# Patient Record
Sex: Female | Born: 1960
Health system: Southern US, Community
[De-identification: ages and names within clinical notes are randomized; demographics above are authoritative.]

## PROBLEM LIST (undated history)

## (undated) DIAGNOSIS — Z808 Family history of malignant neoplasm of other organs or systems: Secondary | ICD-10-CM

## (undated) DIAGNOSIS — T4145XA Adverse effect of unspecified anesthetic, initial encounter: Secondary | ICD-10-CM

## (undated) DIAGNOSIS — Z803 Family history of malignant neoplasm of breast: Secondary | ICD-10-CM

## (undated) DIAGNOSIS — F329 Major depressive disorder, single episode, unspecified: Secondary | ICD-10-CM

## (undated) DIAGNOSIS — N200 Calculus of kidney: Secondary | ICD-10-CM

## (undated) DIAGNOSIS — Z800 Family history of malignant neoplasm of digestive organs: Secondary | ICD-10-CM

## (undated) DIAGNOSIS — M26609 Unspecified temporomandibular joint disorder, unspecified side: Secondary | ICD-10-CM

## (undated) DIAGNOSIS — M8580 Other specified disorders of bone density and structure, unspecified site: Secondary | ICD-10-CM

## (undated) DIAGNOSIS — R200 Anesthesia of skin: Secondary | ICD-10-CM

## (undated) DIAGNOSIS — Z9889 Other specified postprocedural states: Secondary | ICD-10-CM

## (undated) DIAGNOSIS — C50919 Malignant neoplasm of unspecified site of unspecified female breast: Secondary | ICD-10-CM

## (undated) DIAGNOSIS — Z87442 Personal history of urinary calculi: Secondary | ICD-10-CM

## (undated) DIAGNOSIS — F411 Generalized anxiety disorder: Secondary | ICD-10-CM

## (undated) DIAGNOSIS — K5900 Constipation, unspecified: Secondary | ICD-10-CM

## (undated) DIAGNOSIS — T8859XA Other complications of anesthesia, initial encounter: Secondary | ICD-10-CM

## (undated) DIAGNOSIS — R112 Nausea with vomiting, unspecified: Secondary | ICD-10-CM

## (undated) DIAGNOSIS — M1990 Unspecified osteoarthritis, unspecified site: Secondary | ICD-10-CM

## (undated) DIAGNOSIS — R319 Hematuria, unspecified: Secondary | ICD-10-CM

## (undated) DIAGNOSIS — F909 Attention-deficit hyperactivity disorder, unspecified type: Secondary | ICD-10-CM

## (undated) HISTORY — DX: Calculus of kidney: N20.0

## (undated) HISTORY — PX: BREAST BIOPSY: SHX20

## (undated) HISTORY — DX: Unspecified osteoarthritis, unspecified site: M19.90

## (undated) HISTORY — DX: Family history of malignant neoplasm of breast: Z80.3

## (undated) HISTORY — PX: PELVIC LAPAROSCOPY: SHX162

## (undated) HISTORY — DX: Family history of malignant neoplasm of other organs or systems: Z80.8

## (undated) HISTORY — PX: CYSTOSCOPY: SUR368

## (undated) HISTORY — PX: LAPAROSCOPIC TOTAL HYSTERECTOMY: SUR800

## (undated) HISTORY — DX: Family history of malignant neoplasm of digestive organs: Z80.0

## (undated) HISTORY — DX: Other specified disorders of bone density and structure, unspecified site: M85.80

## (undated) HISTORY — DX: Major depressive disorder, single episode, unspecified: F32.9

## (undated) HISTORY — PX: COLPOSCOPY: SHX161

## (undated) HISTORY — DX: Attention-deficit hyperactivity disorder, unspecified type: F90.9

## (undated) HISTORY — DX: Unspecified temporomandibular joint disorder, unspecified side: M26.609

## (undated) HISTORY — DX: Malignant neoplasm of unspecified site of unspecified female breast: C50.919

## (undated) HISTORY — PX: GYNECOLOGIC CRYOSURGERY: SHX857

## (undated) HISTORY — PX: ABDOMINAL HYSTERECTOMY: SHX81

## (undated) HISTORY — DX: Hematuria, unspecified: R31.9

## (undated) HISTORY — DX: Generalized anxiety disorder: F41.1

---
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
## 1997-07-16 HISTORY — PX: AUGMENTATION MAMMAPLASTY: SUR837

## 1998-03-03 ENCOUNTER — Other Ambulatory Visit: Admission: RE | Admit: 1998-03-03 | Discharge: 1998-03-03 | Payer: Self-pay | Admitting: Obstetrics and Gynecology

## 1998-03-18 ENCOUNTER — Ambulatory Visit (HOSPITAL_COMMUNITY): Admission: RE | Admit: 1998-03-18 | Discharge: 1998-03-18 | Payer: Self-pay | Admitting: Internal Medicine

## 1999-04-17 ENCOUNTER — Other Ambulatory Visit: Admission: RE | Admit: 1999-04-17 | Discharge: 1999-04-17 | Payer: Self-pay | Admitting: Obstetrics and Gynecology

## 2000-07-19 ENCOUNTER — Emergency Department (HOSPITAL_COMMUNITY): Admission: EM | Admit: 2000-07-19 | Discharge: 2000-07-20 | Payer: Self-pay | Admitting: Emergency Medicine

## 2000-07-20 ENCOUNTER — Encounter: Payer: Self-pay | Admitting: Emergency Medicine

## 2001-02-11 ENCOUNTER — Other Ambulatory Visit: Admission: RE | Admit: 2001-02-11 | Discharge: 2001-02-11 | Payer: Self-pay | Admitting: Obstetrics and Gynecology

## 2002-03-24 ENCOUNTER — Other Ambulatory Visit: Admission: RE | Admit: 2002-03-24 | Discharge: 2002-03-24 | Payer: Self-pay | Admitting: Obstetrics and Gynecology

## 2003-05-10 ENCOUNTER — Other Ambulatory Visit: Admission: RE | Admit: 2003-05-10 | Discharge: 2003-05-10 | Payer: Self-pay | Admitting: Obstetrics and Gynecology

## 2006-07-16 HISTORY — PX: BREAST SURGERY: SHX581

## 2006-10-15 DIAGNOSIS — C50919 Malignant neoplasm of unspecified site of unspecified female breast: Secondary | ICD-10-CM

## 2006-10-15 HISTORY — DX: Malignant neoplasm of unspecified site of unspecified female breast: C50.919

## 2007-01-14 HISTORY — PX: MASTECTOMY: SHX3

## 2007-03-17 HISTORY — PX: TOTAL ABDOMINAL HYSTERECTOMY W/ BILATERAL SALPINGOOPHORECTOMY: SHX83

## 2008-04-27 ENCOUNTER — Ambulatory Visit: Payer: Self-pay | Admitting: Oncology

## 2008-05-18 LAB — CBC WITH DIFFERENTIAL/PLATELET
Basophils Absolute: 0 10*3/uL (ref 0.0–0.1)
Eosinophils Absolute: 0.1 10*3/uL (ref 0.0–0.5)
HCT: 39.6 % (ref 34.8–46.6)
LYMPH%: 37.1 % (ref 14.0–48.0)
MCV: 95.4 fL (ref 81.0–101.0)
MONO#: 0.4 10*3/uL (ref 0.1–0.9)
MONO%: 8.5 % (ref 0.0–13.0)
NEUT#: 2.5 10*3/uL (ref 1.5–6.5)
NEUT%: 52.6 % (ref 39.6–76.8)
Platelets: 270 10*3/uL (ref 145–400)
WBC: 4.8 10*3/uL (ref 3.9–10.0)

## 2008-05-19 LAB — COMPREHENSIVE METABOLIC PANEL
BUN: 12 mg/dL (ref 6–23)
CO2: 23 mEq/L (ref 19–32)
Creatinine, Ser: 0.66 mg/dL (ref 0.40–1.20)
Glucose, Bld: 94 mg/dL (ref 70–99)
Total Bilirubin: 0.4 mg/dL (ref 0.3–1.2)
Total Protein: 7.7 g/dL (ref 6.0–8.3)

## 2008-05-19 LAB — CANCER ANTIGEN 27.29: CA 27.29: 27 U/mL (ref 0–39)

## 2008-05-19 LAB — VITAMIN D 25 HYDROXY (VIT D DEFICIENCY, FRACTURES): Vit D, 25-Hydroxy: 42 ng/mL (ref 30–89)

## 2008-09-14 ENCOUNTER — Ambulatory Visit: Payer: Self-pay | Admitting: Oncology

## 2008-09-16 LAB — CBC WITH DIFFERENTIAL/PLATELET
Basophils Absolute: 0 10*3/uL (ref 0.0–0.1)
Eosinophils Absolute: 0 10*3/uL (ref 0.0–0.5)
HCT: 39.7 % (ref 34.8–46.6)
HGB: 13.7 g/dL (ref 11.6–15.9)
LYMPH%: 28.8 % (ref 14.0–49.7)
MCV: 95.2 fL (ref 79.5–101.0)
MONO#: 0.4 10*3/uL (ref 0.1–0.9)
NEUT#: 3.7 10*3/uL (ref 1.5–6.5)
NEUT%: 63.2 % (ref 38.4–76.8)
Platelets: 243 10*3/uL (ref 145–400)
RBC: 4.17 10*6/uL (ref 3.70–5.45)
WBC: 5.9 10*3/uL (ref 3.9–10.3)

## 2008-09-17 LAB — VITAMIN D 25 HYDROXY (VIT D DEFICIENCY, FRACTURES): Vit D, 25-Hydroxy: 28 ng/mL — ABNORMAL LOW (ref 30–89)

## 2008-09-17 LAB — COMPREHENSIVE METABOLIC PANEL
BUN: 17 mg/dL (ref 6–23)
CO2: 26 mEq/L (ref 19–32)
Glucose, Bld: 76 mg/dL (ref 70–99)
Sodium: 141 mEq/L (ref 135–145)
Total Bilirubin: 0.3 mg/dL (ref 0.3–1.2)
Total Protein: 7 g/dL (ref 6.0–8.3)

## 2008-09-17 LAB — CANCER ANTIGEN 27.29: CA 27.29: 25 U/mL (ref 0–39)

## 2009-02-03 ENCOUNTER — Ambulatory Visit: Payer: Self-pay | Admitting: Oncology

## 2009-12-07 ENCOUNTER — Encounter: Admission: RE | Admit: 2009-12-07 | Discharge: 2009-12-07 | Payer: Self-pay | Admitting: Oncology

## 2010-01-27 ENCOUNTER — Ambulatory Visit: Payer: Self-pay | Admitting: Oncology

## 2010-01-31 LAB — CBC WITH DIFFERENTIAL/PLATELET
BASO%: 0.3 % (ref 0.0–2.0)
Basophils Absolute: 0 10*3/uL (ref 0.0–0.1)
EOS%: 1 % (ref 0.0–7.0)
Eosinophils Absolute: 0 10*3/uL (ref 0.0–0.5)
HCT: 40.1 % (ref 34.8–46.6)
HGB: 13.4 g/dL (ref 11.6–15.9)
LYMPH%: 41.7 % (ref 14.0–49.7)
MCH: 32.6 pg (ref 25.1–34.0)
MCHC: 33.3 g/dL (ref 31.5–36.0)
MCV: 97.8 fL (ref 79.5–101.0)
MONO#: 0.4 10*3/uL (ref 0.1–0.9)
MONO%: 9.6 % (ref 0.0–14.0)
NEUT#: 2.1 10*3/uL (ref 1.5–6.5)
NEUT%: 47.4 % (ref 38.4–76.8)
Platelets: 227 10*3/uL (ref 145–400)
RBC: 4.1 10*6/uL (ref 3.70–5.45)
RDW: 13.6 % (ref 11.2–14.5)
WBC: 4.4 10*3/uL (ref 3.9–10.3)
lymph#: 1.8 10*3/uL (ref 0.9–3.3)

## 2010-02-01 LAB — COMPREHENSIVE METABOLIC PANEL
ALT: 15 U/L (ref 0–35)
AST: 22 U/L (ref 0–37)
Albumin: 4.5 g/dL (ref 3.5–5.2)
Alkaline Phosphatase: 40 U/L (ref 39–117)
BUN: 17 mg/dL (ref 6–23)
CO2: 23 mEq/L (ref 19–32)
Calcium: 10 mg/dL (ref 8.4–10.5)
Chloride: 105 mEq/L (ref 96–112)
Creatinine, Ser: 0.84 mg/dL (ref 0.40–1.20)
Glucose, Bld: 95 mg/dL (ref 70–99)
Potassium: 3.9 mEq/L (ref 3.5–5.3)
Sodium: 141 mEq/L (ref 135–145)
Total Bilirubin: 0.3 mg/dL (ref 0.3–1.2)
Total Protein: 7 g/dL (ref 6.0–8.3)

## 2010-02-01 LAB — CANCER ANTIGEN 27.29: CA 27.29: 16 U/mL (ref 0–39)

## 2010-02-01 LAB — VITAMIN D 25 HYDROXY (VIT D DEFICIENCY, FRACTURES): Vit D, 25-Hydroxy: 47 ng/mL (ref 30–89)

## 2010-03-10 DIAGNOSIS — Z9010 Acquired absence of unspecified breast and nipple: Secondary | ICD-10-CM | POA: Insufficient documentation

## 2010-03-10 DIAGNOSIS — C50919 Malignant neoplasm of unspecified site of unspecified female breast: Secondary | ICD-10-CM | POA: Insufficient documentation

## 2010-03-10 DIAGNOSIS — R002 Palpitations: Secondary | ICD-10-CM | POA: Insufficient documentation

## 2010-03-10 DIAGNOSIS — M19049 Primary osteoarthritis, unspecified hand: Secondary | ICD-10-CM | POA: Insufficient documentation

## 2010-03-17 ENCOUNTER — Ambulatory Visit: Payer: Self-pay | Admitting: Cardiology

## 2010-03-17 DIAGNOSIS — R079 Chest pain, unspecified: Secondary | ICD-10-CM | POA: Insufficient documentation

## 2010-03-17 DIAGNOSIS — I440 Atrioventricular block, first degree: Secondary | ICD-10-CM | POA: Insufficient documentation

## 2010-04-03 ENCOUNTER — Ambulatory Visit: Payer: Self-pay | Admitting: Internal Medicine

## 2010-04-03 ENCOUNTER — Ambulatory Visit (HOSPITAL_COMMUNITY): Admission: RE | Admit: 2010-04-03 | Discharge: 2010-04-03 | Payer: Self-pay | Admitting: Cardiology

## 2010-04-03 ENCOUNTER — Encounter: Payer: Self-pay | Admitting: Cardiology

## 2010-04-03 ENCOUNTER — Ambulatory Visit: Payer: Self-pay

## 2010-08-17 NOTE — Assessment & Plan Note (Signed)
Summary: np6 / palps. pt has inclusive health./ gd   Visit Type:  new pt visit Primary Provider:  [facility]  CC:  pt states she has had some recent chest pain/pressure...sob....denies any edema.  History of Present Illness: Kristen Gamble consent today for evaluation of some palpitations described as flutters and some chest discomfort.  She is referred by Dr. Darnelle Catalan.   She also complains of excess fatigue.  Her chest discomfort is typically nonexertional a when she is exercising she will feel a discomfort in her left breast. There are no associated symptoms of nausea, vomiting, radiation the discomfort, presyncope, syncope, or diaphoresis.  She's been a lot of stress with 4 children and also diagnosed with breast cancer several years ago. She exercises on a regular basis. She has no limitations exercise.  There is no history of diabetes, hypertension, or premature family history of coronary disease. Her cholesterol is 181, HDL 83, LDL 84 triglycerides 39.  The palpitations are intermittent and mostly at night or rest. They're not associated with exertion. There is no particular pattern and they may last seconds to minutes. No associated symptoms. Her TSH was normal in February.    Preventive Screening-Counseling & Management  Alcohol-Tobacco     Smoking Status: never  Current Medications (verified): 1)  Tamoxifen Citrate 20 Mg Tabs (Tamoxifen Citrate) .Marland Kitchen.. 1 Tab Once Daily 2)  Gabapentin 300 Mg Caps (Gabapentin) .Marland Kitchen.. 1 Cap At Bedtime 3)  Miralax  Powd (Polyethylene Glycol 3350) .Marland KitchenMarland KitchenMarland Kitchen 17 Gm Once Daily 4)  Vitamin D3 1000 Unit Tabs (Cholecalciferol) .Marland Kitchen.. 1 Tab Once Daily 5)  Calcium 600 Mg Tabs (Calcium) .Marland Kitchen.. 1 Tab Two Times A Day  Allergies (verified): 1)  ! Morphine  Past History:  Past Medical History: Last updated: 03/10/2010 Right breast cancer history..T1c N0, grade 1 Invasive ductal carcinoma, ER and PR strongly positive, HER2/neu negative, Ocotype DX recurrent  score of 13, predicting an 8% or so risk of recurrence after 5 yrs of Tamoxifen Bilateral Mastecomies...2008 Subpectoral implants...2008 Arthritis hands  Family History: Last updated: 03/17/2010 Family History or Thyroid problems Father: deceased @ 84...alcoholic-pneumonia Family History of Alcoholism: Father Family History of Coronary Artery Disease: PGF.Marland Kitchendeceased @ 68 due to MI...MGM MI at 64  Social History: Last updated: 03/17/2010 Alcohol Use - yes...4 drinks per week Regular Exercise - yes Drug Use - no 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  Smoking Status:  never  Review of Systems       negative other than history of present illness  Vital Signs:  Patient profile:   50 year old female Height:      63.5 inches Weight:      122.8 pounds BMI:     21.49 Pulse rate:   57 / minute Pulse rhythm:   irregular BP sitting:   100 / 70  (left arm) Cuff size:   large  Vitals Entered By: Danielle Rankin, CMA (  March 17, 2010 10:35 AM)  Physical Exam  General:  Well developed, well nourished, in no acute distress. Head:  normocephalic and atraumatic Eyes:  PERRLA/EOM intact; conjunctiva and lids normal. Neck:  Neck supple, no JVD. No masses, thyromegaly or abnormal cervical nodes. Chest Wall:  no  deformities or breast masses noted Lungs:  Clear bilaterally to auscultation and percussion. Heart:  PMI nondisplaced, normal S1-S2, regular rate and rhythm, no click or murmur, carotids equal bilaterally without bruits Abdomen:  Bowel sounds positive; abdomen soft and non-tender without masses, organomegaly, or hernias noted. No hepatosplenomegaly. Msk:  Back normal, normal gait. Muscle strength and tone normal. Pulses:  pulses normal in all 4 extremities Extremities:  No clubbing or cyanosis. Neurologic:  Alert and oriented x 3. Skin:  Intact without lesions or rashes. Psych:  Normal affect.   Problems:  Medical Problems Added: 1)  Dx of Chest Pain-unspecified  (ICD-786.50) 2)  Dx of Av Block, 1st Degree  (ICD-426.11)  EKG  Procedure date:  03/17/2010  Findings:      sinus bradycardia, first-degree AV block, no other changes.  Impression & Recommendations:  Problem # 1:  PALPITATIONS (ICD-785.1) Assessment New I feel these are most likely benign due to PACs or occasional PVCs. TSH and electrolytes are normal. Will check echocardiogram to rule out a structural heart disease. I suspect this will be normal as well. Reassurance given. Orders: EKG w/ Interpretation (93000) Echocardiogram (Echo)  Problem # 2:  CHEST PAIN-UNSPECIFIED (ICD-786.50) Assessment: New I do not feel this is coronary or cardiac related. Symptoms of angina or ischemia reviewed with the patient. Her risk of having a coronary event in the next 10 years is around 1% per global risk score. Data shared with the patient. Reassurance given.  Problem # 3:  AV BLOCK, 1ST DEGREE (ICD-426.11) Assessment: New This is an incidental finding. Patient made aware of no significant concern. Orders: EKG w/ Interpretation (93000)  Patient Instructions: 1)  Your physician recommends that you schedule a follow-up appointment in: as needed with Dr. Daleen Squibb 2)  Your physician recommends that you continue on your current  medications as directed. Please refer to the Current Medication list given to you today. 3)  Your physician has requested that you have an echocardiogram.  Echocardiography is a painless test that uses sound waves to create images of your heart. It provides your doctor with information about the size and shape of your heart and how well your heart's chambers and valves are working.  This procedure takes approximately one hour. There are no restrictions for this procedure.

## 2011-02-01 ENCOUNTER — Encounter (HOSPITAL_BASED_OUTPATIENT_CLINIC_OR_DEPARTMENT_OTHER): Payer: PRIVATE HEALTH INSURANCE | Admitting: Oncology

## 2011-02-01 ENCOUNTER — Other Ambulatory Visit: Payer: Self-pay | Admitting: Oncology

## 2011-02-01 DIAGNOSIS — Z170 Estrogen receptor positive status [ER+]: Secondary | ICD-10-CM

## 2011-02-01 DIAGNOSIS — C50919 Malignant neoplasm of unspecified site of unspecified female breast: Secondary | ICD-10-CM

## 2011-02-01 DIAGNOSIS — M899 Disorder of bone, unspecified: Secondary | ICD-10-CM

## 2011-02-01 LAB — CBC WITH DIFFERENTIAL/PLATELET
BASO%: 0.4 % (ref 0.0–2.0)
Basophils Absolute: 0 10*3/uL (ref 0.0–0.1)
EOS%: 1.3 % (ref 0.0–7.0)
Eosinophils Absolute: 0.1 10*3/uL (ref 0.0–0.5)
HCT: 40.5 % (ref 34.8–46.6)
HGB: 13.8 g/dL (ref 11.6–15.9)
LYMPH%: 36.2 % (ref 14.0–49.7)
MCH: 32.5 pg (ref 25.1–34.0)
MCHC: 34.1 g/dL (ref 31.5–36.0)
MCV: 95.4 fL (ref 79.5–101.0)
MONO#: 0.3 10*3/uL (ref 0.1–0.9)
MONO%: 7.3 % (ref 0.0–14.0)
NEUT#: 2.3 10*3/uL (ref 1.5–6.5)
NEUT%: 54.8 % (ref 38.4–76.8)
Platelets: 225 10*3/uL (ref 145–400)
RBC: 4.24 10*6/uL (ref 3.70–5.45)
RDW: 13.2 % (ref 11.2–14.5)
WBC: 4.2 10*3/uL (ref 3.9–10.3)
lymph#: 1.5 10*3/uL (ref 0.9–3.3)

## 2011-02-02 LAB — CANCER ANTIGEN 27.29: CA 27.29: 24 U/mL (ref 0–39)

## 2011-02-02 LAB — COMPREHENSIVE METABOLIC PANEL
ALT: 20 U/L (ref 0–35)
AST: 21 U/L (ref 0–37)
Alkaline Phosphatase: 64 U/L (ref 39–117)
CO2: 27 mEq/L (ref 19–32)
Creatinine, Ser: 0.8 mg/dL (ref 0.50–1.10)
Total Bilirubin: 0.4 mg/dL (ref 0.3–1.2)

## 2011-02-08 ENCOUNTER — Encounter (HOSPITAL_BASED_OUTPATIENT_CLINIC_OR_DEPARTMENT_OTHER): Payer: PRIVATE HEALTH INSURANCE | Admitting: Oncology

## 2011-02-08 DIAGNOSIS — C50919 Malignant neoplasm of unspecified site of unspecified female breast: Secondary | ICD-10-CM

## 2011-02-08 DIAGNOSIS — Z170 Estrogen receptor positive status [ER+]: Secondary | ICD-10-CM

## 2011-06-23 ENCOUNTER — Telehealth: Payer: Self-pay | Admitting: Oncology

## 2011-06-23 NOTE — Telephone Encounter (Signed)
called pts home daughter answered phone and stated that mother was not there.  will mail [phone] appts to her home on 06/23/2011

## 2011-08-13 ENCOUNTER — Encounter: Payer: Self-pay | Admitting: Gynecology

## 2011-08-13 ENCOUNTER — Other Ambulatory Visit: Payer: Self-pay | Admitting: Gynecology

## 2011-08-13 ENCOUNTER — Other Ambulatory Visit (HOSPITAL_COMMUNITY)
Admission: RE | Admit: 2011-08-13 | Discharge: 2011-08-13 | Disposition: A | Discharge status: Home / Self Care | Payer: PRIVATE HEALTH INSURANCE | Source: Ambulatory Visit | Attending: Gynecology | Admitting: Gynecology

## 2011-08-13 ENCOUNTER — Ambulatory Visit (INDEPENDENT_AMBULATORY_CARE_PROVIDER_SITE_OTHER): Payer: PRIVATE HEALTH INSURANCE | Admitting: Gynecology

## 2011-08-13 VITALS — BP 114/60 | Ht 63.5 in | Wt 130.0 lb

## 2011-08-13 DIAGNOSIS — Z01419 Encounter for gynecological examination (general) (routine) without abnormal findings: Secondary | ICD-10-CM | POA: Insufficient documentation

## 2011-08-13 DIAGNOSIS — C801 Malignant (primary) neoplasm, unspecified: Secondary | ICD-10-CM | POA: Insufficient documentation

## 2011-08-13 LAB — URINALYSIS W MICROSCOPIC + REFLEX CULTURE
Bilirubin Urine: NEGATIVE
Crystals: NONE SEEN
Glucose, UA: NEGATIVE mg/dL
Specific Gravity, Urine: 1.015 (ref 1.005–1.030)
Urobilinogen, UA: 0.2 mg/dL (ref 0.0–1.0)

## 2011-08-13 NOTE — Patient Instructions (Signed)
Follow up for GYN exam in 1 year. Call if you want to try Effexor for hot flashes.

## 2011-08-13 NOTE — Progress Notes (Signed)
Kristen Gamble [birth year] [id number]        51 y.o.  New patient for annual exam.  Several issues noted below.  Past medical history,surgical history, medications, allergies, family history and social history were all reviewed and documented in the EPIC chart. ROS:  Was performed and pertinent positives and negatives are included in the history.  Exam: Kim chaperone present Filed Vitals:   08/13/11 1117  BP: 114/60   General appearance  Normal Skin grossly normal Head/Neck normal with no cervical or supraclavicular adenopathy thyroid normal Lungs  clear Cardiac RR, without RMG Abdominal  soft, nontender, without masses, organomegaly or hernia Chest wall  examined lying and sitting.   Well-healed reconstruction scars.  Without masses, skin changes or axillary adenopathy.   Pelvic  Ext/BUS/vagina  Normal with mild atrophic changes.  Pap of cuff done  Adnexa  Without masses or tenderness    Anus and perineum  normal   Rectovaginal  normal sphincter tone without palpated masses or tenderness.    Assessment/Plan:  51 y.o. female for annual exam.    1. History of adenocarcinoma the right breast status post bilateral mastectomy. Stage I historically, estrogen progesterone receptor positive. Had been on tamoxifen and aromatase inhibitors but now off of these do to side effect profile. She is actively being followed by Dr. Darnelle Catalan and will continue to see him. 2. History of dysplasia. Cryo- surgery age 35 with normal Pap smears since then. Review of her records show normal sequential Pap smears with the last one in 2011. She is status post TAH/BSO as part of a risk reduction surgery and I reviewed current screening guidelines as far as stopping after hysterectomy or doing a less frequent interval. Patient asked for a Pap smear this year and we'll rediscuss less frequent screening in the future. 3. Menopausal symptoms. Patient is having hot flashes and sweats. She's doing all right as far as vaginal  lubrication with intercourse. She is estrogen and progesterone positive on her breast cancer. Options for management were reviewed and I discussed possible Effexor. She wants to think about this and she'll let me know if she wants to try this. 4. Osteopenia. She is known to have osteopenia on bone density through Dr. Darrall Dears office. She'll continue to follow up with him in reference to this. 5. Colonoscopy. She had a colonoscopy at age 27 due to some GI complaints which was negative. She plans on repeat after she turns 51 this coming year. 6. Health maintenance. No blood work was done today as is all done through her primary's office. I did check a urinalysis. Assuming she continues well then she will see me in a year or sooner if she wants to try Effexor.    Dara Lords MD, 12:25 PM 08/13/2011

## 2011-08-15 LAB — URINE CULTURE: Organism ID, Bacteria: NO GROWTH

## 2011-08-17 DIAGNOSIS — F909 Attention-deficit hyperactivity disorder, unspecified type: Secondary | ICD-10-CM

## 2011-08-17 HISTORY — DX: Attention-deficit hyperactivity disorder, unspecified type: F90.9

## 2011-08-22 ENCOUNTER — Other Ambulatory Visit: Payer: Self-pay | Admitting: *Deleted

## 2011-08-22 MED ORDER — TAMOXIFEN CITRATE 20 MG PO TABS: 20.0000 mg | ORAL_TABLET | Freq: Every day | ORAL | Status: DC

## 2011-08-31 ENCOUNTER — Encounter: Payer: Self-pay | Admitting: *Deleted

## 2011-08-31 NOTE — Progress Notes (Unsigned)
Pt reports that she has recently started to take Tamoxifen again and after a 3 days has noted a "itchy rash" on her neck. Pt states that she took Tamoxifen a couple of years ago with no problems. Pt denies SOB, and fever. Pt was instructed to either stop the Tamoxifen and evaluate sx after a few days or she may take benadryl for "itchy rash" if sx do not improve by Monday 09/03/11 pt will call this desk

## 2011-09-03 ENCOUNTER — Encounter: Payer: PRIVATE HEALTH INSURANCE | Admitting: Family Medicine

## 2011-09-17 ENCOUNTER — Encounter: Payer: Self-pay | Admitting: Family Medicine

## 2011-09-17 ENCOUNTER — Ambulatory Visit (INDEPENDENT_AMBULATORY_CARE_PROVIDER_SITE_OTHER): Payer: PRIVATE HEALTH INSURANCE | Admitting: Family Medicine

## 2011-09-17 VITALS — BP 102/62 | HR 68 | Ht 63.25 in | Wt 127.0 lb

## 2011-09-17 DIAGNOSIS — Z0000 Encounter for general adult medical examination without abnormal findings: Secondary | ICD-10-CM

## 2011-09-17 DIAGNOSIS — R232 Flushing: Secondary | ICD-10-CM | POA: Insufficient documentation

## 2011-09-17 DIAGNOSIS — C50919 Malignant neoplasm of unspecified site of unspecified female breast: Secondary | ICD-10-CM

## 2011-09-17 DIAGNOSIS — E7800 Pure hypercholesterolemia, unspecified: Secondary | ICD-10-CM

## 2011-09-17 DIAGNOSIS — J309 Allergic rhinitis, unspecified: Secondary | ICD-10-CM | POA: Insufficient documentation

## 2011-09-17 DIAGNOSIS — N951 Menopausal and female climacteric states: Secondary | ICD-10-CM

## 2011-09-17 LAB — LIPID PANEL
Cholesterol: 220 mg/dL — ABNORMAL HIGH (ref 0–200)
HDL: 94 mg/dL (ref >39)
LDL Cholesterol: 116 mg/dL — ABNORMAL HIGH (ref 0–99)
Triglycerides: 51 mg/dL (ref <150)

## 2011-09-17 LAB — POCT URINALYSIS DIPSTICK
Bilirubin, UA: NEGATIVE
Glucose, UA: NEGATIVE
Ketones, UA: NEGATIVE
Leukocytes, UA: NEGATIVE
Nitrite, UA: NEGATIVE
pH, UA: 7

## 2011-09-17 MED ORDER — VENLAFAXINE HCL ER 37.5 MG PO CP24: ORAL_CAPSULE | ORAL | Status: DC

## 2011-09-17 NOTE — Progress Notes (Signed)
Kristen Gamble is a 51 y.o. female who presents for a complete physical.  She has the following concerns:  Recently had evaluation by psychologist Caryl Never) showing ADHD, and recommended that she see Dr. Merla Riches for possible medications. She finds that she focuses better if she has caffeine or a decongestant such as sudafed.  Has seasonal allergies, and they have been flaring.  Runny nose, occasional sinus pressure. Reports sometimes having trouble sleeping, mainly related to hot flashes, and some hormonal changes.  Had eval by cardiologist for some chest pain, flutters.  Had normal workup, was felt to be due to Tamoxifen.  Symptoms are mild.  Had been off Tamoxifen for 18 months, and recently restarted taking daily, and tolerating so far.  Health Maintenance:  There is no immunization history on file for this patient. Believes last tetanus was >5 years ago, and not TdaP; more likely 8-9 years ago Last Pap smear: with GYN last month Last mammogram: n/a since mastectomy, has had u/s with lump evals Last colonoscopy: 2008, rec repeat in 5 years, plans for age 71 (later this year); Dr. Matthias Hughs Last DEXA: 03/2011, osteopenia Dentist: twice yearly Ophtho: once yearly Exercise: 5-7 days/week, weights 2x/week  Past Medical History  Diagnosis Date  . Breast cancer 10/2006    left  . Cervical dysplasia   . Osteopenia   . ADHD (attention deficit hyperactivity disorder) 08/2011    Past Surgical History  Procedure Date  . Colposcopy   . Gynecologic cryosurgery   . Breast surgery 2008    Lumpectomy-Bilateral mastectomy-implants  . Augmentation mammaplasty 1999  . Total abdominal hysterectomy w/ bilateral salpingoophorectomy 9/08  . Tonsillectomy 1971  . Breast biopsy     x4 in 2009-2012, all benign    History   Social History  . Marital Status: Married    Spouse Name: N/A    Number of Children: 4  . Years of Education: N/A   Occupational History  . 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 Smoking status: Never Smoker   . Smokeless tobacco: Never Used  . Alcohol Use: 2.5 oz/week    5 drink(s) per week     1 glass of wine 4-5 times week.  . Drug Use: No  . Sexually Active: Yes -- Female partner(s)    Birth Control/ Protection: Surgical   Other Topics Concern  . Not on file   Social History Narrative   Lives with her husband, 53 yo daughter and 79 year old twins, 2 dogs, 2 birds    Family History  Problem Relation Age of Onset  . Diabetes Father   . Cancer Father     stomach  . Alcohol abuse Father   . Depression Father   . Ulcers Father   . Diabetes Maternal Grandmother   . Hypertension Maternal Grandmother   . Heart attack Maternal Grandmother   . Heart disease Maternal Grandmother   . Hypothyroidism Maternal Grandmother   . Hypertension Maternal Grandfather   . Stroke Maternal Grandfather   . Asthma Maternal Grandfather   . Colon polyps Maternal Grandfather   . Heart attack Paternal Grandfather   . Heart disease Paternal Grandfather 65    died of MI  . Arthritis Mother   . Hypothyroidism Mother    Current Outpatient Prescriptions on File Prior to Visit  Medication Sig Dispense Refill  . Polyethylene Glycol 3350 (MIRALAX PO) Take by mouth daily.       Tamoxifen, Viactiv BID  Allergies  Allergen Reactions  . Morphine     REACTION: itching   ROS:  The patient denies anorexia, fever, weight changes, headaches,  vision changes, decreased hearing, ear pain, sore throat, breast concerns, chest pain, palpitations, dizziness, syncope, dyspnea on exertion, cough, swelling, nausea, vomiting, diarrhea, constipation, abdominal pain, melena, hematochezia, indigestion/heartburn, hematuria, incontinence, dysuria, irregular menstrual cycles, vaginal discharge, odor or itch, genital lesions, joint pains, numbness, tingling, weakness, tremor, suspicious skin lesions, depression, anxiety, abnormal bleeding/bruising, or enlarged lymph  nodes.  +hot flashes at night. +concentration problems.  Periodically has been evaluated for lumps in scars from breast surgery, had u/s and biopsy, benign.  PHYSICAL EXAM: BP 102/62  Pulse 68  Ht 5' 3.25" (1.607 m)  Wt 127 lb (57.607 kg)  BMI 22.32 kg/m2  General Appearance:    Alert, cooperative, no distress, appears stated age  Head:    Normocephalic, without obvious abnormality, atraumatic  Eyes:    PERRL, conjunctiva/corneas clear, EOM's intact, fundi    benign  Ears:    Normal TM's and external ear canals  Nose:   Nares normal, mucosa mildly edematous with clear mucus, recent bleeding from L nare, no sinus tenderness  Throat:   Lips, mucosa, and tongue normal; teeth and gums normal  Neck:   Supple, no lymphadenopathy;  thyroid:  no   enlargement/tenderness/nodules; no carotid   bruit or JVD  Back:    Spine nontender, no curvature, ROM normal, no CVA     tenderness  Lungs:     Clear to auscultation bilaterally without wheezes, rales or     ronchi; respirations unlabored  Chest Wall:    No tenderness or deformity   Heart:    Regular rate and rhythm, S1 and S2 normal, no murmur, rub   or gallop  Breast Exam:    Deferred to GYN  Abdomen:     Soft, non-tender, nondistended, normoactive bowel sounds,    no masses, no hepatosplenomegaly  Genitalia:    Deferred to GYN     Extremities:   No clubbing, cyanosis or edema  Pulses:   2+ and symmetric all extremities  Skin:   Skin color, texture, turgor normal, no rashes or lesions  Lymph nodes:   Cervical, supraclavicular, and axillary nodes normal  Neurologic:   CNII-XII intact, normal strength, sensation and gait; reflexes 2+ and symmetric throughout          Psych:   Normal mood, affect, hygiene and grooming.     ASSESSMENT/PLAN: 1. Routine general medical examination at a health care facility  POCT Urinalysis Dipstick, Visual acuity screening, Glucose, random  2. Hot flashes  TSH, venlafaxine (EFFEXOR XR) 37.5 MG 24 hr capsule    3. Allergic rhinitis, cause unspecified    4. Pure hypercholesterolemia  Lipid panel  5. ADENOCARCINOMA, RIGHT BREAST     Hot flashes due to meds (Tamoxifen) Allergies--may contribute to concentration difficulties ADHD--recently determined by psychologist.  Has other factors affecting her ability to concentrate (sleep, hot flashes, allergies), so avoid stimulant medications for now Insomnia--related to hot flashes  Trial of treating hot flashes and allergies, and getting better sleep, to see if attention/concentration improves.  If still having trouble focusing, consider adding stimulant medication to treat ADHD.  Effexor 37.5 mg x 1-2 weeks, increase to 75 mg if tolerating and still having hot flashes.  Try melatonin or benadryl (diphenhydramine--ie in Simply Sleep) to see if improving your sleep improves concentration, versus restarting gabapentin, which has helped you sleep in the past.  Treating allergies will also help with concentration.  Start with Zyrtec or Claritin (you may use the D version if the plain doesn't adequately treat the congestion or sinus pressure). If you are sensitive to decongestants with your sleep, then do NOT take combination, but take a separate, shorter-acting decongestant only as needed.   Recommended at least 30 minutes of aerobic activity at least 5 days/week; proper sunscreen use reviewed; healthy diet, including goals of calcium and vitamin D intake and alcohol recommendations (less than or equal to 1 drink/day) reviewed; regular seatbelt use; changing batteries in smoke detectors.  Immunization recommendations discussed--TdaP given.  Colonoscopy recommendations reviewed--repeat due again this year, age 13  Labs/chart reviewed--chem in July wasn't fasting, normal vit D in July  TSH, fasting glucose, lipid  F/u 1 month

## 2011-09-17 NOTE — Patient Instructions (Addendum)
HEALTH MAINTENANCE RECOMMENDATIONS:  It is recommended that you get at least 30 minutes of aerobic exercise at least 5 days/week (for weight loss, you may need as much as 60-90 minutes). This can be any activity that gets your heart rate up. This can be divided in 10-15 minute intervals if needed, but try and build up your endurance at least once a week.  Weight bearing exercise is also recommended twice weekly.  Eat a healthy diet with lots of vegetables, fruits and fiber.  "Colorful" foods have a lot of vitamins (ie green vegetables, tomatoes, red peppers, etc).  Limit sweet tea, regular sodas and alcoholic beverages, all of which has a lot of calories and sugar.  Up to 1 alcoholic drink daily may be beneficial for women (unless trying to lose weight, watch sugars).  Drink a lot of water.  Calcium recommendations are 1200-1500 mg daily (1500 mg for postmenopausal women or women without ovaries), and vitamin D 1000 IU daily.  This should be obtained from diet and/or supplements (vitamins), and calcium should not be taken all at once, but in divided doses.  Monthly self breast exams and yearly mammograms for women over the age of 25 is recommended.  Sunscreen of at least SPF 30 should be used on all sun-exposed parts of the skin when outside between the hours of 10 am and 4 pm (not just when at beach or pool, but even with exercise, golf, tennis, and yard work!)  Use a sunscreen that says "broad spectrum" so it covers both UVA and UVB rays, and make sure to reapply every 1-2 hours.  Remember to change the batteries in your smoke detectors when changing your clock times in the spring and fall.  Use your seat belt every time you are in a car, and please drive safely and not be distracted with cell phones and texting while driving.   Trial of treating hot flashes and allergies, and getting better sleep, to see if attention/concentration improves.  If still having trouble focusing, consider adding  stimulant medication to treat ADHD.  Effexor 37.5 mg x 1-2 weeks, increase to 75 mg if tolerating and still having hot flashes.  Try melatonin or benadryl (diphenhydramine--ie in Simply Sleep) to see if improving your sleep improves concentration, versus restarting gabapentin, which has helped you sleep in the past.  Treating allergies will also help with concentration.  Start with Zyrtec or Claritin (you may use the D version if the plain doesn't adequately treat the congestion or sinus pressure). If you are sensitive to decongestants with your sleep, then do NOT take combination, but take a separate, shorter-acting decongestant only as needed.

## 2011-09-18 ENCOUNTER — Encounter: Payer: Self-pay | Admitting: Family Medicine

## 2011-09-19 ENCOUNTER — Ambulatory Visit: Payer: Self-pay | Admitting: Internal Medicine

## 2011-09-28 ENCOUNTER — Telehealth: Payer: Self-pay | Admitting: Family Medicine

## 2011-10-01 NOTE — Telephone Encounter (Signed)
DONE

## 2011-10-22 ENCOUNTER — Encounter: Payer: Self-pay | Admitting: Family Medicine

## 2011-10-22 ENCOUNTER — Ambulatory Visit (INDEPENDENT_AMBULATORY_CARE_PROVIDER_SITE_OTHER): Payer: PRIVATE HEALTH INSURANCE | Admitting: Family Medicine

## 2011-10-22 VITALS — BP 88/52 | HR 72 | Ht 63.25 in | Wt 124.0 lb

## 2011-10-22 DIAGNOSIS — F909 Attention-deficit hyperactivity disorder, unspecified type: Secondary | ICD-10-CM | POA: Insufficient documentation

## 2011-10-22 DIAGNOSIS — M26609 Unspecified temporomandibular joint disorder, unspecified side: Secondary | ICD-10-CM | POA: Insufficient documentation

## 2011-10-22 DIAGNOSIS — Z23 Encounter for immunization: Secondary | ICD-10-CM

## 2011-10-22 DIAGNOSIS — J309 Allergic rhinitis, unspecified: Secondary | ICD-10-CM

## 2011-10-22 DIAGNOSIS — R232 Flushing: Secondary | ICD-10-CM

## 2011-10-22 DIAGNOSIS — N951 Menopausal and female climacteric states: Secondary | ICD-10-CM

## 2011-10-22 MED ORDER — VENLAFAXINE HCL ER 75 MG PO CP24: 75.0000 mg | ORAL_CAPSULE | Freq: Every day | ORAL | Status: DC

## 2011-10-22 MED ORDER — FLUTICASONE PROPIONATE 50 MCG/ACT NA SUSP: 2.0000 | Freq: Every day | NASAL | Status: DC

## 2011-10-22 NOTE — Patient Instructions (Signed)
I have changed your Effexor to the 75 mg so that you only take 1 capsule daily. Consider trying different antihistamines (zyrtec vs claritin vs allegra), and you may use the D version or take a separate sudafed if needed for significant congestion or sinus pain.

## 2011-10-22 NOTE — Progress Notes (Signed)
Patient presents for 1 month follow up.  At her recent CPE we discussed her difficulty concentrating, which was felt to be contributed by allergies, poor sleep (related to hot flashes), and possible ADD (she had been recently diagnosed).  She was started on Effexor at last visit, as well as OTC allergy medications, and presents for follow up.  She is sleeping much better, although still having some hot flashes.  Feels less irritable.  Concentration is much better.  Allegra helps with allergies, but still having some congestion, postnasal drainage.    Slight upset stomach when she first started the Effexor, but no problems since. Had a headache one day when she took it later.  Recommended TdaP at last visit, but it wasn't given, so will get it today.  Hand swelled recently from a bee sting at a soccer game.  Didn't have any shortness of breath, tongue swelling.  Has had multiple bee stings, localized swelling only, although slightly worse with last sting.  TMJ--rx'd ibuprofen, and wears bite guard, and has had PT, doing home exercises.  Having a flare now, has been well controlled with PT in the past.  Past Medical History  Diagnosis Date  . Breast cancer 10/2006    left  . Cervical dysplasia   . Osteopenia   . ADHD (attention deficit hyperactivity disorder) 08/2011    Past Surgical History  Procedure Date  . Colposcopy   . Gynecologic cryosurgery   . Breast surgery 2008    Lumpectomy-Bilateral mastectomy-implants  . Augmentation mammaplasty 1999  . Total abdominal hysterectomy w/ bilateral salpingoophorectomy 9/08  . Tonsillectomy 1971  . Breast biopsy     x4 in 2009-2012, all benign    History   Social History  . Marital Status: Married    Spouse Name: N/A    Number of Children: 4  . 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 Smoking status: Never Smoker   . Smokeless tobacco: Never Used  . Alcohol  Use: 2.5 oz/week    5 drink(s) per week     1 glass of wine 4-5 times week.  . Drug Use: No  . Sexually Active: Yes -- Female partner(s)    Birth Control/ Protection: Surgical   Other Topics Concern  . Not on file   Social History Narrative   Lives with her husband, 34 yo daughter and 2 year old twins, 2 dogs, 2 birds    Family History  Problem Relation Age of Onset  . Diabetes Father   . Cancer Father     stomach  . Alcohol abuse Father   . Depression Father   . Ulcers Father   . Diabetes Maternal Grandmother   . Hypertension Maternal Grandmother   . Heart attack Maternal Grandmother   . Heart disease Maternal Grandmother   . Hypothyroidism Maternal Grandmother   . Hypertension Maternal Grandfather   . Stroke Maternal Grandfather   . Asthma Maternal Grandfather   . Colon polyps Maternal Grandfather   . Heart attack Paternal Grandfather   . Heart disease Paternal Grandfather 57    died of MI  . Arthritis Mother   . Hypothyroidism Mother    Current Outpatient Prescriptions on File Prior to Visit  Medication Sig Dispense Refill  . fexofenadine (ALLEGRA) 180 MG tablet Take 180 mg by mouth daily.      . Multiple Vitamins-Calcium (VIACTIV MULTI-VITAMIN) CHEW Chew 2 each by  mouth daily.      . Polyethylene Glycol 3350 (MIRALAX PO) Take by mouth daily.       . tamoxifen (NOLVADEX) 10 MG tablet Take 10 mg by mouth daily.      Marland Kitchen DISCONTD: venlafaxine (EFFEXOR XR) 37.5 MG 24 hr capsule Take one capsule by mouth daily for 1 (to 2) weeks. Increase to 2 capsules if tolerating, and still having hot flashes  60 capsule  1  . fluticasone (FLONASE) 50 MCG/ACT nasal spray Place 2 sprays into the nose daily.  16 g  6    Allergies  Allergen Reactions  . Morphine     REACTION: itching   ROS:  Denies fevers, URI symptoms, sinus pain, nausea, vomiting, significant headaches, skin rash.  See HPI for other concerns.  PHYSICAL EXAM: BP 88/52  Pulse 72  Ht 5' 3.25" (1.607 m)  Wt 124 lb  (56.246 kg)  BMI 21.79 kg/m2 Well developed, pleasant female in no distress. TM and EAC's normal bilaterally, small amount of fluid on L ear.  Nasal mucosa mildly edematous, no purulence. Sinuses nontender.  Op clear, mild cobblestoning Neck: no lymphadenopathy Heart: regular rate and rhythm without murmur Lungs: clear bilaterally Psych: normal mood, affect, hygiene and grooming.  ASSESSMENT/PLAN:  1. Allergic rhinitis, cause unspecified  fluticasone (FLONASE) 50 MCG/ACT nasal spray  2. Hot flashes  venlafaxine (EFFEXOR-XR) 75 MG 24 hr capsule  3. ADHD (attention deficit hyperactivity disorder)    4. Need for Tdap vaccination  Tdap vaccine greater than or equal to 7yo IM  5. TMJ disease     Hot flashes--due to tamoxifen.  Improved on Effexor, continue. Allergies--suboptimally controlled.  Add Flonase.  Consider decongestants, and/or trying different antihistamines to see if more effective Bee sting--reassured that it still seems to be a localized reaction, not anaphylaxis so doesn't need epi-pen.  Recommended she keep benadryl with her when outside, on soccer fields. TMJ--continue NSAIDs prn, home exercises as recommended. If having bleeding issues, change to tylenol, but okay to continue NSAIDs with flares.  Consider Valium if significant headaches/muscle pains (vs other muscle relaxant).

## 2012-02-04 ENCOUNTER — Other Ambulatory Visit: Payer: Self-pay | Admitting: *Deleted

## 2012-02-04 ENCOUNTER — Other Ambulatory Visit (HOSPITAL_BASED_OUTPATIENT_CLINIC_OR_DEPARTMENT_OTHER): Payer: PRIVATE HEALTH INSURANCE | Admitting: Lab

## 2012-02-04 DIAGNOSIS — C50919 Malignant neoplasm of unspecified site of unspecified female breast: Secondary | ICD-10-CM

## 2012-02-04 LAB — COMPREHENSIVE METABOLIC PANEL
ALT: 17 U/L (ref 0–35)
Alkaline Phosphatase: 50 U/L (ref 39–117)
Creatinine, Ser: 0.71 mg/dL (ref 0.50–1.10)
Sodium: 139 mEq/L (ref 135–145)
Total Bilirubin: 0.3 mg/dL (ref 0.3–1.2)
Total Protein: 6.6 g/dL (ref 6.0–8.3)

## 2012-02-04 LAB — CBC WITH DIFFERENTIAL/PLATELET
BASO%: 0.4 % (ref 0.0–2.0)
EOS%: 0.2 % (ref 0.0–7.0)
HCT: 38.8 % (ref 34.8–46.6)
LYMPH%: 36 % (ref 14.0–49.7)
MCH: 32.7 pg (ref 25.1–34.0)
MCHC: 33.7 g/dL (ref 31.5–36.0)
MCV: 97.3 fL (ref 79.5–101.0)
MONO%: 9 % (ref 0.0–14.0)
NEUT%: 54.4 % (ref 38.4–76.8)
Platelets: 193 10*3/uL (ref 145–400)
RBC: 3.99 10*6/uL (ref 3.70–5.45)
WBC: 4.9 10*3/uL (ref 3.9–10.3)

## 2012-02-04 LAB — CANCER ANTIGEN 27.29: CA 27.29: 27 U/mL (ref 0–39)

## 2012-02-11 ENCOUNTER — Ambulatory Visit (HOSPITAL_BASED_OUTPATIENT_CLINIC_OR_DEPARTMENT_OTHER): Payer: PRIVATE HEALTH INSURANCE | Admitting: Oncology

## 2012-02-11 ENCOUNTER — Telehealth: Payer: Self-pay | Admitting: *Deleted

## 2012-02-11 VITALS — BP 113/65 | HR 67 | Temp 97.8°F | Ht 63.25 in | Wt 123.3 lb

## 2012-02-11 DIAGNOSIS — Z170 Estrogen receptor positive status [ER+]: Secondary | ICD-10-CM

## 2012-02-11 DIAGNOSIS — Z9010 Acquired absence of unspecified breast and nipple: Secondary | ICD-10-CM

## 2012-02-11 DIAGNOSIS — C50919 Malignant neoplasm of unspecified site of unspecified female breast: Secondary | ICD-10-CM

## 2012-02-11 DIAGNOSIS — M26609 Unspecified temporomandibular joint disorder, unspecified side: Secondary | ICD-10-CM

## 2012-02-11 MED ORDER — TAMOXIFEN CITRATE 10 MG PO TABS: 20.0000 mg | ORAL_TABLET | Freq: Every day | ORAL | Status: DC

## 2012-02-11 NOTE — Telephone Encounter (Signed)
Gave patient appointment for 02-03-2013 lab only 02-10-2013 md

## 2012-02-11 NOTE — Progress Notes (Signed)
ID: Kristen Gamble   DOB: 16-Feb-1961  MR#: [id number]  WUJ#:[id number]  HISTORY OF PRESENT ILLNESS:  I do not have records before 10/2006, but in that month the patient underwent bilateral mastectomy for left sided breast cancer, which proved to be an invasive ductal carcinoma, grade 1, measuring 6 mm, in the setting of DCIS (grade 2).  One of the margins was positive focally.  The tumor was ER and PR more than 95% positive, Hercept test negative (at 0/1), and she had an Oncotype recurrence score of 13 predicting a distant risk of recurrence of 8% if she took tamoxifen for 5 years.  In 11/2006 the patient underwent a second excision to clear the margins.  There was a 5 mm residual focus of tumor.  Left axillary lymph node sampling showed a negative sentinel lymph node.  Margins were negative, although close (margins for DCIS were less than 1 mm, and for the invasive tumor less than 2 mm, both deep).  She had lateral subpectoral implants placed, and has been on Femara with fairly significant side effects that are a major source of concern to the patient.  In addition, the patient underwent TAH/BSO 03/2007 with benign pathology.  She has also had multiple other breast biopsies (12/2006, two right sided benign breast lesions; 08/2007, removal of a dermatofibroma; 12/2007, removal of a 2 mm left breast mass which was benign, and 04/2008 a left breast biopsy which proved to be scar).  The patient has had extensive studies including CT of chest, abdomen, and pelvis 12/2007, which was negative.  She had a bone scan in 05/2007 which was negative. Her subsequent history is as detailed below.  INTERVAL HISTORY: Kristen Gamble returns for routine followup of her breast cancer. She just came back from a beach week with her children and enjoyed that a great deal. She continues to be very active with our [name]  REVIEW OF SYSTEMS: She has some neck and lower back discomfort. These are not new symptoms, and they're not more  intense or persistent than prior. She has a dry cough, that she frequently gets this time of year, and she continues to have hot flashes, although they are much better since she started venlafaxine. She has some right foot pain and she is scheduled to see [name] regarding this. A detailed review of systems was otherwise entirely negative  PAST MEDICAL HISTORY: Past Medical History  Diagnosis Date  . Breast cancer 10/2006    left  . Cervical dysplasia   . Osteopenia   . ADHD (attention deficit hyperactivity disorder) 08/2011  . TMJ (temporomandibular joint disorder)     PAST SURGICAL HISTORY: Past Surgical History  Procedure Date  . Colposcopy   . Gynecologic cryosurgery   . Breast surgery 2008    Lumpectomy-Bilateral mastectomy-implants  . Augmentation mammaplasty 1999  . Total abdominal hysterectomy w/ bilateral salpingoophorectomy 9/08  . Tonsillectomy 1971  . Breast biopsy     x4 in 2009-2012, all benign    FAMILY HISTORY Family History  Problem Relation Age of Onset  . Diabetes Father   . Cancer Father     stomach  . Alcohol abuse Father   . Depression Father   . Ulcers Father   . Diabetes Maternal Grandmother   . Hypertension Maternal Grandmother   . Heart attack Maternal Grandmother   . Heart disease Maternal Grandmother   . Hypothyroidism Maternal Grandmother   . Hypertension Maternal Grandfather   . Stroke Maternal Grandfather   . Asthma  Maternal Grandfather   . Colon polyps Maternal Grandfather   . Heart attack Paternal Grandfather   . Heart disease Paternal Grandfather 51    died of MI  . Arthritis Mother   . Hypothyroidism Mother   The patient's father died at the age of 90 in the setting of alcoholism.  The patient's mother is alive. There is a brother in good health.  There is no other history of breast cancer or ovarian cancer in the family.  GYNECOLOGIC HISTORY: She is GX, P2.  First pregnancy to term age 78.  She has had significant problems with  hot flashes and vaginal dryness since she had her TAH/BSO in September of 2008.  SOCIAL HISTORY: Kristen Gamble is very active in our [name]. 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  Her business is called [name].  Their surviving child [name], is heading for college.  Their second child, [name], died at the age of 2.5 from a viral myocarditis.  [name] has a son from a prior marriage, also called [name], and he and the patient have adopted a pair of twins.  The patient attends [employer].   ADVANCED DIRECTIVES: in place  HEALTH MAINTENANCE: History  Substance Use Topics  . Smoking status: Never Smoker   . Smokeless tobacco: Never Used  . Alcohol Use: 2.5 oz/week    5 drink(s) per week     1 glass of wine 4-5 times week.     Colonoscopy:  PAP:  Bone density:  Lipid panel:  Allergies  Allergen Reactions  . Morphine     REACTION: itching    Current Outpatient Prescriptions  Medication Sig Dispense Refill  . fexofenadine (ALLEGRA) 180 MG tablet Take 180 mg by mouth daily.      . fluticasone (FLONASE) 50 MCG/ACT nasal spray Place 2 sprays into the nose daily.  16 g  6  . Multiple Vitamins-Calcium (VIACTIV MULTI-VITAMIN) CHEW Chew 2 each by mouth daily.      . Polyethylene Glycol 3350 (MIRALAX PO) Take by mouth daily.       . tamoxifen (NOLVADEX) 10 MG tablet Take 2 tablets (20 mg total) by mouth daily.  90 tablet  12  . venlafaxine (EFFEXOR-XR) 75 MG 24 hr capsule Take 1 capsule (75 mg total) by mouth daily.  90 capsule  3    OBJECTIVE: Middle-aged white woman who appears well Filed Vitals:   02/11/12 1031  BP: 113/65  Pulse: 67  Temp: 97.8 F (36.6 C)     Body mass index is 21.67 kg/(m^2).    ECOG FS: 0  Sclerae unicteric Oropharynx clear No cervical or supraclavicular adenopathy Lungs no rales or rhonchi Heart regular rate and rhythm Abd benign MSK no focal spinal tenderness to palpation, specifically over the  cervical and lumbosacral spine Neuro: nonfocal Breasts: Status post bilateral mastectomies with implant reconstruction. There is no evidence of local recurrence.  LAB RESULTS: Lab Results  Component Value Date   WBC 4.9 02/04/2012   NEUTROABS 2.7 02/04/2012   HGB 13.1 02/04/2012   HCT 38.8 02/04/2012   MCV 97.3 02/04/2012   PLT 193 02/04/2012      Chemistry      Component Value Date/Time   NA 139 02/04/2012 1337   K 3.9 02/04/2012 1337   CL 104 02/04/2012 1337   CO2 28 02/04/2012 1337   BUN 19 02/04/2012 1337   CREATININE 0.71 02/04/2012 1337  Component Value Date/Time   CALCIUM 10.1 02/04/2012 1337   ALKPHOS 50 02/04/2012 1337   AST 22 02/04/2012 1337   ALT 17 02/04/2012 1337   BILITOT 0.3 02/04/2012 1337       Lab Results  Component Value Date   LABCA2 27 02/04/2012    No components found with this basename: [id number]    No results found for this basename: INR:1;PROTIME:1 in the last 168 hours  Urinalysis    Component Value Date/Time   COLORURINE YELLOW 08/13/2011 1222   APPEARANCEUR CLEAR 08/13/2011 1222   LABSPEC 1.015 08/13/2011 1222   PHURINE 7.5 08/13/2011 1222   GLUCOSEU NEG 08/13/2011 1222   HGBUR TRACE* 08/13/2011 1222   BILIRUBINUR neg 09/17/2011 0858   BILIRUBINUR NEG 08/13/2011 1222   KETONESUR NEG 08/13/2011 1222   PROTEINUR NEG 08/13/2011 1222   UROBILINOGEN negative 09/17/2011 0858   UROBILINOGEN 0.2 08/13/2011 1222   NITRITE neg 09/17/2011 0858   NITRITE NEG 08/13/2011 1222   LEUKOCYTESUR Negative 09/17/2011 0858    STUDIES: DEXA scan July 2012 showed osteopenia with a T score of -1.7  ASSESSMENT: 51 y.o. [place] woman status post bilateral mastectomies with subpectoral implant placement July 2008 for a T1c N0, grade 1 invasive ductal carcinoma, strongly estrogen and progesterone receptor positive, HER2/neu negative, Oncotype DX recurrence score of 13, predicting an 8% risk of recurrence after 5 years of tamoxifen.   She started antiestrogens September 2008,  initially tamoxifen, then Femara with Lupron, switched to tamoxifen again November 2009, stopped tamoxifen at her own discretion December 2011, resumed July 2012  PLAN: The plan is to continue tamoxifen an additional year, then reassess. At that point she will basically have had for years of tamoxifen and one of an aromatase inhibitor. Her risk of recurrence should be less than 8%. She may wish to continue tamoxifen 2 total 10 years, or discontinue followup altogether and "get out of the cancer business". We will make the decision at the next visit. She knows to call for any problems that may develop before then.  Cade Olberding C    02/12/2012  [phone]

## 2012-02-12 ENCOUNTER — Telehealth: Payer: Self-pay | Admitting: *Deleted

## 2012-02-12 ENCOUNTER — Encounter: Payer: Self-pay | Admitting: Oncology

## 2012-02-12 DIAGNOSIS — M26609 Unspecified temporomandibular joint disorder, unspecified side: Secondary | ICD-10-CM | POA: Insufficient documentation

## 2012-02-12 NOTE — Telephone Encounter (Signed)
Mailed out calendar informing the patient of th new date and time in 2014

## 2012-07-16 HISTORY — PX: FOOT SURGERY: SHX648

## 2012-08-22 ENCOUNTER — Encounter: Payer: PRIVATE HEALTH INSURANCE | Admitting: Gynecology

## 2012-08-26 ENCOUNTER — Encounter: Payer: Self-pay | Admitting: Gynecology

## 2012-08-26 ENCOUNTER — Ambulatory Visit (INDEPENDENT_AMBULATORY_CARE_PROVIDER_SITE_OTHER): Payer: BC Managed Care – PPO | Admitting: Gynecology

## 2012-08-26 VITALS — BP 110/66 | Ht 63.0 in | Wt 126.0 lb

## 2012-08-26 DIAGNOSIS — IMO0002 Reserved for concepts with insufficient information to code with codable children: Secondary | ICD-10-CM

## 2012-08-26 DIAGNOSIS — Z01419 Encounter for gynecological examination (general) (routine) without abnormal findings: Secondary | ICD-10-CM

## 2012-08-26 DIAGNOSIS — B373 Candidiasis of vulva and vagina: Secondary | ICD-10-CM

## 2012-08-26 DIAGNOSIS — N952 Postmenopausal atrophic vaginitis: Secondary | ICD-10-CM

## 2012-08-26 LAB — WET PREP FOR TRICH, YEAST, CLUE

## 2012-08-26 MED ORDER — TERCONAZOLE 0.4 % VA CREA: 1.0000 | TOPICAL_CREAM | Freq: Every day | VAGINAL | Status: DC

## 2012-08-26 NOTE — Progress Notes (Signed)
Kristen Gamble August 19, 1960 [id number]        52 y.o.  G2P2001 for annual exam.  Several issues below.  Past medical history,surgical history, medications, allergies, family history and social history were all reviewed and documented in the EPIC chart. ROS:  Was performed and pertinent positives and negatives are included in the history.  Exam: Kim assistant Filed Vitals:   08/26/12 1228  BP: 110/66  Height: 5\' 3"  (1.6 m)  Weight: 126 lb (57.153 kg)   General appearance  Normal Skin grossly normal Head/Neck normal with no cervical or supraclavicular adenopathy thyroid normal Lungs  clear Cardiac RR, without RMG Abdominal  soft, nontender, without masses, organomegaly or hernia Chest examined lying and sitting, bilateral reconstruction with implants without masses, retractions or axillary adenopathy.   Pelvic  Ext/BUS/vagina  Atrophic changes with mild general vulvitis. Slight white discharge  Adnexa  Without masses or tenderness    Anus and perineum  normal   Rectovaginal  normal sphincter tone without palpated masses or tenderness.    Assessment/Plan:  52 y.o. G26P2001 female for annual exam.   1. History of adenocarcinoma the right breast status post bilateral mastectomy. Stage I historically, estrogen progesterone receptor positive. Has restarted tamoxifen with plans to continue for 2 years. Actively being followed by Dr. Darnelle Catalan will continue to follow up with him. 2. Atrophic vaginitis. Patient notes a lot of vaginal dryness but most acutely more discomfort.  Is also having pain with intercourse. Exam shows atrophic changes but also a more acute inflammatory picture. Scant white discharge. Wet prep does show yeast. Will treat with Terazol 7 day cream and then a second prescription to use the cream once weekly for 2 months and we'll see if this doesn't help with her discomfort. Replens twice weekly also recommended. We discussed the options for vaginal estrogen to include cream, Estring,  Vagifem. Do not think Osphena appropriate given she's on tamoxifen. The issues of absorption with the above products although limited, possible in the counter intuitive nature of being on tamoxifen and adding estrogen discussed.  Patient's comfortable with just treating the yeast for now and trying the Replens and going from there.  Once she is off tamoxifen we could consider Osphena if Dr. Darnelle Catalan would agree. 3. History of dysplasia. No Pap smear done today.  Cryo- surgery age 30 with normal Pap smears since then. Review of her records show normal sequential Pap smears with the last one in 2013. She is status post TAH/BSO as part of a risk reduction surgery and I reviewed current screening guidelines as far as stopping after hysterectomy or doing a less frequent interval. She is over 20 years out from her cryo-. We'll readdress on an annual basis. 4. Menopausal symptoms. Patient started on Effexor and is helping with the symptoms. She'll continue on this for now. 5. Osteopenia. She is known to have osteopenia on bone density through Dr. Darrall Dears office. She'll continue to follow up with him in reference to this. 6. Colonoscopy. She had a colonoscopy at age 44 due to some GI complaints which was negative. Due for a colonoscopy now she knows to schedule this. 7. Health maintenance. No blood work was done today as is all done through her primary's office. I did check a urinalysis. Assuming she continues well then she will see me in a year.    Dara Lords MD, 1:10 PM 08/26/2012

## 2012-08-26 NOTE — Patient Instructions (Signed)
Used Terazol vaginal cream nightly for 7 days then use one applicator weekly for 2 months. Follow up in one year for annual exam.

## 2012-08-27 LAB — URINALYSIS W MICROSCOPIC + REFLEX CULTURE
Casts: NONE SEEN
Crystals: NONE SEEN
Glucose, UA: NEGATIVE mg/dL
Ketones, ur: NEGATIVE mg/dL
Nitrite: NEGATIVE
Specific Gravity, Urine: 1.01 (ref 1.005–1.030)
pH: 7.5 (ref 5.0–8.0)

## 2012-08-30 LAB — URINE CULTURE: Colony Count: 30000

## 2012-09-01 ENCOUNTER — Other Ambulatory Visit: Payer: Self-pay | Admitting: Gynecology

## 2012-09-01 MED ORDER — CIPROFLOXACIN HCL 250 MG PO TABS: 250.0000 mg | ORAL_TABLET | Freq: Two times a day (BID) | ORAL | Status: DC

## 2012-09-12 ENCOUNTER — Encounter: Payer: Self-pay | Admitting: Medical

## 2012-09-12 ENCOUNTER — Ambulatory Visit (INDEPENDENT_AMBULATORY_CARE_PROVIDER_SITE_OTHER): Payer: BC Managed Care – PPO | Admitting: Medical

## 2012-09-12 VITALS — BP 92/68 | HR 66 | Temp 98.1°F | Resp 16 | Wt 127.0 lb

## 2012-09-12 DIAGNOSIS — R110 Nausea: Secondary | ICD-10-CM

## 2012-09-12 DIAGNOSIS — M26609 Unspecified temporomandibular joint disorder, unspecified side: Secondary | ICD-10-CM

## 2012-09-12 DIAGNOSIS — H8309 Labyrinthitis, unspecified ear: Secondary | ICD-10-CM

## 2012-09-12 MED ORDER — ONDANSETRON HCL 4 MG PO TABS: 4.0000 mg | ORAL_TABLET | Freq: Three times a day (TID) | ORAL | Status: DC | PRN

## 2012-09-12 NOTE — Progress Notes (Signed)
Subjective:  Kristen Gamble is a 52 y.o. female who presents for dizziness.  She reports having cold symptoms off and on for the last few weeks with cough, runny nose, sore throat, but most of that has resolved.  Yesterday started feeling dizzy, room spinning sensation, causing nausea.  Symptoms worse with sitting up, standing up, moving.  Has had some cough, subjective fever, chills.  Using nothing specifically for these symptoms.  She has hx/o TMJ and this has been flaring up on the left.  Wears bite guard, takes Aleve periodically, and has even discussed surgery with maxillofacial surgeon.  Denies sick contacts.  No other aggravating or relieving factors.  No other c/o.  Past Medical History  Diagnosis Date  . Breast cancer 10/2006    left  . Osteopenia   . ADHD (attention deficit hyperactivity disorder) 08/2011  . TMJ (temporomandibular joint disorder)    ROS otherwise unremarkable   Objective:  Filed Vitals:   09/12/12 1611  BP: 92/68  Pulse: 66  Temp: 98.1 F (36.7 C)  Resp: 16    General appearance: Alert, WD/WN, no distress                             Skin: warm, no rash                           Head: no sinus tenderness                            Eyes: conjunctiva normal, corneas clear, PERRLA                            Ears: pearly TMs, external ear canals normal                          Nose: septum midline, turbinates swollen, with erythema and clear discharge             Mouth/throat: MMM, tongue normal, mild pharyngeal erythema                           Neck: supple, no adenopathy, no thyromegaly, nontender                          Heart: RRR, normal S1, S2, no murmurs                         Lungs: CTA bilaterally, no wheezes, rales, or rhonchi   Neuro: Cn2-12 intact, normal strength, sensation, nonfocal exam  Assessment and Plan: Encounter Diagnoses  Name Primary?  . Labyrinthitis, acute viral, unspecified laterality Yes  . Nausea alone   . TMJ (temporomandibular  joint syndrome)    Labyrinthitis - discussed symptoms, usual course of illness that resolves on its own, advised rest, good hydration, avoid sudden movements or activity that worsens the symptoms, begin back on Allegra daily for the next few weeks, Zofran prescribed today for nausea.  If worse or not improving, call or return.  TMJ - OTC Aleve.  Wear bite guard.  We can consider muscle relaxer if not improving.

## 2012-09-12 NOTE — Patient Instructions (Signed)
Zofran 1 tablet every 4-6 hours as needed for nausea.  Begin back Aleve for TMJ  Start back the Allegra daily for allergies and vertigo  Rest, drink plenty of water  Call if not improving.    Labyrinthitis (Inner Ear Inflammation) Your exam shows you have an inner ear disturbance or labyrinthitis. The cause of this condition is not known. But it may be due to a virus infection. The symptoms of labyrinthitis include vertigo or dizziness made worse by motion, nausea and vomiting. The onset of labyrinthitis may be very sudden. It usually lasts for a few days and then clears up over 1-2 weeks. The treatment of an inner ear disturbance includes bed rest and medications to reduce dizziness, nausea, and vomiting. You should stay away from alcohol, tranquilizers, caffeine, nicotine, or any medicine your doctor thinks may make your symptoms worse. Further testing may be needed to evaluate your hearing and balance system. Please see your doctor or go to the emergency room right away if you have:  Increasing vertigo, earache, loss of hearing, or ear drainage.  Headache, blurred vision, trouble walking, fainting, or fever.  Persistent vomiting, dehydration, or extreme weakness. Document Released: 07/02/2005 Document Revised: 09/24/2011 Document Reviewed: 12/18/2006 [facility] Patient Information 2013 [place], [place].

## 2012-10-22 ENCOUNTER — Other Ambulatory Visit: Payer: Self-pay | Admitting: Family Medicine

## 2012-11-06 ENCOUNTER — Ambulatory Visit (INDEPENDENT_AMBULATORY_CARE_PROVIDER_SITE_OTHER): Payer: BC Managed Care – PPO | Admitting: Family Medicine

## 2012-11-06 ENCOUNTER — Encounter: Payer: Self-pay | Admitting: Family Medicine

## 2012-11-06 VITALS — BP 112/72 | HR 76 | Ht 63.5 in | Wt 128.0 lb

## 2012-11-06 DIAGNOSIS — R5383 Other fatigue: Secondary | ICD-10-CM

## 2012-11-06 DIAGNOSIS — N951 Menopausal and female climacteric states: Secondary | ICD-10-CM

## 2012-11-06 DIAGNOSIS — J309 Allergic rhinitis, unspecified: Secondary | ICD-10-CM

## 2012-11-06 DIAGNOSIS — R232 Flushing: Secondary | ICD-10-CM

## 2012-11-06 DIAGNOSIS — E559 Vitamin D deficiency, unspecified: Secondary | ICD-10-CM

## 2012-11-06 DIAGNOSIS — R5381 Other malaise: Secondary | ICD-10-CM

## 2012-11-06 LAB — COMPREHENSIVE METABOLIC PANEL
ALT: 18 U/L (ref 0–35)
Alkaline Phosphatase: 57 U/L (ref 39–117)
Creat: 0.95 mg/dL (ref 0.50–1.10)
Sodium: 141 mEq/L (ref 135–145)
Total Bilirubin: 0.3 mg/dL (ref 0.3–1.2)
Total Protein: 6.6 g/dL (ref 6.0–8.3)

## 2012-11-06 LAB — CBC WITH DIFFERENTIAL/PLATELET
Eosinophils Absolute: 0.1 10*3/uL (ref 0.0–0.7)
Eosinophils Relative: 1 % (ref 0–5)
HCT: 36.3 % (ref 36.0–46.0)
Lymphs Abs: 2 10*3/uL (ref 0.7–4.0)
MCH: 32.7 pg (ref 26.0–34.0)
MCV: 92.6 fL (ref 78.0–100.0)
Monocytes Absolute: 0.5 10*3/uL (ref 0.1–1.0)
Platelets: 207 10*3/uL (ref 150–400)
RBC: 3.92 MIL/uL (ref 3.87–5.11)
RDW: 14.2 % (ref 11.5–15.5)

## 2012-11-06 MED ORDER — FLUTICASONE PROPIONATE 50 MCG/ACT NA SUSP: 2.0000 | Freq: Every day | NASAL | Status: DC

## 2012-11-06 MED ORDER — VENLAFAXINE HCL ER 75 MG PO CP24: ORAL_CAPSULE | ORAL | Status: DC

## 2012-11-06 NOTE — Progress Notes (Signed)
Chief Complaint  Patient presents with  . follow-up    follow-up on effexor   Recently seen with vertigo, which has resolved. She thinks that may have ben when allergies started flaring. Allergies are well controlled with Allegra and Flonase. She restarted Flonase after her last visit, and symptoms are now improved.  Needs a refill on the Flonase.  She is taking Allegra daily.  Effexor has helped with her hot flashes, and is now sleeping through the night, and feels much better.  She admits to trying her sons Adderall in the past, and it seemed to help.  It was only 5mg , and she noticed improvement. She is complaining of a lot of fatigue, feels like she could lie down and sleep at any moment.  She is sleeping well at night.  Allergies have been flaring, but are under control with her current regimen (stable/improved, still with some symptoms). Moods have been okay--some stress related to her children.  Daughter needs to decide by tomorrow whether to attend [employer] vs [facility].  H/o vitamin D deficiency, checked and monitored through oncologist office.  At one point was on prescription vitamin D, then just OTC supplementation.  She has only been on MVI with D most recently (as she was told this was adequate).  Past Medical History  Diagnosis Date  . Breast cancer 10/2006    left  . Osteopenia   . ADHD (attention deficit hyperactivity disorder) 08/2011  . TMJ (temporomandibular joint disorder)    Past Surgical History  Procedure Laterality Date  . Breast surgery  2008    Lumpectomy-Bilateral mastectomy-implants  . Augmentation mammaplasty  1999  . Total abdominal hysterectomy w/ bilateral salpingoophorectomy  9/08  . Tonsillectomy  1971  . Breast biopsy      x4 in 2009-2012, all benign  . Pelvic laparoscopy      LAP HYST BSO  . Laparoscopic total hysterectomy    . Gynecologic cryosurgery  age 11   History   Social History  . Marital Status: Married    Spouse Name: N/A    Number  of Children: 4  . 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 Smoking status: Never Smoker   . Smokeless tobacco: Never Used  . Alcohol Use: 2.5 oz/week    5 drink(s) per week     Comment: 1 glass of wine 4-5 times week.  . Drug Use: No  . Sexually Active: Yes -- Female partner(s)    Birth Control/ Protection: Surgical     Comment: HYST   Other Topics Concern  . Not on file   Social History Narrative   Lives with her husband, 41 yo daughter and 62 year old twins, 2 dogs, 2 birds   Current Outpatient Prescriptions on File Prior to Visit  Medication Sig Dispense Refill  . fexofenadine (ALLEGRA) 180 MG tablet Take 180 mg by mouth daily.      . Multiple Vitamins-Calcium (VIACTIV MULTI-VITAMIN) CHEW Chew 2 each by mouth daily.      . Polyethylene Glycol 3350 (MIRALAX PO) Take by mouth daily.       . tamoxifen (NOLVADEX) 10 MG tablet Take 2 tablets (20 mg total) by mouth daily.  90 tablet  12   No current facility-administered medications on file prior to visit.   Allergies  Allergen Reactions  . Morphine     REACTION: itching   ROS:  See HPI.  Denies fevers, URI symptoms, chest pain, palpitations, shortness of breath.  +allergies; vertigo resolved.  +fatigue.  No weight change.  + constipation--needing to use Miralax BID.  Denies urinary complaints, headaches.  PHYSICAL EXAM: BP 112/72  Pulse 76  Ht 5' 3.5" (1.613 m)  Wt 128 lb (58.06 kg)  BMI 22.32 kg/m2 Well developed, pleasant female in no distress.   HEENT:  PERRL, EOMI, conjunctiva clear.  Mild edema of nasal mucosa, no erythema or purulence.  OP clear.  Sinuses nontender Neck: no lymphadenopathy, thyromegaly or mass Heart: regular rate and rhythm without murmur Lungs: clear bilaterally Abdomen: soft, nontender Psych: normal mood, affect, hygiene and grooming.  Normal speech, eye contact Neuro: alert and oriented.  Cranial nerves intact.  Normal  gait, strength, sensation Skin: no rashes, bruising.   ASSESSMENT/PLAN: Other malaise and fatigue - Plan: Comprehensive metabolic panel, CBC with Differential, TSH, Vitamin D 25 hydroxy  Unspecified vitamin D deficiency - Plan: Vitamin D 25 hydroxy  Hot flashes  Allergic rhinitis, cause unspecified - Plan: fluticasone (FLONASE) 50 MCG/ACT nasal spray  Discussed Ddx of fatigue.  Consider depression if worsening mood, stress. Discussed stress reduction, exercise. Discussed how response to adderall is NOT an indicator of diagnosis.  She has, however, been diagnosed with ADHD, but has done well without meds.  Her main complaint if that of fatigue, not attention/concentration problems, so I do not recommend using stimulant for energy.  Hot flashes--improved/controlled with Effexor, continue.  Consider increased dose if develops symptoms to suggest depression as contributing to her fatigue.  F/u 1 year, sooner prn.

## 2012-11-06 NOTE — Patient Instructions (Addendum)
Continue your current medications. We will be in touch with your lab results. If all labs are normal, work on stress reduction, regular exercise.  If you notice moods worsening, we can consider dose change of effexor, as discussed.

## 2012-11-10 NOTE — Progress Notes (Signed)
Quick Note:  PT INFORMED WORD FOR WORD Advise pt that all labs are normal. Thyroid and vitamin D levels are fine, no anemia, chem panel normal. Okay to continue her current multivitamin (getting adequate Vitamin D). No cause for fatigue found in lab results PT VERBALIZED UNDERSTANDING ______

## 2012-11-28 ENCOUNTER — Ambulatory Visit (INDEPENDENT_AMBULATORY_CARE_PROVIDER_SITE_OTHER): Payer: BC Managed Care – PPO | Admitting: Medical

## 2012-11-28 ENCOUNTER — Encounter: Payer: Self-pay | Admitting: Medical

## 2012-11-28 VITALS — BP 100/70 | HR 62 | Temp 98.2°F | Resp 16 | Wt 128.0 lb

## 2012-11-28 DIAGNOSIS — N3090 Cystitis, unspecified without hematuria: Secondary | ICD-10-CM

## 2012-11-28 LAB — POCT URINALYSIS DIPSTICK
Bilirubin, UA: NEGATIVE
Ketones, UA: NEGATIVE
Protein, UA: NEGATIVE
Spec Grav, UA: <=1.005

## 2012-11-28 MED ORDER — CIPROFLOXACIN HCL 500 MG PO TABS: 500.0000 mg | ORAL_TABLET | Freq: Two times a day (BID) | ORAL | Status: DC

## 2012-11-28 NOTE — Progress Notes (Signed)
Subjective:  Kristen Gamble is a 52 y.o. female who complains of possible UTI.   Has been having about 5 days of back pain, but in the last day been having burning with urination, urgency, urinary odor, cloudy urine, some discomfort suprapubic region.  Denies fever, no NVD, no blood in urine.  Not sure what brought this on.  Been drinking plenty of water.  Saw gynecology in 08/2012, had UTI then too and yeast infection, but hasn't had UTI otherwise in years.  Has been having some vaginal dryness and painful intercourse, discussed this with Dr. Audie Box.  No recent fall, injury, heavy lifting or back injury.    Past Medical History  Diagnosis Date  . Breast cancer 10/2006    left  . Osteopenia   . ADHD (attention deficit hyperactivity disorder) 08/2011  . TMJ (temporomandibular joint disorder)    ROS as in subjective  Objective:    Filed Vitals:   11/28/12 1350  BP: 100/70  Pulse: 62  Temp: 98.2 F (36.8 C)  Resp: 16    General appearance: alert, no distress, WD/WN, female Abdomen: +bs, soft, mild suprapubic tenderness, otherwise non tender, non distended, no masses, no hepatomegaly, no splenomegaly, no bruits Back: no CVA tenderness Pulses: 2+ symmetric      Assessment and Plan:   Encounter Diagnosis  Name Primary?  . Cystitis Yes   Discussed her symptoms, reviewed negative urine culture from 07/2012, answered her questions.   Short course of cipro, hydrate well.  Advised repeat UA to show clearance when she goes in for her yearly oncology visit soon.   Discussed reasons for the recent UTIs, likely related to age and hormonal related vaginal changes.   If she gets another UTI in near future, culture will be recommended.

## 2012-12-10 ENCOUNTER — Ambulatory Visit (INDEPENDENT_AMBULATORY_CARE_PROVIDER_SITE_OTHER): Payer: BC Managed Care – PPO | Admitting: Medical

## 2012-12-10 ENCOUNTER — Encounter: Payer: Self-pay | Admitting: Medical

## 2012-12-10 VITALS — BP 98/70 | HR 60 | Temp 98.0°F | Resp 16 | Wt 129.0 lb

## 2012-12-10 DIAGNOSIS — Z2089 Contact with and (suspected) exposure to other communicable diseases: Secondary | ICD-10-CM

## 2012-12-10 DIAGNOSIS — R42 Dizziness and giddiness: Secondary | ICD-10-CM

## 2012-12-10 DIAGNOSIS — Z20818 Contact with and (suspected) exposure to other bacterial communicable diseases: Secondary | ICD-10-CM

## 2012-12-10 NOTE — Progress Notes (Signed)
Subjective:  Kristen Gamble is a 52 y.o. female who presents for dizziness.  I saw her for similar in 08/2012.  That episode resolved, and she had a mother mild episode about a month later.  She notes 3-4 day hx/o head congestion, coughing, ears stopped up,ears hurting, sinus pressure, using allegra, fluticasone, has scratchy throat.  Today she started getting vertigo/head spinning sensation again.   Worse with head movement, makes her nauseated.    She came is due to this and the fact that her daughter was diagnosed with strep throat yesterday.  No other aggravating or relieving factors.  No other c/o.  Past Medical History  Diagnosis Date  . Breast cancer 10/2006    left  . Osteopenia   . ADHD (attention deficit hyperactivity disorder) 08/2011  . TMJ (temporomandibular joint disorder)    ROS otherwise unremarkable   Objective:  Filed Vitals:   12/10/12 1210  BP: 98/70  Pulse: 60  Temp: 98 F (36.7 C)  Resp: 16    General appearance: Alert, WD/WN, no distress                             Skin: warm, no rash                           Head: no sinus tenderness                            Eyes: conjunctiva normal, corneas clear, PERRLA                            Ears: +serous effusion noted behind TMs, no erythema, external ear canals normal                          Nose: septum midline, turbinates with erythema, no discharge             Mouth/throat: MMM, tongue normal, mild pharyngeal erythema                           Neck: supple, no adenopathy, no thyromegaly, nontender                          Heart: RRR, normal S1, S2, no murmurs                         Lungs: CTA bilaterally, no wheezes, rales, or rhonchi   Neuro: Cn2-12 intact, normal strength, sensation, nonfocal exam  Assessment and Plan: Encounter Diagnoses  Name Primary?  . Dizziness and giddiness Yes  . Strep throat exposure    similar to her 08/2012 visit, I suspect vertigo/dizziness in addition to related eustachian tube  dysfunction as well given her current URI symptoms.  At this point I don't suspect strep infection given her symptoms, lack of throat abnormalities and no fever.   Advised rest, hydration, can c/t the OTC Meclizine she is taking, don't drive if too dizzy, can use supportive care as discussed for the sore throat, cough, and head congestion.   If worse or not improving, call back - we can consider Amoxicillin for worse fever/sore throat symptoms given her exposure and consider neuro rehab for vertigo if  not improving.  Can use Zofran prescribed in 08/2012 prn for nausea.

## 2012-12-10 NOTE — Patient Instructions (Addendum)
For your vertigo symptoms, consider taking Dramamine OTC as labeled or the Bonine 1-1.5 tablets three times daily for the next several days.  Drink plenty of water daily.  Rest, and don't drive or make sudden movements if too dizzy.  Use salt water gargles, warm fluids, and you can use OTC Ibuprofen for sore throat and not feeling well.   If you have worse sore throat or fever, call back in case we need to use antibiotic regarding strep + contactVertigo Vertigo means you feel like you or your surroundings are moving when they are not. Vertigo can be dangerous if it occurs when you are at work, driving, or performing difficult activities.  CAUSES  Vertigo occurs when there is a conflict of signals sent to your brain from the visual and sensory systems in your body. There are many different causes of vertigo, including:  Infections, especially in the inner ear.  A bad reaction to a drug or misuse of alcohol and medicines.  Withdrawal from drugs or alcohol.  Rapidly changing positions, such as lying down or rolling over in bed.  A migraine headache.  Decreased blood flow to the brain.  Increased pressure in the brain from a head injury, infection, tumor, or bleeding. SYMPTOMS  You may feel as though the world is spinning around or you are falling to the ground. Because your balance is upset, vertigo can cause nausea and vomiting. You may have involuntary eye movements (nystagmus). DIAGNOSIS  Vertigo is usually diagnosed by physical exam. If the cause of your vertigo is unknown, your caregiver may perform imaging tests, such as an MRI scan (magnetic resonance imaging). TREATMENT  Most cases of vertigo resolve on their own, without treatment. Depending on the cause, your caregiver may prescribe certain medicines. If your vertigo is related to body position issues, your caregiver may recommend movements or procedures to correct the problem. In rare cases, if your vertigo is caused by certain  inner ear problems, you may need surgery. HOME CARE INSTRUCTIONS   Follow your caregiver's instructions.  Avoid driving.  Avoid operating heavy machinery.  Avoid performing any tasks that would be dangerous to you or others during a vertigo episode.  Tell your caregiver if you notice that certain medicines seem to be causing your vertigo. Some of the medicines used to treat vertigo episodes can actually make them worse in some people. SEEK IMMEDIATE MEDICAL CARE IF:   Your medicines do not relieve your vertigo or are making it worse.  You develop problems with talking, walking, weakness, or using your arms, hands, or legs.  You develop severe headaches.  Your nausea or vomiting continues or gets worse.  You develop visual changes.  A family member notices behavioral changes.  Your condition gets worse. MAKE SURE YOU:  Understand these instructions.  Will watch your condition.  Will get help right away if you are not doing well or get worse. Document Released: 04/11/2005 Document Revised: 09/24/2011 Document Reviewed: 01/18/2011 [facility] Patient Information 2014 [place], [place]. Marland Kitchen

## 2013-02-02 ENCOUNTER — Other Ambulatory Visit: Payer: Self-pay | Admitting: Physician Assistant

## 2013-02-02 DIAGNOSIS — C50919 Malignant neoplasm of unspecified site of unspecified female breast: Secondary | ICD-10-CM

## 2013-02-03 ENCOUNTER — Other Ambulatory Visit (HOSPITAL_BASED_OUTPATIENT_CLINIC_OR_DEPARTMENT_OTHER): Payer: BC Managed Care – PPO | Admitting: Lab

## 2013-02-03 DIAGNOSIS — C50919 Malignant neoplasm of unspecified site of unspecified female breast: Secondary | ICD-10-CM

## 2013-02-03 LAB — CBC WITH DIFFERENTIAL/PLATELET
Eosinophils Absolute: 0.1 10*3/uL (ref 0.0–0.5)
HCT: 39.3 % (ref 34.8–46.6)
LYMPH%: 39.5 % (ref 14.0–49.7)
MCV: 95.1 fL (ref 79.5–101.0)
MONO#: 0.5 10*3/uL (ref 0.1–0.9)
MONO%: 11.7 % (ref 0.0–14.0)
NEUT#: 2.1 10*3/uL (ref 1.5–6.5)
NEUT%: 46.8 % (ref 38.4–76.8)
Platelets: 219 10*3/uL (ref 145–400)
WBC: 4.6 10*3/uL (ref 3.9–10.3)

## 2013-02-03 LAB — COMPREHENSIVE METABOLIC PANEL (CC13)
Alkaline Phosphatase: 57 U/L (ref 40–150)
BUN: 17 mg/dL (ref 7.0–26.0)
CO2: 28 mEq/L (ref 22–29)
Creatinine: 0.8 mg/dL (ref 0.6–1.1)
Glucose: 79 mg/dl (ref 70–140)
Total Bilirubin: 0.32 mg/dL (ref 0.20–1.20)

## 2013-02-10 ENCOUNTER — Telehealth: Payer: Self-pay | Admitting: *Deleted

## 2013-02-10 ENCOUNTER — Ambulatory Visit (HOSPITAL_BASED_OUTPATIENT_CLINIC_OR_DEPARTMENT_OTHER): Payer: BC Managed Care – PPO | Admitting: Oncology

## 2013-02-10 VITALS — BP 162/70 | HR 83 | Temp 98.5°F | Resp 20 | Ht 63.5 in | Wt 130.2 lb

## 2013-02-10 DIAGNOSIS — C50919 Malignant neoplasm of unspecified site of unspecified female breast: Secondary | ICD-10-CM

## 2013-02-10 MED ORDER — ESTRADIOL 25 MCG VA TABS: 25.0000 ug | ORAL_TABLET | VAGINAL | Status: DC

## 2013-02-10 MED ORDER — TAMOXIFEN CITRATE 10 MG PO TABS: 20.0000 mg | ORAL_TABLET | Freq: Every day | ORAL | Status: DC

## 2013-02-10 NOTE — Progress Notes (Signed)
ID: Kristen Gamble   DOB: Feb 04, 1961  MR#: [id number]  WUJ#:[id number]  PCP: Lavonda Jumbo, MD GYN:Tim Fontaine SU:  OTHER MD: Newman Pies   HISTORY OF PRESENT ILLNESS:  I do not have records before 10/2006, but in that month the patient underwent bilateral mastectomy for left sided breast cancer, which proved to be an invasive ductal carcinoma, grade 1, measuring 6 mm, in the setting of DCIS (grade 2).  One of the margins was positive focally.  The tumor was ER and PR more than 95% positive, Hercept test negative (at 0/1), and she had an Oncotype recurrence score of 13 predicting a distant risk of recurrence of 8% if she took tamoxifen for 5 years.  In 11/2006 the patient underwent a second excision to clear the margins.  There was a 5 mm residual focus of tumor.  Left axillary lymph node sampling showed a negative sentinel lymph node.  Margins were negative, although close (margins for DCIS were less than 1 mm, and for the invasive tumor less than 2 mm, both deep).  She had lateral subpectoral implants placed, and has been on Femara with fairly significant side effects that are a major source of concern to the patient.  In addition, the patient underwent TAH/BSO 03/2007 with benign pathology.  She has also had multiple other breast biopsies (12/2006, two right sided benign breast lesions; 08/2007, removal of a dermatofibroma; 12/2007, removal of a 2 mm left breast mass which was benign, and 04/2008 a left breast biopsy which proved to be scar).  The patient has had extensive studies including CT of chest, abdomen, and pelvis 12/2007, which was negative.  She had a bone scan in 05/2007 which was negative. Her subsequent history is as detailed below.  INTERVAL HISTORY: Kristen Gamble returns for followup of her breast cancer. Interval history is significant for her having developed a chronic pain problem related to left sided TMJ, causing headaches and vertigo. She is trying nonsteroidals and Imitrex with very little  success so far.   REVIEW OF SYSTEMS: Aside from those issues she has significant vaginal dryness, with repetitive urinary tract infections and pain with intercourse. She has some chest pain related to reflux from the nonsteroidals there is just a little bit of a dry cough. She has some chronic back and joint pain, and she has hot flashes. She has not had nausea or vomiting, visual changes, gait imbalance or falls. A detailed review of systems today was otherwise stable.  PAST MEDICAL HISTORY: Past Medical History  Diagnosis Date  . Breast cancer 10/2006    left  . Osteopenia   . ADHD (attention deficit hyperactivity disorder) 08/2011  . TMJ (temporomandibular joint disorder)     PAST SURGICAL HISTORY: Past Surgical History  Procedure Laterality Date  . Breast surgery  2008    Lumpectomy-Bilateral mastectomy-implants  . Augmentation mammaplasty  1999  . Total abdominal hysterectomy w/ bilateral salpingoophorectomy  9/08  . Tonsillectomy  1971  . Breast biopsy      x4 in 2009-2012, all benign  . Pelvic laparoscopy      LAP HYST BSO  . Laparoscopic total hysterectomy    . Gynecologic cryosurgery  age 21    FAMILY HISTORY Family History  Problem Relation Age of Onset  . Diabetes Father   . Cancer Father     stomach  . Alcohol abuse Father   . Depression Father   . Ulcers Father   . Diabetes Maternal Grandmother   . Hypertension Maternal Grandmother   .  Heart attack Maternal Grandmother   . Heart disease Maternal Grandmother   . Hypothyroidism Maternal Grandmother   . Hypertension Maternal Grandfather   . Stroke Maternal Grandfather   . Asthma Maternal Grandfather   . Colon polyps Maternal Grandfather   . Heart attack Paternal Grandfather   . Heart disease Paternal Grandfather 60    died of MI  . Arthritis Mother   . Hypothyroidism Mother   . Diabetes Brother   The patient's father died at the age of 24 in the setting of alcoholism.  The patient's mother is alive.  There is a brother in good health.  There is no other history of breast cancer or ovarian cancer in the family.  GYNECOLOGIC HISTORY: She is GX, P2.  First pregnancy to term age 70.  She has had significant problems with hot flashes and vaginal dryness since she had her TAH/BSO in September of 2008.  SOCIAL HISTORY: Kristen Gamble is very active in our [name]. 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  Her business is called [employer].  Their surviving child Kristen Gamble, is heading for college.  Their second child, Kristen Gamble, died at the age of 2.5 from a viral myocarditis.  Kristen Gamble has a son from a prior marriage, also called [employer], and he and the patient have adopted a pair of twins.  The patient attends [employer].   ADVANCED DIRECTIVES: in place  HEALTH MAINTENANCE: History  Substance Use Topics  . Smoking status: Never Smoker   . Smokeless tobacco: Never Used  . Alcohol Use: 2.5 oz/week    5 drink(s) per week     Comment: 1 glass of wine 4-5 times week.     Colonoscopy:  PAP:  Bone density:  Lipid panel:  Allergies  Allergen Reactions  . Morphine     REACTION: itching    Current Outpatient Prescriptions  Medication Sig Dispense Refill  . estradiol (VAGIFEM) 25 MCG vaginal tablet Place 1 tablet (25 mcg total) vaginally 2 (two) times a week.  30 tablet  6  . fexofenadine (ALLEGRA) 180 MG tablet Take 180 mg by mouth daily.      . fluticasone (FLONASE) 50 MCG/ACT nasal spray Place 2 sprays into the nose daily.  16 g  8  . Multiple Vitamins-Calcium (VIACTIV MULTI-VITAMIN) CHEW Chew 2 each by mouth daily.      . Polyethylene Glycol 3350 (MIRALAX PO) Take by mouth daily.       . tamoxifen (NOLVADEX) 10 MG tablet Take 2 tablets (20 mg total) by mouth daily.  90 tablet  12  . venlafaxine XR (EFFEXOR-XR) 75 MG 24 hr capsule TAKE 1 CAPSULE BY MOUTH DAILY  90 capsule  3   No current facility-administered medications for this visit.     OBJECTIVE: Middle-aged white woman in no acute distress, pupils equal round and reactive to light Filed Vitals:   02/10/13 1007  BP: 162/70  Pulse: 83  Temp: 98.5 F (36.9 Gamble)  Resp: 20     Body mass index is 22.7 kg/(m^2).    ECOG FS: 0  Sclerae unicteric Oropharynx clear No cervical or supraclavicular adenopathy Lungs no rales or rhonchi Heart regular rate and rhythm Abd soft, nontender, positive bowel sounds MSK no focal spinal tenderness to palpation, specifically over the cervical and lumbosacral spine Neuro: nonfocal, well oriented, stressed affect Breasts: Status post bilateral mastectomies with implant reconstruction. There is no evidence of local recurrence.  LAB RESULTS: Lab  Results  Component Value Date   WBC 4.6 02/03/2013   NEUTROABS 2.1 02/03/2013   HGB 13.5 02/03/2013   HCT 39.3 02/03/2013   MCV 95.1 02/03/2013   PLT 219 02/03/2013      Chemistry      Component Value Date/Time   NA 141 02/03/2013 0957   NA 141 11/06/2012 1406   K 3.9 02/03/2013 0957   K 4.2 11/06/2012 1406   CL 105 11/06/2012 1406   CO2 28 02/03/2013 0957   CO2 29 11/06/2012 1406   BUN 17.0 02/03/2013 0957   BUN 18 11/06/2012 1406   CREATININE 0.8 02/03/2013 0957   CREATININE 0.95 11/06/2012 1406   CREATININE 0.71 02/04/2012 1337      Component Value Date/Time   CALCIUM 10.4 02/03/2013 0957   CALCIUM 10.4 11/06/2012 1406   ALKPHOS 57 02/03/2013 0957   ALKPHOS 57 11/06/2012 1406   AST 24 02/03/2013 0957   AST 21 11/06/2012 1406   ALT 19 02/03/2013 0957   ALT 18 11/06/2012 1406   BILITOT 0.32 02/03/2013 0957   BILITOT 0.3 11/06/2012 1406       Lab Results  Component Value Date   LABCA2 27 02/04/2012    No components found with this basename: [id number]    No results found for this basename: INR,  in the last 168 hours  Urinalysis    Component Value Date/Time   COLORURINE YELLOW 08/26/2012 1405   APPEARANCEUR CLOUDY* 08/26/2012 1405   LABSPEC 1.010 08/26/2012 1405   PHURINE 7.5 08/26/2012  1405   GLUCOSEU NEG 08/26/2012 1405   HGBUR TRACE* 08/26/2012 1405   BILIRUBINUR neg 11/28/2012 1444   BILIRUBINUR NEG 08/26/2012 1405   KETONESUR NEG 08/26/2012 1405   PROTEINUR NEG 08/26/2012 1405   UROBILINOGEN negative 11/28/2012 1444   UROBILINOGEN 0.2 08/26/2012 1405   NITRITE positiv 11/28/2012 1444   NITRITE NEG 08/26/2012 1405   LEUKOCYTESUR small (1+) 11/28/2012 1444    STUDIES: DEXA scan July 2012 showed osteopenia with a T score of -1.7  ASSESSMENT: 52 y.o.  woman status post bilateral mastectomies with subpectoral implant placement July 2008 for a T1c N0, grade 1 invasive ductal carcinoma, strongly estrogen and progesterone receptor positive, HER2/neu negative, Oncotype DX recurrence score of 13, predicting an 8% risk of recurrence after 5 years of tamoxifen.   She started antiestrogens September 2008, initially tamoxifen, then Femara with Lupron, switched to tamoxifen again November 2009, stopped tamoxifen at her own discretion December 2011, resumed July 2012.  PLAN: We discussed her situation extensively today. I am not at all uncomfortable with her stopping anti-estrogens" graduating" from cancer followup. On the other hand she does have significant vaginal atrophy issues, and I would be more comfortable with your using vaginal estrogen preparations if she stayed on tamoxifen. After much discussion we decided she was then tamoxifen another few years and I wrote her a prescription for Vagifem suppositories to insert twice weekly until she has a more normal vaginal lining and no dyspareunia. After that she can go to once a week.  We discussed getting a brain MRI and trying Elavil at bedtime for her headaches but she really does feel these are related to her TMJ and she will continue to work with Dr. Suszanne Conners on that issue. She is going to see me again in one year. She knows to call for any other problems that may develop before her next visit here. Kristen Gamble     02/10/2013

## 2013-02-10 NOTE — Telephone Encounter (Signed)
appts made and printed...td 

## 2013-02-12 MED ORDER — LIDOCAINE HCL 1 % IJ SOLN
INTRAMUSCULAR | Status: AC
  Filled 2013-02-12: qty 20

## 2013-02-13 ENCOUNTER — Other Ambulatory Visit (INDEPENDENT_AMBULATORY_CARE_PROVIDER_SITE_OTHER): Payer: Self-pay | Admitting: Otolaryngology

## 2013-02-13 DIAGNOSIS — M26629 Arthralgia of temporomandibular joint, unspecified side: Secondary | ICD-10-CM

## 2013-02-18 ENCOUNTER — Ambulatory Visit
Admission: RE | Admit: 2013-02-18 | Discharge: 2013-02-18 | Disposition: A | Discharge status: Home / Self Care | Payer: BC Managed Care – PPO | Source: Ambulatory Visit | Attending: Otolaryngology | Admitting: Otolaryngology

## 2013-02-18 DIAGNOSIS — M26629 Arthralgia of temporomandibular joint, unspecified side: Secondary | ICD-10-CM

## 2013-03-18 ENCOUNTER — Other Ambulatory Visit: Payer: Self-pay | Admitting: Oncology

## 2013-03-18 DIAGNOSIS — C50911 Malignant neoplasm of unspecified site of right female breast: Secondary | ICD-10-CM

## 2013-05-13 ENCOUNTER — Ambulatory Visit (INDEPENDENT_AMBULATORY_CARE_PROVIDER_SITE_OTHER): Payer: BC Managed Care – PPO | Admitting: Women's Health

## 2013-05-13 ENCOUNTER — Encounter: Payer: Self-pay | Admitting: Women's Health

## 2013-05-13 DIAGNOSIS — R350 Frequency of micturition: Secondary | ICD-10-CM

## 2013-05-13 DIAGNOSIS — N898 Other specified noninflammatory disorders of vagina: Secondary | ICD-10-CM

## 2013-05-13 DIAGNOSIS — N390 Urinary tract infection, site not specified: Secondary | ICD-10-CM

## 2013-05-13 DIAGNOSIS — L293 Anogenital pruritus, unspecified: Secondary | ICD-10-CM

## 2013-05-13 LAB — URINALYSIS W MICROSCOPIC + REFLEX CULTURE
Casts: NONE SEEN
Crystals: NONE SEEN
Glucose, UA: NEGATIVE mg/dL
Nitrite: NEGATIVE
Protein, ur: 30 mg/dL — AB
Specific Gravity, Urine: 1.01 (ref 1.005–1.030)
pH: 5.5 (ref 5.0–8.0)

## 2013-05-13 LAB — WET PREP FOR TRICH, YEAST, CLUE
Clue Cells Wet Prep HPF POC: NONE SEEN
Trich, Wet Prep: NONE SEEN
WBC, Wet Prep HPF POC: NONE SEEN

## 2013-05-13 MED ORDER — SULFAMETHOXAZOLE-TRIMETHOPRIM 800-160 MG PO TABS: 1.0000 | ORAL_TABLET | Freq: Two times a day (BID) | ORAL | Status: DC

## 2013-05-13 MED ORDER — NITROFURANTOIN MACROCRYSTAL 50 MG PO CAPS: ORAL_CAPSULE | ORAL | Status: DC

## 2013-05-13 NOTE — Progress Notes (Signed)
Patient ID: Kristen Gamble, female   DOB: 1961/06/07, 52 y.o.   MRN: [id number] Presents with three-day history of pain, frequency, urgency, and pain at the end of the stream of urination. Has used Azo for 1 day, noted blood in her urine today. Symptoms started after intercourse. Having extreme vaginal dryness, history of right breast cancer. Recently started on Vagifem per Dr. Darnelle Catalan. Denies vaginal discharge, states vaginal discomfort with irritation. Hysterectomy.  Exam: Appears well. External genitalia extremely erythematous, wet prep negative. UA: Large blood, large leukocytes, TNTC WBCs and many bacteria.  UTI with hematuria Atrophic vaginitis  Plan: Septra DS twice daily for 3 days prescription, proper use given and reviewed. Macrodantin 50 mg with intercourse. Continue Vagifem and replens for vaginal dryness. Instructed to call if no relief of symptoms.

## 2013-05-13 NOTE — Patient Instructions (Signed)
Urinary Tract Infection  Urinary tract infections (UTIs) can develop anywhere along your urinary tract. Your urinary tract is your body's drainage system for removing wastes and extra water. Your urinary tract includes two kidneys, two ureters, a bladder, and a urethra. Your kidneys are a pair of bean-shaped organs. Each kidney is about the size of your fist. They are located below your ribs, one on each side of your spine.  CAUSES  Infections are caused by microbes, which are microscopic organisms, including fungi, viruses, and bacteria. These organisms are so small that they can only be seen through a microscope. Bacteria are the microbes that most commonly cause UTIs.  SYMPTOMS   Symptoms of UTIs may vary by age and gender of the patient and by the location of the infection. Symptoms in Kristen Gamble women typically include a frequent and intense urge to urinate and a painful, burning feeling in the bladder or urethra during urination. Older women and men are more likely to be tired, shaky, and weak and have muscle aches and abdominal pain. A fever may mean the infection is in your kidneys. Other symptoms of a kidney infection include pain in your back or sides below the ribs, nausea, and vomiting.  DIAGNOSIS  To diagnose a UTI, your caregiver will ask you about your symptoms. Your caregiver also will ask to provide a urine sample. The urine sample will be tested for bacteria and white blood cells. White blood cells are made by your body to help fight infection.  TREATMENT   Typically, UTIs can be treated with medication. Because most UTIs are caused by a bacterial infection, they usually can be treated with the use of antibiotics. The choice of antibiotic and length of treatment depend on your symptoms and the type of bacteria causing your infection.  HOME CARE INSTRUCTIONS   If you were prescribed antibiotics, take them exactly as your caregiver instructs you. Finish the medication even if you feel better after you  have only taken some of the medication.   Drink enough water and fluids to keep your urine clear or pale yellow.   Avoid caffeine, tea, and carbonated beverages. They tend to irritate your bladder.   Empty your bladder often. Avoid holding urine for long periods of time.   Empty your bladder before and after sexual intercourse.   After a bowel movement, women should cleanse from front to back. Use each tissue only once.  SEEK MEDICAL CARE IF:    You have back pain.   You develop a fever.   Your symptoms do not begin to resolve within 3 days.  SEEK IMMEDIATE MEDICAL CARE IF:    You have severe back pain or lower abdominal pain.   You develop chills.   You have nausea or vomiting.   You have continued burning or discomfort with urination.  MAKE SURE YOU:    Understand these instructions.   Will watch your condition.   Will get help right away if you are not doing well or get worse.  Document Released: 04/11/2005 Document Revised: 01/01/2012 Document Reviewed: 08/10/2011  [employer] Patient Information 2014 [employer].

## 2013-05-16 DIAGNOSIS — M8580 Other specified disorders of bone density and structure, unspecified site: Secondary | ICD-10-CM

## 2013-05-16 HISTORY — DX: Other specified disorders of bone density and structure, unspecified site: M85.80

## 2013-05-16 LAB — URINE CULTURE: Colony Count: 30000

## 2013-06-01 ENCOUNTER — Other Ambulatory Visit: Payer: Self-pay | Admitting: Gastroenterology

## 2013-06-01 LAB — HM COLONOSCOPY

## 2013-06-05 LAB — HM DEXA SCAN

## 2013-06-18 ENCOUNTER — Encounter: Payer: Self-pay | Admitting: Internal Medicine

## 2013-06-25 ENCOUNTER — Encounter: Payer: Self-pay | Admitting: *Deleted

## 2013-07-20 ENCOUNTER — Ambulatory Visit (INDEPENDENT_AMBULATORY_CARE_PROVIDER_SITE_OTHER): Payer: BC Managed Care – PPO | Admitting: Gynecology

## 2013-07-20 ENCOUNTER — Encounter: Payer: Self-pay | Admitting: Gynecology

## 2013-07-20 DIAGNOSIS — N390 Urinary tract infection, site not specified: Secondary | ICD-10-CM

## 2013-07-20 DIAGNOSIS — N898 Other specified noninflammatory disorders of vagina: Secondary | ICD-10-CM

## 2013-07-20 DIAGNOSIS — R300 Dysuria: Secondary | ICD-10-CM

## 2013-07-20 DIAGNOSIS — N9489 Other specified conditions associated with female genital organs and menstrual cycle: Secondary | ICD-10-CM

## 2013-07-20 LAB — URINALYSIS W MICROSCOPIC + REFLEX CULTURE
BILIRUBIN URINE: NEGATIVE
CRYSTALS: NONE SEEN
Casts: NONE SEEN
Glucose, UA: NEGATIVE mg/dL
KETONES UR: NEGATIVE mg/dL
Nitrite: NEGATIVE
Protein, ur: NEGATIVE mg/dL
SPECIFIC GRAVITY, URINE: 1.02 (ref 1.005–1.030)
UROBILINOGEN UA: 0.2 mg/dL (ref 0.0–1.0)
pH: 5.5 (ref 5.0–8.0)

## 2013-07-20 MED ORDER — ESTRADIOL 10 MCG VA TABS: 1.0000 | ORAL_TABLET | VAGINAL | Status: DC

## 2013-07-20 MED ORDER — SULFAMETHOXAZOLE-TRIMETHOPRIM 800-160 MG PO TABS: 1.0000 | ORAL_TABLET | Freq: Two times a day (BID) | ORAL | Status: DC

## 2013-07-20 NOTE — Patient Instructions (Signed)
Take antibiotics twice daily x7 days. Take 2 pills with intercourse to help prevent recurrent UTI Continue on Vagifem as we discussed.

## 2013-07-20 NOTE — Progress Notes (Signed)
Patient presents with several days of worsening dysuria mild suprapubic pain and low back pain. History of recent treatment end of October by Kristen Gamble for UTI. Was given Macrobid 50 mg poor post coital treatment as she has had several UTIs following intercourse. This episode seemed to follow Replens placement manipulation. Recently started Vagifem per Dr. Jana Gamble for vaginal dryness. Is doing well with this and wants to continue.  Exam Spine straight without CVA tenderness. Abdomen soft nontender without masses guarding rebound organomegaly.  Assessment and plan: 1. UTI by history, symptoms and urinalysis. Will treat with Septra DS 1 by mouth twice a day x7 days. Recommended increasing her Macrodantin to 100 mg post coital. If UTIs continue will refer to urology. Repeat urinalysis next month when she comes in for her annual exam to make sure the hematuria clears. 2. Vaginal dryness/dyspareunia. On Vagifem, approved by her oncologist. I again reviewed the issues and risks to include absorption with stimulation of breast cancer either de novo or recurrent. Patient clearly understands the issues and wants to continue it I refilled her x1 year. She will followup in one month for her annual exam.

## 2013-07-22 ENCOUNTER — Telehealth: Payer: Self-pay | Admitting: *Deleted

## 2013-07-22 LAB — URINE CULTURE: Colony Count: 50000

## 2013-07-22 MED ORDER — FLUCONAZOLE 150 MG PO TABS: 150.0000 mg | ORAL_TABLET | Freq: Once | ORAL | Status: DC

## 2013-07-22 NOTE — Telephone Encounter (Signed)
Pt informed, rx sent 

## 2013-07-22 NOTE — Telephone Encounter (Signed)
Pt was treated for UTI on 07/20/13 now c/o yeast infection. Pt would like Rx for this. Please advise

## 2013-07-22 NOTE — Telephone Encounter (Signed)
Diflucan 150 mg times one dose 

## 2013-08-14 ENCOUNTER — Encounter: Payer: Self-pay | Admitting: Internal Medicine

## 2013-08-31 ENCOUNTER — Ambulatory Visit (INDEPENDENT_AMBULATORY_CARE_PROVIDER_SITE_OTHER): Payer: BC Managed Care – PPO | Admitting: Gynecology

## 2013-08-31 ENCOUNTER — Encounter: Payer: Self-pay | Admitting: Gynecology

## 2013-08-31 VITALS — BP 116/70 | Ht 63.5 in | Wt 131.0 lb

## 2013-08-31 DIAGNOSIS — Z01419 Encounter for gynecological examination (general) (routine) without abnormal findings: Secondary | ICD-10-CM

## 2013-08-31 DIAGNOSIS — N952 Postmenopausal atrophic vaginitis: Secondary | ICD-10-CM

## 2013-08-31 NOTE — Patient Instructions (Signed)
Continue on Vagifem as we discussed. Call me if you have any issues, otherwise followup in one year for annual exam.

## 2013-08-31 NOTE — Progress Notes (Signed)
Kristen Gamble November 09, 1960 [id number]        53 y.o.  G2P2001 for annual exam.  Several issues noted below.  Past medical history,surgical history, problem list, medications, allergies, family history and social history were all reviewed and documented in the EPIC chart.  ROS:  Performed and pertinent positives and negatives are included in the history, assessment and plan .  Exam: Kim assistant Filed Vitals:   08/31/13 1049  BP: 116/70  Height: 5' 3.5" (1.613 m)  Weight: 131 lb (59.421 kg)   General appearance  Normal Skin grossly normal Head/Neck normal with no cervical or supraclavicular adenopathy thyroid normal Lungs  clear Cardiac RR, without RMG Abdominal  soft, nontender, without masses, organomegaly or hernia Chest wall examined lying and sitting. Well-healed bilateral reconstruction without acute changes. No masses or axillary adenopathy. Pelvic  Ext/BUS/vagina  Normal with generalized atrophic changes  Adnexa  Without masses or tenderness    Anus and perineum  Normal   Rectovaginal  Normal sphincter tone without palpated masses or tenderness.    Assessment/Plan:  53 y.o. G23P2001 female for annual exam.   1. History of adenocarcinoma the right breast status post bilateral mastectomy. Stage I historically, estrogen progesterone receptor positive. Currently on tamoxifen. She is actively being followed by Dr. [name] and will continue to see him. 2. Vaginal atrophy/dyspareunia. Was started on Vagifem with Dr. Virgie Dad approval. Doing well with this and wants to continue. We discussed the whole issue of absorption in a breast cancer survivor with positive receptor status. Patient understands the issues and is comfortable continuing. 3. History of dysplasia. Last Pap smear 07/2011. No Pap smear done today. Cryo- surgery age 39 with normal Pap smears since then. She is status post TAH/BSO as part of a risk reduction surgery and I reviewed current screening guidelines as far as  stopping after hysterectomy or doing a less frequent interval. Patient is not comfortable with stopping altogether and we will plan on every three-year screening. 4. Osteopenia. DEXA 05/2013 with T score -1.7. Stable from prior study 2012. She is being followed in Dr. Virgie Dad office. She'll continue to follow up with him in reference to this. Increase calcium and vitamin D recommendations reviewed. 5. Colonoscopy 2014. Repeat at their recommended interval. 6. Health maintenance. No blood work was done today as is all done through her primary's office. I did check a urinalysis. Assuming she continues well then she will see me in a year or sooner as needed.    Note: This document was prepared with digital dictation and possible smart phrase technology. Any transcriptional errors that result from this process are unintentional.   Anastasio Auerbach MD, 11:38 AM 08/31/2013

## 2013-09-01 LAB — URINALYSIS W MICROSCOPIC + REFLEX CULTURE
Bacteria, UA: NONE SEEN
Bilirubin Urine: NEGATIVE
Casts: NONE SEEN
GLUCOSE, UA: NEGATIVE mg/dL
Hgb urine dipstick: NEGATIVE
Ketones, ur: NEGATIVE mg/dL
LEUKOCYTES UA: NEGATIVE
NITRITE: NEGATIVE
PROTEIN: NEGATIVE mg/dL
Specific Gravity, Urine: 1.02 (ref 1.005–1.030)
Urobilinogen, UA: 0.2 mg/dL (ref 0.0–1.0)
pH: 6 (ref 5.0–8.0)

## 2013-10-07 ENCOUNTER — Ambulatory Visit (INDEPENDENT_AMBULATORY_CARE_PROVIDER_SITE_OTHER): Payer: BC Managed Care – PPO | Admitting: Gynecology

## 2013-10-07 ENCOUNTER — Encounter: Payer: Self-pay | Admitting: Gynecology

## 2013-10-07 DIAGNOSIS — R319 Hematuria, unspecified: Secondary | ICD-10-CM

## 2013-10-07 LAB — URINALYSIS W MICROSCOPIC + REFLEX CULTURE
BILIRUBIN URINE: NEGATIVE
CASTS: NONE SEEN
CRYSTALS: NONE SEEN
GLUCOSE, UA: NEGATIVE mg/dL
Ketones, ur: NEGATIVE mg/dL
Leukocytes, UA: NEGATIVE
Nitrite: NEGATIVE
PH: 5 (ref 5.0–8.0)
PROTEIN: 30 mg/dL — AB
SPECIFIC GRAVITY, URINE: 1.02 (ref 1.005–1.030)
Urobilinogen, UA: 0.2 mg/dL (ref 0.0–1.0)
WBC UA: NONE SEEN WBC/hpf (ref <3)

## 2013-10-07 MED ORDER — CIPROFLOXACIN HCL 250 MG PO TABS: 250.0000 mg | ORAL_TABLET | Freq: Two times a day (BID) | ORAL | Status: DC

## 2013-10-07 NOTE — Patient Instructions (Signed)
Start antibiotic pill twice daily for 7 days. Office will contact you to arrange appointment with urology.

## 2013-10-07 NOTE — Progress Notes (Signed)
Kristen Gamble 1960/11/22 [id number]        53 y.o.  G2P2001 presents with the onset of hematuria and some low back pain starting over the last 24 hours. No fever chills nausea vomiting dysuria frequency or pelvic pain. She has had a history of recurrent UTIs in the past year and most recently was treated January 2015. Was placed on Macrobid post coital prophylaxis. States that this does not feel like her usual UTI.  Past medical history,surgical history, problem list, medications, allergies, family history and social history were all reviewed and documented in the EPIC chart.  Exam: General appearance  Normal Spine straight without CVA tenderness Abdomen soft nontender without masses guarding rebound  Assessment/Plan:  [name] urinalysis TNTC RBC few bacteria few epithelial cells. Urine culture pending. Will cover with ciprofloxacin 250 mg twice a day x7 days. Discussed differential to include hemorrhagic UTI although unusual with lack of symptoms, kidney stone nonobstructive given the lack of symptoms were more significant pathology such as bladder or renal neoplasia. Will cover with antibiotics and schedule followup with urology.  Note: This document was prepared with digital dictation and possible smart phrase technology. Any transcriptional errors that result from this process are unintentional.   Anastasio Auerbach MD, 4:59 PM 10/07/2013

## 2013-10-08 ENCOUNTER — Telehealth: Payer: Self-pay | Admitting: *Deleted

## 2013-10-08 NOTE — Telephone Encounter (Signed)
Message copied by Thamas Jaegers on Thu Oct 08, 2013  8:57 AM ------      Message from: Anastasio Auerbach      Created: Wed Oct 07, 2013  4:58 PM       Patient needs appointment with urology, preferably Dr. Gaynelle Arabian in reference to hematuria and history of recurrent UTIs ------

## 2013-10-08 NOTE — Telephone Encounter (Signed)
Appt. On 11/03/13 @ 1:00 pm notes faxed, pt informed.

## 2013-10-09 LAB — URINE CULTURE
Colony Count: NO GROWTH
Organism ID, Bacteria: NO GROWTH

## 2013-10-28 ENCOUNTER — Ambulatory Visit (INDEPENDENT_AMBULATORY_CARE_PROVIDER_SITE_OTHER): Payer: BC Managed Care – PPO | Admitting: Gynecology

## 2013-10-28 ENCOUNTER — Encounter: Payer: Self-pay | Admitting: Gynecology

## 2013-10-28 DIAGNOSIS — R319 Hematuria, unspecified: Secondary | ICD-10-CM

## 2013-10-28 LAB — URINALYSIS W MICROSCOPIC + REFLEX CULTURE
BILIRUBIN URINE: NEGATIVE
CASTS: NONE SEEN
Crystals: NONE SEEN
Glucose, UA: NEGATIVE mg/dL
KETONES UR: NEGATIVE mg/dL
Leukocytes, UA: NEGATIVE
Nitrite: NEGATIVE
PH: 7.5 (ref 5.0–8.0)
Protein, ur: NEGATIVE mg/dL
SPECIFIC GRAVITY, URINE: 1.015 (ref 1.005–1.030)
Urobilinogen, UA: 0.2 mg/dL (ref 0.0–1.0)
WBC UA: NONE SEEN WBC/hpf (ref <3)

## 2013-10-28 MED ORDER — NITROFURANTOIN MONOHYD MACRO 100 MG PO CAPS
100.0000 mg | ORAL_CAPSULE | Freq: Two times a day (BID) | ORAL | Status: DC
Start: 2013-10-28 — End: 2013-11-11

## 2013-10-28 NOTE — Progress Notes (Signed)
Kristen Gamble January 07, 1961 [id number]        53 y.o.  G2P2001 presents with onset of hematuria last night and low back pain. Some mild suprapubic tenderness. History of UTI in January subsequent hematuria 10/07/2013 where followup culture was negative although she was treated with antibiotics and her symptoms resolved. The systemic symptoms such as nausea vomiting diarrhea constipation fever chills  Past medical history,surgical history, problem list, medications, allergies, family history and social history were all reviewed and documented in the EPIC chart.  Directed ROS to system complaints/issues with pertinent positives and negatives documented in the history of present illness/assessment and plan  Exam: Kim assistant General appearance  Normal Spine straight without CVA tenderness. Abdomen soft nontender without masses guarding rebound organomegaly.  Urinalysis with TNTC RBC, negative WBC, rare bacteria  Assessment/Plan:  [name] hematuria, recurrent with current urinalysis showing rare bacteria. Patient already has an appointment with Dr. Gaynelle Arabian, urologist for next week and will followup with him for this. I am going to cover her with antibiotics in the event she does have a UTI contributing to this with Macrobid 100 mg twice a day x7 days. During her episode in March I treated her with ciprofloxacin. Patient will followup with Dr. Gaynelle Arabian for further evaluation.   Note: This document was prepared with digital dictation and possible smart phrase technology. Any transcriptional errors that result from this process are unintentional.   Anastasio Auerbach MD, 10:39 AM 10/28/2013

## 2013-10-28 NOTE — Patient Instructions (Signed)
Take antibiotic twice daily for 7 days. Followup for your appointment with Dr. Gaynelle Arabian next week

## 2013-10-29 LAB — URINE CULTURE
COLONY COUNT: NO GROWTH
Organism ID, Bacteria: NO GROWTH

## 2013-11-11 ENCOUNTER — Encounter (HOSPITAL_COMMUNITY): Payer: Self-pay | Admitting: Pharmacy Technician

## 2013-11-11 ENCOUNTER — Other Ambulatory Visit: Payer: Self-pay | Admitting: Urology

## 2013-11-11 NOTE — Patient Instructions (Addendum)
Kristen Gamble  11/11/2013                           YOUR PROCEDURE IS SCHEDULED ON: 11/13/13 at 7:30 am               PLEASE REPORT TO SHORT STAY CENTER AT :  5:30 AM               CALL THIS NUMBER IF ANY PROBLEMS THE DAY OF SURGERY :               [phone]                                REMEMBER:   Do not eat food or drink liquids AFTER MIDNIGHT               Take these medicines the morning of surgery with               A SIPS OF WATER :    FLONASE     Do not wear jewelry, make-up   Do not wear lotions, powders, or perfumes.   Do not shave legs or underarms 12 hrs. before surgery (men may shave face)  Do not bring valuables to the hospital.  Contacts, dentures or bridgework may not be worn into surgery.  Leave suitcase in the car. After surgery it may be brought to your room.  For patients admitted to the hospital more than one night, checkout time is            11:00 AM                                                       The day of discharge.   Patients discharged the day of surgery will not be allowed to drive home.            If going home same day of surgery, must have someone stay with you              FIRST 24 hrs at home and arrange for some one to drive you              home from hospital.   ________________________________________________________________________                                                          [facility] - Preparing for Surgery Before surgery, you can play an important role.  Because skin is not sterile, your skin needs to be as free of germs as possible.  You can reduce the number of germs on your skin by washing with CHG (chlorahexidine gluconate) soap before surgery.  CHG is an antiseptic cleaner which kills germs and bonds with the skin to continue killing germs even after washing. Please DO NOT use if you have an allergy to CHG or antibacterial soaps.  If your skin becomes reddened/irritated stop using the CHG and inform  your nurse when you arrive at Short Stay. Do not shave (including legs and underarms)  for at least 48 hours prior to the first CHG shower.  You may shave your face. Please follow these instructions carefully:  1.  Shower with CHG Soap the night before surgery and the  morning of Surgery.   2.  If you choose to wash your hair, wash your hair first as usual with your  normal  Shampoo.   3.  After you shampoo, rinse your hair and body thoroughly to remove the  shampoo.                                         4.  Use CHG as you would any other liquid soap.  You can apply chg directly  to the skin and wash . Gently wash with scrungie or clean wascloth    5.  Apply the CHG Soap to your body ONLY FROM THE NECK DOWN.   Do not use on open                           Wound or open sores. Avoid contact with eyes, ears mouth and genitals (private parts).                        Genitals (private parts) with your normal soap.              6.  Wash thoroughly, paying special attention to the area where your surgery  will be performed.   7.  Thoroughly rinse your body with warm water from the neck down.   8.  DO NOT shower/wash with your normal soap after using and rinsing off  the CHG Soap .                9.  Pat yourself dry with a clean towel.             10.  Wear clean pajamas.             11.  Place clean sheets on your bed the night of your first shower and do not  sleep with pets.  Day of Surgery : Do not apply any lotions/deodorants the morning of surgery.  Please wear clean clothes to the hospital/surgery center.  FAILURE TO FOLLOW THESE INSTRUCTIONS MAY RESULT IN THE CANCELLATION OF YOUR SURGERY    PATIENT SIGNATURE_________________________________

## 2013-11-11 NOTE — Progress Notes (Signed)
Surgery on 11/13/13.  Preop on 11/12/13.  Need orders in EPIC.  Thank You.

## 2013-11-12 ENCOUNTER — Encounter (HOSPITAL_COMMUNITY): Payer: Self-pay

## 2013-11-12 ENCOUNTER — Encounter (HOSPITAL_COMMUNITY)
Admission: RE | Admit: 2013-11-12 | Discharge: 2013-11-12 | Disposition: A | Discharge status: Home / Self Care | Payer: BC Managed Care – PPO | Source: Ambulatory Visit | Attending: Urology | Admitting: Urology

## 2013-11-12 ENCOUNTER — Encounter (INDEPENDENT_AMBULATORY_CARE_PROVIDER_SITE_OTHER): Payer: Self-pay

## 2013-11-12 HISTORY — DX: Anesthesia of skin: R20.0

## 2013-11-12 HISTORY — DX: Adverse effect of unspecified anesthetic, initial encounter: T41.45XA

## 2013-11-12 HISTORY — DX: Other complications of anesthesia, initial encounter: T88.59XA

## 2013-11-12 HISTORY — DX: Personal history of urinary calculi: Z87.442

## 2013-11-12 LAB — BASIC METABOLIC PANEL
BUN: 17 mg/dL (ref 6–23)
CO2: 29 mEq/L (ref 19–32)
CREATININE: 0.65 mg/dL (ref 0.50–1.10)
Calcium: 11 mg/dL — ABNORMAL HIGH (ref 8.4–10.5)
Chloride: 101 mEq/L (ref 96–112)
GFR calc non Af Amer: >90 mL/min (ref >90)
Glucose, Bld: 102 mg/dL — ABNORMAL HIGH (ref 70–99)
Potassium: 4.4 mEq/L (ref 3.7–5.3)
Sodium: 141 mEq/L (ref 137–147)

## 2013-11-12 LAB — CBC
HCT: 40.5 % (ref 36.0–46.0)
Hemoglobin: 13.7 g/dL (ref 12.0–15.0)
MCH: 31.7 pg (ref 26.0–34.0)
MCHC: 33.8 g/dL (ref 30.0–36.0)
MCV: 93.8 fL (ref 78.0–100.0)
Platelets: 215 10*3/uL (ref 150–400)
RBC: 4.32 MIL/uL (ref 3.87–5.11)
RDW: 13 % (ref 11.5–15.5)
WBC: 4.3 10*3/uL (ref 4.0–10.5)

## 2013-11-13 ENCOUNTER — Encounter (HOSPITAL_COMMUNITY): Payer: Self-pay | Admitting: *Deleted

## 2013-11-13 ENCOUNTER — Encounter (HOSPITAL_COMMUNITY)
Admission: RE | Disposition: A | Discharge status: Home / Self Care | Payer: Self-pay | Source: Ambulatory Visit | Attending: Urology

## 2013-11-13 ENCOUNTER — Encounter (HOSPITAL_COMMUNITY): Payer: BC Managed Care – PPO | Admitting: *Deleted

## 2013-11-13 ENCOUNTER — Ambulatory Visit (HOSPITAL_COMMUNITY): Payer: BC Managed Care – PPO | Admitting: *Deleted

## 2013-11-13 ENCOUNTER — Ambulatory Visit (HOSPITAL_COMMUNITY)
Admission: RE | Admit: 2013-11-13 | Discharge: 2013-11-13 | Disposition: A | Discharge status: Home / Self Care | Payer: BC Managed Care – PPO | Source: Ambulatory Visit | Attending: Urology | Admitting: Urology

## 2013-11-13 DIAGNOSIS — Z885 Allergy status to narcotic agent status: Secondary | ICD-10-CM | POA: Insufficient documentation

## 2013-11-13 DIAGNOSIS — Z9010 Acquired absence of unspecified breast and nipple: Secondary | ICD-10-CM | POA: Insufficient documentation

## 2013-11-13 DIAGNOSIS — N201 Calculus of ureter: Secondary | ICD-10-CM | POA: Insufficient documentation

## 2013-11-13 DIAGNOSIS — M129 Arthropathy, unspecified: Secondary | ICD-10-CM | POA: Insufficient documentation

## 2013-11-13 DIAGNOSIS — N3020 Other chronic cystitis without hematuria: Secondary | ICD-10-CM | POA: Insufficient documentation

## 2013-11-13 DIAGNOSIS — Z853 Personal history of malignant neoplasm of breast: Secondary | ICD-10-CM | POA: Insufficient documentation

## 2013-11-13 DIAGNOSIS — B977 Papillomavirus as the cause of diseases classified elsewhere: Secondary | ICD-10-CM | POA: Insufficient documentation

## 2013-11-13 DIAGNOSIS — Z79899 Other long term (current) drug therapy: Secondary | ICD-10-CM | POA: Insufficient documentation

## 2013-11-13 HISTORY — PX: CYSTOSCOPY/RETROGRADE/URETEROSCOPY/STONE EXTRACTION WITH BASKET: SHX5317

## 2013-11-13 SURGERY — CYSTOSCOPY, WITH CALCULUS REMOVAL USING BASKET
Anesthesia: General | Site: Bladder | Laterality: Right

## 2013-11-13 MED ORDER — PROPOFOL 10 MG/ML IV BOLUS
INTRAVENOUS | Status: AC
  Filled 2013-11-13: qty 20

## 2013-11-13 MED ORDER — PROPOFOL 10 MG/ML IV BOLUS
INTRAVENOUS | Status: DC | PRN
  Administered 2013-11-13: 160 mg via INTRAVENOUS
  Administered 2013-11-13 (×2): 20 mg via INTRAVENOUS

## 2013-11-13 MED ORDER — LIDOCAINE HCL (CARDIAC) 20 MG/ML IV SOLN
INTRAVENOUS | Status: DC | PRN
  Administered 2013-11-13: 60 mg via INTRAVENOUS

## 2013-11-13 MED ORDER — PROMETHAZINE HCL 25 MG/ML IJ SOLN: 6.2500 mg | INTRAMUSCULAR | Status: DC | PRN

## 2013-11-13 MED ORDER — METOCLOPRAMIDE HCL 5 MG/ML IJ SOLN
INTRAMUSCULAR | Status: AC
  Filled 2013-11-13: qty 2

## 2013-11-13 MED ORDER — HYDROMORPHONE HCL PF 1 MG/ML IJ SOLN: 0.2500 mg | INTRAMUSCULAR | Status: DC | PRN

## 2013-11-13 MED ORDER — BELLADONNA ALKALOIDS-OPIUM 16.2-60 MG RE SUPP
RECTAL | Status: AC
  Filled 2013-11-13: qty 1

## 2013-11-13 MED ORDER — IOHEXOL 300 MG/ML  SOLN
INTRAMUSCULAR | Status: DC | PRN
  Administered 2013-11-13: 3 mL via URETHRAL

## 2013-11-13 MED ORDER — SODIUM CHLORIDE 0.9 % IR SOLN
Status: DC | PRN
  Administered 2013-11-13: 3000 mL
  Administered 2013-11-13: 1000 mL

## 2013-11-13 MED ORDER — DEXAMETHASONE SODIUM PHOSPHATE 4 MG/ML IJ SOLN
INTRAMUSCULAR | Status: DC | PRN
  Administered 2013-11-13: 10 mg via INTRAVENOUS

## 2013-11-13 MED ORDER — OXYCODONE-ACETAMINOPHEN 5-325 MG PO TABS: 1.0000 | ORAL_TABLET | ORAL | Status: DC | PRN

## 2013-11-13 MED ORDER — ONDANSETRON HCL 8 MG PO TABS: 8.0000 mg | ORAL_TABLET | Freq: Three times a day (TID) | ORAL | Status: DC | PRN

## 2013-11-13 MED ORDER — LACTATED RINGERS IV SOLN
INTRAVENOUS | Status: DC | PRN
  Administered 2013-11-13: 07:00:00 via INTRAVENOUS

## 2013-11-13 MED ORDER — MIDAZOLAM HCL 5 MG/5ML IJ SOLN
INTRAMUSCULAR | Status: DC | PRN
  Administered 2013-11-13: 2 mg via INTRAVENOUS

## 2013-11-13 MED ORDER — ONDANSETRON HCL 4 MG/2ML IJ SOLN
INTRAMUSCULAR | Status: AC
  Filled 2013-11-13: qty 2

## 2013-11-13 MED ORDER — METOCLOPRAMIDE HCL 5 MG/ML IJ SOLN
INTRAMUSCULAR | Status: DC | PRN
  Administered 2013-11-13: 10 mg via INTRAVENOUS

## 2013-11-13 MED ORDER — ONDANSETRON HCL 4 MG/2ML IJ SOLN
INTRAMUSCULAR | Status: DC | PRN
  Administered 2013-11-13: 4 mg via INTRAVENOUS

## 2013-11-13 MED ORDER — FENTANYL CITRATE 0.05 MG/ML IJ SOLN
INTRAMUSCULAR | Status: AC
  Filled 2013-11-13: qty 5

## 2013-11-13 MED ORDER — KETOROLAC TROMETHAMINE 30 MG/ML IJ SOLN
INTRAMUSCULAR | Status: DC | PRN
  Administered 2013-11-13: 30 mg via INTRAVENOUS

## 2013-11-13 MED ORDER — MIDAZOLAM HCL 2 MG/2ML IJ SOLN
INTRAMUSCULAR | Status: AC
  Filled 2013-11-13: qty 2

## 2013-11-13 MED ORDER — CEFAZOLIN SODIUM-DEXTROSE 2-3 GM-% IV SOLR
2.0000 g | INTRAVENOUS | Status: AC
Start: 2013-11-13 — End: 2013-11-13
  Administered 2013-11-13: 2 g via INTRAVENOUS

## 2013-11-13 MED ORDER — LACTATED RINGERS IV SOLN: INTRAVENOUS | Status: DC

## 2013-11-13 MED ORDER — DEXAMETHASONE SODIUM PHOSPHATE 10 MG/ML IJ SOLN
INTRAMUSCULAR | Status: AC
  Filled 2013-11-13: qty 1

## 2013-11-13 MED ORDER — FENTANYL CITRATE 0.05 MG/ML IJ SOLN
INTRAMUSCULAR | Status: DC | PRN
  Administered 2013-11-13: 25 ug via INTRAVENOUS
  Administered 2013-11-13: 100 ug via INTRAVENOUS
  Administered 2013-11-13: 25 ug via INTRAVENOUS

## 2013-11-13 MED ORDER — LIDOCAINE HCL 2 % EX GEL
CUTANEOUS | Status: AC
  Filled 2013-11-13: qty 10

## 2013-11-13 MED ORDER — CEFAZOLIN SODIUM-DEXTROSE 2-3 GM-% IV SOLR
INTRAVENOUS | Status: AC
  Filled 2013-11-13: qty 50

## 2013-11-13 MED ORDER — KETOROLAC TROMETHAMINE 30 MG/ML IJ SOLN
INTRAMUSCULAR | Status: AC
  Filled 2013-11-13: qty 1

## 2013-11-13 MED ORDER — ACETAMINOPHEN 10 MG/ML IV SOLN
1000.0000 mg | Freq: Once | INTRAVENOUS | Status: AC
  Administered 2013-11-13: 1000 mg via INTRAVENOUS
  Filled 2013-11-13: qty 100

## 2013-11-13 SURGICAL SUPPLY — 16 items
BAG URO CATCHER STRL LF (DRAPE) ×2 IMPLANT
BASKET STNLS GEMINI 4WIRE 3FR (BASKET) IMPLANT
BASKET ZERO TIP NITINOL 2.4FR (BASKET) ×1 IMPLANT
BSKT STON RTRVL GEM 120X11 3FR (BASKET)
BSKT STON RTRVL ZERO TP 2.4FR (BASKET) ×1
CATH INTERMIT  6FR 70CM (CATHETERS) ×2 IMPLANT
CLOTH BEACON ORANGE TIMEOUT ST (SAFETY) ×2 IMPLANT
DRAPE CAMERA CLOSED 9X96 (DRAPES) ×2 IMPLANT
GLOVE BIOGEL M STRL SZ7.5 (GLOVE) ×2 IMPLANT
GOWN STRL REUS W/TWL XL LVL3 (GOWN DISPOSABLE) ×2 IMPLANT
GUIDEWIRE STR DUAL SENSOR (WIRE) ×2 IMPLANT
IV NS 1000ML (IV SOLUTION)
IV NS 1000ML BAXH (IV SOLUTION) ×1 IMPLANT
MANIFOLD NEPTUNE II (INSTRUMENTS) ×2 IMPLANT
PACK CYSTO (CUSTOM PROCEDURE TRAY) ×2 IMPLANT
TUBING CONNECTING 10 (TUBING) ×2 IMPLANT

## 2013-11-13 NOTE — Discharge Instructions (Signed)
Diet for Kidney Stones Kidney stones are small, hard masses that form inside your kidneys. They are made up of salts and minerals and often form when high levels build up in the urine. The minerals can then start to build up, crystalize, and stick together to form stones. There are several different types of kidney stones. The following types of stones may be influenced by dietary factors:   Calcium Oxalate Stones. An oxalate is a salt found in certain foods. Within the body, calcium can combine with oxalates to form calcium oxalate stones, which can be excreted in the urine in high amounts. This is the most common type of kidney stone.  Calcium Phosphate Stones. These stones may occur when the pH of the urine becomes too high, or less acidic, from too much calcium being excreted in the urine. The pH is a measure of how acidic or basic a substance is.  Uric Acid Stones. This type of stone occurs when the pH of the urine becomes too low, or very acidic, because substances called purines build up in the urine. Purines are found in animal proteins. When the urine is highly concentrated with acid, uric acid kidney stones can form.  Other risk factors for kidney stones include genetics, environment, and being overweight. Your caregiver may ask you to follow specific diet guidelines based on the type of stone you have to lessen the chances of your body making more kidney stones.  GENERAL GUIDELINES FOR ALL TYPES OF STONES  Drink plenty of fluid. Drink 12 16 cups of fluid a day, drinking mainly water.This is the most important thing you can do to prevent the formation of future kidney stones.  Maintain a healthy weight. Your caregiver or dietitian can help you determine what a healthy weight is for you. If you are overweight, weight loss may help prevent the formation of future kidney stones.  Eat a diet adequate in animal protein. Too much animal protein can contribute to the formation of stones. Your  dietitian can help you determine how much protein you should be eating. Avoid low carbohydrate, high protein diets.  Follow a balanced eating approach. The DASH diet, which stands for "Dietary Approaches to Stop Hypertension," is an effective meal plan for reducing stone formation. This diet is high in fruits, vegetables, dairy, and whole grains and low in animal protein. Ask your caregiver or dietitian for information about the DASH diet. ADDITIONAL DIET GUIDELINES FOR CALCIUM STONES Avoid foods high in salt. This includes table salt, salt seasonings, MSG, soy sauce, cured and processed meats, salted crackers and snack foods, fast food, and canned soups and foods. Ask your caregiver or dietitian for information about reducing sodium in your diet or following the low sodium diet.  Ensure adequate calcium intake. Use the following table for calcium guidelines:  Men 42 years old and younger  1000 mg/day.  Men 54 years old and older  1500 mg/day.  Women 93 52 years old  1000 mg/day.  Women 50 years and older  1500 mg/day. Your dietitian can help you determine if you are getting enough calcium in your diet. Foods that are high in calcium include dairy products, broccoli, cheese, yogurt, and pudding. If you need to take a calcium supplement, take it only in the form of calcium citrate.  Avoid foods high in oxalate. Be sure that any supplements you take do not contain more than 500 mg of vitamin C. Vitamin C is converted into oxalate in the body. You do  not need to avoid fruits and vegetables high in vitamin C.   Grains: High-fiber or bran cereal, whole-wheat bread, grits, barley, buckwheat, amaranth, pretzels, and fruitcake.  Vegetables: Dried beans, wax beans, dark leafy greens, eggplant, leeks, okra, parsley, rutabaga, tomato paste, watercress, zucchini, and escarole.  Fruit: Dried apricots, red currants, figs, kiwi, and rhubarb.  Meat and Meat Substitutes: Soybeans and foods made from soy  (soyburger, miso), dried beans, peanut butter.  Milk: Chocolate milk mixes and soymilk.  Fats and Oils: Nuts (peanuts, almonds, pecans, cashews, hazelnuts) and nut butters, sesame seeds, and tDahini paste.  Condiments/Miscellaneous: Chocolate, carob, marmalade, poppy seeds, instant iced tea, and juice from high-oxalate fruits.  Document Released: 10/27/2010 Document Revised: 01/01/2012 Document Reviewed: 12/17/2011 [facility] Patient Information 2014 [place].  Ureteral Colic (Kidney Stones) Ureteral colic is the result of a condition when kidney stones form inside the kidney. Once kidney stones are formed they may move into the tube that connects the kidney with the bladder (ureter). If this occurs, this condition may cause pain (colic) in the ureter.  CAUSES  Pain is caused by stone movement in the ureter and the obstruction caused by the stone. SYMPTOMS  The pain comes and goes as the ureter contracts around the stone. The pain is usually intense, sharp, and stabbing in character. The location of the pain may move as the stone moves through the ureter. When the stone is near the kidney the pain is usually located in the back and radiates to the belly (abdomen). When the stone is ready to pass into the bladder the pain is often located in the lower abdomen on the side the stone is located. At this location, the symptoms may mimic those of a urinary tract infection with urinary frequency. Once the stone is located here it often passes into the bladder and the pain disappears completely. TREATMENT   Your caregiver will provide you with medicine for pain relief.  You may require specialized follow-up X-rays.  The absence of pain does not always mean that the stone has passed. It may have just stopped moving. If the urine remains completely obstructed, it can cause loss of kidney function or even complete destruction of the involved kidney. It is your responsibility and in your interest  that X-rays and follow-ups as suggested by your caregiver are completed. Relief of pain without passage of the stone can be associated with severe damage to the kidney, including loss of kidney function on that side.  If your stone does not pass on its own, additional measures may be taken by your caregiver to ensure its removal. HOME CARE INSTRUCTIONS   Increase your fluid intake. Water is the preferred fluid since juices containing vitamin C may acidify the urine making it less likely for certain stones (uric acid stones) to pass.  Strain all urine. A strainer will be provided. Keep all particulate matter or stones for your caregiver to inspect.  Take your pain medicine as directed.  Make a follow-up appointment with your caregiver as directed.  Remember that the goal is passage of your stone. The absence of pain does not mean the stone is gone. Follow your caregiver's instructions.  Only take over-the-counter or prescription medicines for pain, discomfort, or fever as directed by your caregiver. SEEK MEDICAL CARE IF:   Pain cannot be controlled with the prescribed medicine.  You have a fever.  Pain continues for longer than your caregiver advises it should.  There is a change in the pain, and  you develop chest discomfort or constant abdominal pain.  You feel faint or pass out. MAKE SURE YOU:   Understand these instructions.  Will watch your condition.  Will get help right away if you are not doing well or get worse. Document Released: 04/11/2005 Document Revised: 10/27/2012 Document Reviewed: 12/27/2010 [facility] Patient Information 2014 [place], [place].

## 2013-11-13 NOTE — Transfer of Care (Signed)
Immediate Anesthesia Transfer of Care Note  Patient: Kristen Gamble  Procedure(s) Performed: Procedure(s): CYSTOSCOPY, RIGHT RETROGRADE PYLOGRAM WITH INTERPRETATION, RIGHT URETEROSCOPY WITH DILATION OF URETERAL STRICTURE WITH BASKET EXTRACTION OF STONE (Right)  Patient Location: PACU  Anesthesia Type:General  Level of Consciousness: sedated  Airway & Oxygen Therapy: Patient Spontanous Breathing and Patient connected to face mask oxygen  Post-op Assessment: Report given to PACU RN and Post -op Vital signs reviewed and stable  Post vital signs: Reviewed and stable  Complications: No apparent anesthesia complications

## 2013-11-13 NOTE — Anesthesia Preprocedure Evaluation (Signed)
Anesthesia Evaluation  Patient identified by MRN, date of birth, ID band Patient awake    Reviewed: Allergy & Precautions, H&P , NPO status , Patient's Chart, lab work & pertinent test results  History of Anesthesia Complications (+) PONV and history of anesthetic complications  Airway Mallampati: II TM Distance: >3 FB Neck ROM: Full    Dental  (+) Teeth Intact, Dental Advisory Given   Pulmonary neg pulmonary ROS,  breath sounds clear to auscultation        Cardiovascular negative cardio ROS  Rhythm:Regular Rate:Normal     Neuro/Psych negative neurological ROS  negative psych ROS   GI/Hepatic negative GI ROS, Neg liver ROS,   Endo/Other  negative endocrine ROS  Renal/GU negative Renal ROS  negative genitourinary   Musculoskeletal negative musculoskeletal ROS (+)   Abdominal   Peds  Hematology negative hematology ROS (+)   Anesthesia Other Findings History of Breast CA; Bilateral mastectomy  Reproductive/Obstetrics                           Anesthesia Physical Anesthesia Plan  ASA: II  Anesthesia Plan: General   Post-op Pain Management:    Induction: Intravenous  Airway Management Planned: LMA  Additional Equipment:   Intra-op Plan:   Post-operative Plan: Extubation in OR  Informed Consent: I have reviewed the patients History and Physical, chart, labs and discussed the procedure including the risks, benefits and alternatives for the proposed anesthesia with the patient or authorized representative who has indicated his/her understanding and acceptance.   Dental advisory given  Plan Discussed with: CRNA  Anesthesia Plan Comments:         Anesthesia Quick Evaluation

## 2013-11-13 NOTE — Interval H&P Note (Signed)
History and Physical Interval Note:  11/13/2013 7:32 AM  Kristen Gamble  has presented today for surgery, with the diagnosis of RIGHT URETERAL STONE  The various methods of treatment have been discussed with the patient and family. After consideration of risks, benefits and other options for treatment, the patient has consented to  Procedure(s): CYSTOSCOPY BILATERAL RETROGRADE PYELOGRAM RIGHT URETEROSCOPY BACKSTOP/ BASKET EXTRACTION POSSIBLE LASER AND POSSIBLE STENT (Right) as a surgical intervention .  The patient's history has been reviewed, patient examined, no change in status, stable for surgery.  I have reviewed the patient's chart and labs.  Questions were answered to the patient's satisfaction.     Ailene Rud

## 2013-11-13 NOTE — Anesthesia Postprocedure Evaluation (Signed)
Anesthesia Post Note  Patient: Kristen Gamble  Procedure(s) Performed: Procedure(s) (LRB): CYSTOSCOPY, RIGHT RETROGRADE PYLOGRAM WITH INTERPRETATION, RIGHT URETEROSCOPY WITH DILATION OF URETERAL STRICTURE WITH BASKET EXTRACTION OF STONE (Right)  Anesthesia type: General  Patient location: PACU  Post pain: Pain level controlled  Post assessment: Post-op Vital signs reviewed  Last Vitals:  Filed Vitals:   11/13/13 1040  BP: 96/53  Pulse: 59  Temp:   Resp: 16    Post vital signs: Reviewed  Level of consciousness: sedated  Complications: No apparent anesthesia complications

## 2013-11-13 NOTE — H&P (Signed)
Reason For Visit Cystoscopy & to review CT results   Active Problems Problems  1. Chronic cystitis (595.2) 2. Gross hematuria (599.71) 3. Pain, flank, bilateral (789.09)  History of Present Illness    53 yo married female, P 2-2-0, returns today for a cystoscopy & to review CT results for hx of gross hematuria. Originally referred by Dr. [name] for further evaluation of gross hematuria, and bilateral flank pain. . She has a hx of recurrent UTI's & was placed on Macrobid post-coital. Her cultures have been negative. She has had ca++ oxalate crystals in her urine, however. U/a review also shows multiple contaminants, including squamous cells.    She has a hx of breast cancer, s/p bilateral mastectomy & bilateral breast implants. She is currently using Vagifem/Tamoxifem. She has significant dyspareunia 2ndary vaginal atrophy.    She has stress incontinence with jumping, and urge incontinence. Her symptoms are not bad enough to warrant investigation yet. she uses no tobacco. Weight: 129, 5'4".     10/28/13 Urine culture - negative  10/07/13 Urine culture - negative   Past Medical History Problems  1. History of arthritis (V13.4) 2. History of malignant neoplasm of breast (V10.3) 3. 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 History of Breast Biopsy During Breast Surgery 2. History of Breast Biopsy During Breast Surgery 3. History of Breast Reconstruction With Implant Prosthesis 4. History of Breast Reconstruction With Implant Prosthesis 5. History of Breast Surgery 6. History of Breast Surgery Enlargement Procedure Bilateral 7. History of Breast Surgery Lumpectomy 8. History of Breast Surgery Mastectomy 9. History of Foot Surgery 10. History of Tonsillectomy 11. History of Total Abdominal Hysterectomy With Removal Of Both Ovaries  Current Meds 1. Allegra ODT 30 MG TBDP;  Therapy: (Recorded:21Apr2015) to Recorded 2. Flonase SUSP;  Therapy:  (Recorded:21Apr2015) to Recorded 3. MiraLax PACK;  Therapy: (Recorded:21Apr2015) to Recorded 4. Multiple Vitamin TABS;  Therapy: (Recorded:21Apr2015) to Recorded 5. Tamoxifen Citrate 10 MG Oral Tablet;  Therapy: (Recorded:21Apr2015) to Recorded 6. Vagifem TABS;  Therapy: (Recorded:21Apr2015) to Recorded  Allergies Medication  1. Morphine Derivatives  Family History Problems  1. No pertinent family history  Social History Problems  1. Alcohol use (V49.89)   1 per day 2. Caffeine use (V49.89)   1 per day 3. Father deceased 39. Married 5. Never a smoker 6. Number of children   3 sons, 2 daughters (2 live births, 1 son/1 daughter) 62. Occupation   Self-employed  Review of Systems Genitourinary, constitutional, skin, eye, otolaryngeal, hematologic/lymphatic, cardiovascular, pulmonary, endocrine, musculoskeletal, gastrointestinal, neurological and psychiatric system(s) were reviewed and pertinent findings if present are noted.  Genitourinary: urinary frequency, urinary urgency, dysuria, nocturia, incontinence, hematuria and dyspareunia, but no urinary hesitancy, urinary stream does not start and stop, no incomplete emptying of bladder and initiating urination does not require straining.  Gastrointestinal: nausea and constipation, but no vomiting, no heartburn and no diarrhea.  Constitutional: night sweats and feeling tired (fatigue), but no fever and no recent weight loss.  Integumentary: no new skin rashes or lesions and no pruritus.  Eyes: no blurred vision and no diplopia.  ENT: sinus problems, but no sore throat.  Hematologic/Lymphatic: no tendency to easily bruise and no swollen glands.  Cardiovascular: no chest pain and no leg swelling.  Respiratory: no shortness of breath and no cough.  Endocrine: no polydipsia.  Musculoskeletal: back pain and joint pain.  Neurological: no dizziness and no headache.  Psychiatric: no depression and no anxiety.    Vitals Vital  Signs  [Data Includes: Last 1 Day]  Recorded: 29Apr2015 10:14AM  Blood Pressure: 110 / 71 Temperature: 97.4 F Heart Rate: 76  Physical Exam Constitutional: Well nourished and well developed . No acute distress.  ENT:. The ears and nose are normal in appearance.  Neck: The appearance of the neck is normal and no neck mass is present.  Pulmonary: No respiratory distress and normal respiratory rhythm and effort.  Cardiovascular: Heart rate and rhythm are normal . No peripheral edema.  Abdomen: The abdomen is soft and nontender. No masses are palpated. No CVA tenderness. No hernias are palpable. No hepatosplenomegaly noted.  Genitourinary:  Chaperone Present: kim lewis.  Examination of the external genitalia shows normal female external genitalia and no lesions. The urethra is normal in appearance and not tender. There is no urethral mass. Vaginal exam demonstrates no abnormalities. The adnexa are palpably normal. The bladder is non tender and not distended. The anus is normal on inspection. The perineum is normal on inspection.  Lymphatics: The femoral and inguinal nodes are not enlarged or tender.  Skin: Normal skin turgor, no visible rash and no visible skin lesions.  Neuro/Psych:. Mood and affect are appropriate.    Results/Data Selected Results  AU CT-HEMATURIA PROTOCOL 23Apr2015 12:00AM Carolan Clines   Test Name Result Flag Reference  AU CT-HEMATURIA PROTOCOL (Report)    ** RADIOLOGY REPORT BY [facility] **   CLINICAL DATA: Gross hematuria, bilateral flank pain. Prior history of breast cancer.  EXAM: CT ABDOMEN AND PELVIS WITHOUT AND WITH CONTRAST  TECHNIQUE: Multidetector CT imaging of the abdomen and pelvis was performed without contrast material in one or both body regions, followed by contrast material(s) and further sections in one or both body regions.  CONTRAST: 125 cc Isovue  COMPARISON: None.  FINDINGS: Renal: 3 mm calculus in the lower pole of the  left kidney. Within the distal right ureter, there is a thin 3 mm calculus (image 72) which is approximately 1 cm from the vascular junction. Mild hydroureter on the right. No hydronephrosis. Cortical phase imaging demonstrates no enhancing renal cortical lesion. There is a round low-density lesion in the upper pole of the left kidney measuring 6 mm which is nonenhancing (Bosniak 1). Delayed pyelogram phase imaging demonstrates no filling defects within the collecting systems or ureters.  Lung bases are clear. Subglandular breast implants are noted.  There is no focal hepatic lesion. The gallbladder, pancreas, spleen, and adrenal glands are normal.  The stomach, small bowel, appendix, and cecum normal. The colon and rectosigmoid colon are normal.  Abdominal aorta is normal caliber. No retroperitoneal periportal lymphadenopathy.  No free fluid the pelvis. The bladder calculi.  No aggressive osseous lesion.  IMPRESSION: 1. Minimally obstructive calculus within the distal right ureter just proximal to the vesicoureteral junction. 2. Left nephrolithiasis.   Electronically Signed  By: Suzy Bouchard M.D.  On: 11/05/2013 16:55   Procedure  Procedure: Cystoscopy   Indication: Hematuria.  Informed Consent: Risks, benefits, and potential adverse events were discussed and informed consent was obtained from the patient.  Prep: The patient was prepped with betadine.  Anesthesia:. Local anesthesia was administered intraurethrally with 2% lidocaine jelly.  Antibiotic prophylaxis: Ciprofloxacin.  Procedure Note:  Bladder:    Assessment Assessed  1. Right ureteral stone (592.1)  53 yo female wit bilateral flank pain for 1 month, and CT findings of Right hydronephrosis, and 47mm stone in the Right lower ureter with out progression. She has a L renal stone also. She will  be going out of town next week, and will need stone extraction prior to that.   Plan Right ureteral stone  1.  Follow-up Office  Follow-up  Status: Hold For - Date of Service  Requested for:  01Jun2015  Cysto bilateral retrograde pyelogram, and right ureteroscopy and removal right lower ureteral stone.   Discussion/Summary cc: Donalynn Furlong, MD   cc: Dr. Rita Ohara  cc: Dr, Gus Magrinat     Signatures Electronically signed by : Carolan Clines, M.D.; Nov 11 2013  5:10PM EST

## 2013-11-13 NOTE — Op Note (Signed)
Pre-operative diagnosis :   Distal right ureteral calculus, nonprogressive  Postoperative diagnosis: Same plus distal right ureterovesical junction stenosis  Operation:  Cystourethroscopy right retrograde Pyelogram with interpretation, dilation of right ureterovesical junction stenosis, basket extraction of lower right ureteral calculus (3 mm).  Surgeon:  Chauncey Cruel. Gaynelle Arabian, MD  First assistant:  None  Anesthesia:  General LMA  Preparation:  After appropriate preanesthesia, the patient is brought the operating room, placed on the operating table in the dorsal supine position where general LMA anesthesia was introduced. She was then replaced in the dorsal lithotomy position with pubis was prepped with Betadine solution, and draped in usual fashion. The history was reviewed. The arm was previously marked appropriately.  Review history:  Problems  1. Chronic cystitis (595.2)  2. Gross hematuria (599.71)  3. Pain, flank, bilateral (789.09)  History of Present Illness  53 yo married female, P 2-2-0, returns today for a cystoscopy & to review CT results for hx of gross hematuria. Originally referred by Dr. [name] for further evaluation of gross hematuria, and bilateral flank pain. . She has a hx of recurrent UTI's & was placed on Macrobid post-coital. Her cultures have been negative. She has had ca++ oxalate crystals in her urine, however. U/a review also shows multiple contaminants, including squamous cells.  She has a hx of breast cancer, s/p bilateral mastectomy & bilateral breast implants. She is currently using Vagifem/Tamoxifem. She has significant dyspareunia 2ndary vaginal atrophy.  She has stress incontinence with jumping, and urge incontinence. Her symptoms are not bad enough to warrant investigation yet. she uses no tobacco. Weight: 129, 5'4".  10/28/13 Urine culture - negative  10/07/13 Urine culture - negative    Statement of  Likelihood of Success: Excellent. TIME-OUT  observed.:  Procedure:  Cystourethroscopy was accomplished and showed normal appearing urethra and bladder base. Clear reflux was from the the left renal orifice. The right ureteral orifice was in normal position. However, a 6 open-ended catheter could not be passed into the ureteral orifice because of stenosis. Therefore, a 0.038 guidewire was passed through the ureteral orifice and into the lower ureter. This was under fluoroscopic control. Over this, a 6 [place] open-ended catheter was passed, and right retrograde PolyGram was performed, which showed a 3 mm stone at the right ureterovesical junction with mild hydronephrosis proximal to that. The guidewire was passed to the level of the renal pelvis and coiled. The open-ended catheter was removed. The 6 French flexible tipped rigid ureteroscope was passed into the bladder, and the right ureteral orifice was entered. The ureterovesical junction was quite stenotic, and required dilation with the scope. As the ureteroscope was passed through the stenotic area, progressive dilation was accomplished. The stone was identified in the mid ureter, and was noted to be mobile. It was basket extracted without difficulty.  Patient specifically asked for no double-J stent. There was no bleeding noted, it was felt that the patient could tolerate her postoperative course without double-J stent. She is allergic to morphine, and could not have a B. and O. suppository. Number, she was given IV Tylenol, and IV Toradol prior to awakening.  She was awakened, and taken to recovery room in good condition.

## 2013-11-16 ENCOUNTER — Encounter: Payer: Self-pay | Admitting: *Deleted

## 2013-11-17 ENCOUNTER — Encounter (HOSPITAL_COMMUNITY): Payer: Self-pay | Admitting: Urology

## 2013-12-31 ENCOUNTER — Telehealth: Payer: Self-pay | Admitting: *Deleted

## 2013-12-31 NOTE — Telephone Encounter (Signed)
Called pt to r/s f/u with Dr. [name].  Confirmed appt date and time for 02/11/14 at 9:15 with Mendel Ryder.  Pt relate she is not having any problems and no further needs voiced.

## 2014-02-03 ENCOUNTER — Other Ambulatory Visit: Payer: Self-pay | Admitting: *Deleted

## 2014-02-03 ENCOUNTER — Other Ambulatory Visit: Payer: Self-pay | Admitting: Oncology

## 2014-02-03 DIAGNOSIS — C50919 Malignant neoplasm of unspecified site of unspecified female breast: Secondary | ICD-10-CM

## 2014-02-04 ENCOUNTER — Other Ambulatory Visit (HOSPITAL_BASED_OUTPATIENT_CLINIC_OR_DEPARTMENT_OTHER): Payer: BC Managed Care – PPO

## 2014-02-04 DIAGNOSIS — C50919 Malignant neoplasm of unspecified site of unspecified female breast: Secondary | ICD-10-CM

## 2014-02-04 LAB — CBC WITH DIFFERENTIAL/PLATELET
BASO%: 0.2 % (ref 0.0–2.0)
Basophils Absolute: 0 10*3/uL (ref 0.0–0.1)
EOS ABS: 0.1 10*3/uL (ref 0.0–0.5)
EOS%: 1.4 % (ref 0.0–7.0)
HCT: 40.1 % (ref 34.8–46.6)
HGB: 13.5 g/dL (ref 11.6–15.9)
LYMPH%: 46.7 % (ref 14.0–49.7)
MCH: 32 pg (ref 25.1–34.0)
MCHC: 33.7 g/dL (ref 31.5–36.0)
MCV: 95 fL (ref 79.5–101.0)
MONO#: 0.4 10*3/uL (ref 0.1–0.9)
MONO%: 9.9 % (ref 0.0–14.0)
NEUT%: 41.8 % (ref 38.4–76.8)
NEUTROS ABS: 1.7 10*3/uL (ref 1.5–6.5)
Platelets: 208 10*3/uL (ref 145–400)
RBC: 4.22 10*6/uL (ref 3.70–5.45)
RDW: 13.5 % (ref 11.2–14.5)
WBC: 4.2 10*3/uL (ref 3.9–10.3)
lymph#: 1.9 10*3/uL (ref 0.9–3.3)

## 2014-02-04 LAB — COMPREHENSIVE METABOLIC PANEL (CC13)
ALK PHOS: 58 U/L (ref 40–150)
ALT: 15 U/L (ref 0–55)
AST: 23 U/L (ref 5–34)
Albumin: 3.9 g/dL (ref 3.5–5.0)
Anion Gap: 6 mEq/L (ref 3–11)
BUN: 16.5 mg/dL (ref 7.0–26.0)
CO2: 28 mEq/L (ref 22–29)
Calcium: 10.7 mg/dL — ABNORMAL HIGH (ref 8.4–10.4)
Chloride: 107 mEq/L (ref 98–109)
Creatinine: 0.8 mg/dL (ref 0.6–1.1)
GLUCOSE: 97 mg/dL (ref 70–140)
POTASSIUM: 4.5 meq/L (ref 3.5–5.1)
Sodium: 141 mEq/L (ref 136–145)
TOTAL PROTEIN: 7 g/dL (ref 6.4–8.3)
Total Bilirubin: 0.31 mg/dL (ref 0.20–1.20)

## 2014-02-11 ENCOUNTER — Ambulatory Visit: Payer: BC Managed Care – PPO | Admitting: Oncology

## 2014-02-11 ENCOUNTER — Ambulatory Visit (HOSPITAL_BASED_OUTPATIENT_CLINIC_OR_DEPARTMENT_OTHER): Payer: BC Managed Care – PPO | Admitting: Adult Health

## 2014-02-11 ENCOUNTER — Encounter: Payer: Self-pay | Admitting: Adult Health

## 2014-02-11 VITALS — BP 99/62 | HR 65 | Temp 98.1°F | Resp 18 | Ht 63.0 in | Wt 129.7 lb

## 2014-02-11 DIAGNOSIS — C50919 Malignant neoplasm of unspecified site of unspecified female breast: Secondary | ICD-10-CM

## 2014-02-11 DIAGNOSIS — Z79810 Long term (current) use of selective estrogen receptor modulators (SERMs): Secondary | ICD-10-CM

## 2014-02-11 DIAGNOSIS — IMO0002 Reserved for concepts with insufficient information to code with codable children: Secondary | ICD-10-CM

## 2014-02-11 DIAGNOSIS — Z170 Estrogen receptor positive status [ER+]: Secondary | ICD-10-CM

## 2014-02-11 NOTE — Progress Notes (Signed)
ID: Kristen Gamble   DOB: [birth year]  MR#: [id number]  IFO#:[id number]  PCP: Vikki Ports, MD GYN:Tim Fontaine SU:  OTHER MD: Leta Baptist   HISTORY OF PRESENT ILLNESS:  I do not have records before 10/2006, but in that month the patient underwent bilateral mastectomy for left sided breast cancer, which proved to be an invasive ductal carcinoma, grade 1, measuring 6 mm, in the setting of DCIS (grade 2).  One of the margins was positive focally.  The tumor was ER and PR more than 95% positive, Hercept test negative (at 0/1), and she had an Oncotype recurrence score of 13 predicting a distant risk of recurrence of 8% if she took tamoxifen for 5 years.  In 11/2006 the patient underwent a second excision to clear the margins.  There was a 5 mm residual focus of tumor.  Left axillary lymph node sampling showed a negative sentinel lymph node.  Margins were negative, although close (margins for DCIS were less than 1 mm, and for the invasive tumor less than 2 mm, both deep).  She had lateral subpectoral implants placed, and has been on Femara with fairly significant side effects that are a major source of concern to the patient.  In addition, the patient underwent TAH/BSO 03/2007 with benign pathology.  She has also had multiple other breast biopsies (12/2006, two right sided benign breast lesions; 08/2007, removal of a dermatofibroma; 12/2007, removal of a 2 mm left breast mass which was benign, and 04/2008 a left breast biopsy which proved to be scar).  The patient has had extensive studies including CT of chest, abdomen, and pelvis 12/2007, which was negative.  She had a bone scan in 05/2007 which was negative. Her subsequent history is as detailed below.  INTERVAL HISTORY: [name] returns for followup of her breast cancer. At her last appointment she was c/o TMJ and headaches and was informed that she had a torn meniscus in her jaw.  She is managing the symptoms.  She is taking Tamoxifen 71m daily as prescribed by Dr.  [name]  She is improved in regards to her vaginal dryness.  She is using Vagifem twice per week and Replens once per week.  The dryness is better.  Her frequent UTIs were partly caused by kidney stones and she did have surgery in February, and is feeling much improved.  She also takes Macrobid prior to intercourse and these are much improved.  She does endorse hot flashes and joint aches with the Tamoxifen.  She continues to have chronic lower back pain.  She denies fevers, chills, night sweats, nausea, vomiting, constipation, diarrhea, new headaches, pain, or any further concerns.    REVIEW OF SYSTEMS: A 10 point review of systems was conducted and is otherwise negative except for what is noted above.     PAST MEDICAL HISTORY: Past Medical History  Diagnosis Date  . Breast cancer 10/2006    left  . Osteopenia 05/2013    T score minus 1.7  . ADHD (attention deficit hyperactivity disorder) 08/2011  . TMJ (temporomandibular joint disorder)   . Arthritis   . Complication of anesthesia     nausea  . Numbness     under l arm  . History of kidney stones   . Hematuria   . Nephrolithiasis     and stone distal R ureter on CT and R hydronephrosis    PAST SURGICAL HISTORY: Past Surgical History  Procedure Laterality Date  . Breast surgery  2008    Lumpectomy-Bilateral  mastectomy-implants  . Augmentation mammaplasty  1999  . Total abdominal hysterectomy w/ bilateral salpingoophorectomy  9/08  . Tonsillectomy  1971  . Breast biopsy      x4 in 2009-2012, all benign  . Pelvic laparoscopy      LAP HYST BSO  . Laparoscopic total hysterectomy    . Gynecologic cryosurgery  age 64  . Foot surgery  2014    Right big toe  . Colposcopy    . Cystoscopy    . Cystoscopy/retrograde/ureteroscopy/stone extraction with basket Right 11/13/2013    Procedure: CYSTOSCOPY, RIGHT RETROGRADE PYLOGRAM WITH INTERPRETATION, RIGHT URETEROSCOPY WITH DILATION OF URETERAL STRICTURE WITH BASKET EXTRACTION OF STONE;   Surgeon: Ailene Rud, MD;  Location: [facility];  Service: Urology;  Laterality: Right;    FAMILY HISTORY Family History  Problem Relation Age of Onset  . Diabetes Father   . Cancer Father     stomach  . Alcohol abuse Father   . Depression Father   . Ulcers Father   . Diabetes Maternal Grandmother   . Hypertension Maternal Grandmother   . Heart attack Maternal Grandmother   . Heart disease Maternal Grandmother   . Hypothyroidism Maternal Grandmother   . Hypertension Maternal Grandfather   . Stroke Maternal Grandfather   . Asthma Maternal Grandfather   . Colon polyps Maternal Grandfather   . Heart attack Paternal Grandfather   . Heart disease Paternal Grandfather 33    died of MI  . Arthritis Mother   . Hypothyroidism Mother   . Diabetes Brother   The patient's father died at the age of 66 in the setting of alcoholism.  The patient's mother is alive. There is a brother in good health.  There is no other history of breast cancer or ovarian cancer in the family.  GYNECOLOGIC HISTORY: She is GX, P2.  First pregnancy to term age 7.  She has had significant problems with hot flashes and vaginal dryness since she had her TAH/BSO in September of 2008.  SOCIAL HISTORY: Pansie is very active in our [name]. 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  Her business is called [name].  Their surviving child Kristen Gamble, is heading for college.  Their second child, [name], died at the age of 2.5 from a viral myocarditis.  Kristen Gamble has a son from a prior marriage, also called Kristen Gamble, and he and the patient have adopted a pair of twins.  The patient attends [employer] DIRECTIVES: in place  HEALTH MAINTENANCE: History  Substance Use Topics  . Smoking status: Never Smoker   . Smokeless tobacco: Never Used  . Alcohol Use: 2.5 oz/week    5 drink(s) per week     Comment: 1 glass of wine 4-5 times week.     Colonoscopy:  2015, f/u recommended in 3 years  PAP: s/p TAH/BSO  Bone density: 06/05/2013, osteopenia  Lipid panel: 2013  Allergies  Allergen Reactions  . Morphine     REACTION: itching    Current Outpatient Prescriptions  Medication Sig Dispense Refill  . Estradiol (VAGIFEM) 10 MCG TABS vaginal tablet Place 1 tablet (10 mcg total) vaginally 2 (two) times a week.  8 tablet  11  . fexofenadine (ALLEGRA) 180 MG tablet Take 180 mg by mouth daily.      . fluticasone (FLONASE) 50 MCG/ACT nasal spray Place 2 sprays into the nose daily.  16 g  8  . magnesium oxide (MAG-OX)  400 MG tablet Take 400 mg by mouth daily.       . Multiple Vitamins-Calcium (VIACTIV MULTI-VITAMIN) CHEW Chew 2 each by mouth daily.      . Nitrofurantoin Monohyd Macro (MACROBID PO) Take 50 mg by mouth daily as needed (takes only prior to intercourse).      . tamoxifen (NOLVADEX) 10 MG tablet take 1 tablet daily      . acidophilus (RISAQUAD) CAPS capsule Take 1 capsule by mouth daily.       No current facility-administered medications for this visit.    OBJECTIVE: Middle-aged white woman in no acute distress, pupils equal round and reactive to light Filed Vitals:   02/11/14 0904  BP: 99/62  Pulse: 65  Temp: 98.1 F (36.7 C)  Resp: 18     Body mass index is 22.98 kg/(m^2).    ECOG FS: 0  Sclerae unicteric Oropharynx clear No cervical or supraclavicular adenopathy Lungs no rales or rhonchi Heart regular rate and rhythm Abd soft, nontender, positive bowel sounds MSK no focal spinal tenderness to palpation, specifically over the cervical and lumbosacral spine Neuro: nonfocal, well oriented, stressed affect Breasts: Status post bilateral mastectomies with implant reconstruction. There is no evidence of local recurrence.  LAB RESULTS: Lab Results  Component Value Date   WBC 4.2 02/04/2014   NEUTROABS 1.7 02/04/2014   HGB 13.5 02/04/2014   HCT 40.1 02/04/2014   MCV 95.0 02/04/2014   PLT 208 02/04/2014      Chemistry       Component Value Date/Time   NA 141 02/04/2014 0959   NA 141 11/12/2013 0840   K 4.5 02/04/2014 0959   K 4.4 11/12/2013 0840   CL 101 11/12/2013 0840   CO2 28 02/04/2014 0959   CO2 29 11/12/2013 0840   BUN 16.5 02/04/2014 0959   BUN 17 11/12/2013 0840   CREATININE 0.8 02/04/2014 0959   CREATININE 0.65 11/12/2013 0840   CREATININE 0.95 11/06/2012 1406      Component Value Date/Time   CALCIUM 10.7* 02/04/2014 0959   CALCIUM 11.0* 11/12/2013 0840   ALKPHOS 58 02/04/2014 0959   ALKPHOS 57 11/06/2012 1406   AST 23 02/04/2014 0959   AST 21 11/06/2012 1406   ALT 15 02/04/2014 0959   ALT 18 11/06/2012 1406   BILITOT 0.31 02/04/2014 0959   BILITOT 0.3 11/06/2012 1406       Lab Results  Component Value Date   LABCA2 27 02/04/2012    No components found with this basename: [id number]    No results found for this basename: INR,  in the last 168 hours  Urinalysis    Component Value Date/Time   COLORURINE YELLOW 10/28/2013 1010   APPEARANCEUR CLOUDY* 10/28/2013 1010   LABSPEC 1.015 10/28/2013 1010   PHURINE 7.5 10/28/2013 1010   GLUCOSEU NEG 10/28/2013 1010   HGBUR LARGE* 10/28/2013 1010   BILIRUBINUR NEG 10/28/2013 1010   BILIRUBINUR neg 11/28/2012 1444   KETONESUR NEG 10/28/2013 1010   PROTEINUR NEG 10/28/2013 1010   PROTEINUR neg 11/28/2012 1444   UROBILINOGEN 0.2 10/28/2013 1010   UROBILINOGEN negative 11/28/2012 1444   NITRITE NEG 10/28/2013 1010   NITRITE positiv 11/28/2012 1444   LEUKOCYTESUR NEG 10/28/2013 1010    STUDIES: DEXA scan July 2012 showed osteopenia with a T score of -1.7  ASSESSMENT: 53 y.o. [place] woman status post bilateral mastectomies with subpectoral implant placement July 2008 for a T1c N0, grade 1 invasive ductal carcinoma, strongly estrogen and progesterone receptor positive, HER2/neu  negative, Oncotype DX recurrence score of 13, predicting an 8% risk of recurrence after 5 years of tamoxifen.   She started antiestrogens September 2008, initially tamoxifen, then Femara with  Lupron, switched to tamoxifen again November 2009, stopped tamoxifen at her own discretion December 2011, resumed July 2012.  PLAN:  Aziya is doing well today.  Her lab work is stable.  I reviewed it with her in detail.  She has no sign of recurrence.  She is taking Tamoxifen 30m daily as she states she was instructed by Dr. [name]  Her vaginal dryness and dyspareunia is improved by using Vagifem twice per week, and Replens once per week, however it is not back to normal. I recommended that she try to go to Vagifem once per week and Replens twice per week.  She is in agreement with this.    We reviewed survivorship and she will continue healthy diet, exercise, and monthly breast exams.    DKariellewill return in one year for labs and evaluation by Dr. [name]    I spent 25 minutes counseling the patient face to face.  The total time spent in the appointment was 30 minutes.  LMinette Headland [id number]/31/2015

## 2014-02-12 ENCOUNTER — Telehealth: Payer: Self-pay | Admitting: Oncology

## 2014-02-12 NOTE — Telephone Encounter (Signed)
phone just kept beeping...mailed pt appt sched/avs and letter

## 2014-05-17 ENCOUNTER — Encounter: Payer: Self-pay | Admitting: Adult Health

## 2014-05-26 ENCOUNTER — Encounter: Payer: Self-pay | Admitting: Family Medicine

## 2014-05-26 ENCOUNTER — Ambulatory Visit (INDEPENDENT_AMBULATORY_CARE_PROVIDER_SITE_OTHER): Payer: BC Managed Care – PPO | Admitting: Family Medicine

## 2014-05-26 VITALS — BP 110/70 | HR 60 | Temp 98.4°F | Ht 63.5 in | Wt 128.0 lb

## 2014-05-26 DIAGNOSIS — J069 Acute upper respiratory infection, unspecified: Secondary | ICD-10-CM

## 2014-05-26 DIAGNOSIS — J029 Acute pharyngitis, unspecified: Secondary | ICD-10-CM

## 2014-05-26 DIAGNOSIS — J019 Acute sinusitis, unspecified: Secondary | ICD-10-CM

## 2014-05-26 LAB — POCT RAPID STREP A (OFFICE): Rapid Strep A Screen: NEGATIVE

## 2014-05-26 MED ORDER — AMOXICILLIN 500 MG PO TABS: 1000.0000 mg | ORAL_TABLET | Freq: Two times a day (BID) | ORAL | Status: DC

## 2014-05-26 NOTE — Progress Notes (Signed)
Chief Complaint  Patient presents with  . Laryngitis    started about 2 weeks ago with a what she thought was a cold. Just yesterday she lost her voice and her throat is sore. Started feeling overall worse this past Saturday. Patient would like to be tested for strep.    After returning from a trip to [place] 2 weeks ago she got a cold--sinus pressure, runny nose, slight sore throat.  She used Dayquil and Nyquil, got better by the end of last week.  3 days ago she started feeling worse again--cough, sore throat got worse, and she lost her voice yesterday.  Denies fevers, has had some chills.  She is having yellow-green nasal mucus, as well as discolored phlegm.  She is having pain between her eyes.  Denies shortness of breath or wheezing.  Has some pain in her chest with coughing.  She has been using Mucinex severe cold at night, and Dayquil during the day.  It helps a little (decreases head pressure).  PMH, [place], SH reviewed Outpatient Encounter Prescriptions as of 05/26/2014  Medication Sig Note  . acidophilus (RISAQUAD) CAPS capsule Take 1 capsule by mouth daily.   . fexofenadine (ALLEGRA) 180 MG tablet Take 180 mg by mouth daily.   . fluticasone (FLONASE) 50 MCG/ACT nasal spray Place 2 sprays into the nose daily.   . Multiple Vitamins-Calcium (VIACTIV MULTI-VITAMIN) CHEW Chew 2 each by mouth daily.   . Pseudoephedrine-APAP-DM (DAYQUIL PO) Take 2 tablets by mouth as needed.   Marland Kitchen amoxicillin (AMOXIL) 500 MG tablet Take 2 tablets (1,000 mg total) by mouth 2 (two) times daily.   . magnesium oxide (MAG-OX) 400 MG tablet Take 400 mg by mouth daily.    . Nitrofurantoin Monohyd Macro (MACROBID PO) Take 50 mg by mouth daily as needed (takes only prior to intercourse).   . [DISCONTINUED] Estradiol (VAGIFEM) 10 MCG TABS vaginal tablet Place 1 tablet (10 mcg total) vaginally 2 (two) times a week. 11/11/2013: Tuesday and thursday  . [DISCONTINUED] tamoxifen (NOLVADEX) 10 MG tablet take 1 tablet daily     (amox prescribed today, not prior to visit)  Allergies  Allergen Reactions  . Morphine     REACTION: itching   ROS:  No fever, dizziness, chest pain, palpitations, nausea, vomiting, diarrhea, abdominal pain, rash, bleeding, bruising, urinary complaints, vaginal discharge, shortness of breath.  See HPI  PHYSICAL EXAM: BP 110/70 mmHg  Pulse 60  Temp(Src) 98.4 F (36.9 C) (Tympanic)  Ht 5' 3.5" (1.613 m)  Wt 128 lb (58.06 kg)  BMI 22.32 kg/m2  Well-appearing female with hoarse voice and frequent throat-clearing. HEENT: PERRL, EOMI, conjunctiva clear. Tm's and EAC's normal. Nasal mucosa is only mildly edematous, no purulence. Sinuses are nontender.  OP is clear without significant erythema. Neck: no lymphadenopathy or mass Heart: regular rate and rhythm Lungs: clear bilaterally Psych: normal mood, affect Neuro: alert and oriented. Normal cranial nerves, strength, gait.  Rapid strep negative   ASSESSMENT/PLAN:  Sore throat - Plan: Rapid Strep A  Acute upper respiratory infection  Acute sinusitis, recurrence not specified, unspecified location - Plan: amoxicillin (AMOXIL) 500 MG tablet   URI (second) vs secondary bacterial infection/sinusitis. Continue supportive measures  Drink plenty of fluids. Continue decongestants (ie dayquil (during day) and decongestant in your mucinex at night) Make sure to get guaifenesin (expectorant) during the day as well--so add plain Mucinex during the day (and use night-time one at night). Continue sinus rinses as needed.  Start the antibiotic in 1-2 days if  you are having persistent or worsening symptoms (discolored mucus, sinus pain/pressure).

## 2014-05-26 NOTE — Patient Instructions (Signed)
It is hard to say whether you just got a second virus, vs secondary bacterial infection/sinusitis. Continue supportive measures:  Drink plenty of fluids. Continue decongestants (ie dayquil and decongestant in your mucinex) Make sure to get guaifenesin (expectorant) during the day as well--so add plain Mucinex during the day (and use night-time one at night). Continue sinus rinses as needed.  Start the antibiotic in 1-2 days if you are having persistent or worsening symptoms (discolored mucus, sinus pain/pressure).

## 2014-05-31 ENCOUNTER — Telehealth: Payer: Self-pay | Admitting: Family Medicine

## 2014-05-31 MED ORDER — HYDROCOD POLST-CHLORPHEN POLST 10-8 MG/5ML PO LQCR: 5.0000 mL | Freq: Two times a day (BID) | ORAL | Status: DC

## 2014-05-31 MED ORDER — FLUCONAZOLE 150 MG PO TABS: 150.0000 mg | ORAL_TABLET | Freq: Once | ORAL | Status: DC

## 2014-05-31 NOTE — Telephone Encounter (Signed)
Pt called and stated that she saw you last week. She states that she is still coughing a lot. She states that it is keeping her up at night. Pt is requesting something to help with cough. Pt uses [facility] on [address].

## 2014-05-31 NOTE — Telephone Encounter (Signed)
Please call pt and see how she is feeling otherwise--did she start ABX?  She can have tussionex syrup, 5cc QHS.  This has hydrocodone, and lasts up to 12 hours, so use caution with driving in the morning, take earlier in the evening than right at bedtime.  Disp #75cc, no refill.  This needs to be printed and picked up by patient.  All hydrocodone containing syrups need to be written now (but are usually better tolerated than codeine-containing syrups).  Please see what her preference is, and print rx for me to sign if wants the tussionex

## 2014-05-31 NOTE — Telephone Encounter (Signed)
Spoke with patient she did start the abx Thursday. Just feels badly. Cough has worsened since she was in. Can't get any sleep because of the cough. Was agreeable to come by and pick up rx for tussionex and would also like rx for diflucan as she has a yeast infection now from abx. Printed and waiting on signature.

## 2014-08-09 ENCOUNTER — Encounter: Payer: Self-pay | Admitting: Family Medicine

## 2014-08-09 ENCOUNTER — Ambulatory Visit (INDEPENDENT_AMBULATORY_CARE_PROVIDER_SITE_OTHER): Payer: BLUE CROSS/BLUE SHIELD | Admitting: Family Medicine

## 2014-08-09 VITALS — BP 110/62 | HR 60 | Temp 98.3°F | Ht 63.0 in | Wt 128.0 lb

## 2014-08-09 DIAGNOSIS — R3915 Urgency of urination: Secondary | ICD-10-CM

## 2014-08-09 DIAGNOSIS — N3001 Acute cystitis with hematuria: Secondary | ICD-10-CM

## 2014-08-09 LAB — POCT URINALYSIS DIPSTICK
Bilirubin, UA: NEGATIVE
Glucose, UA: NEGATIVE
Ketones, UA: NEGATIVE
Nitrite, UA: NEGATIVE
Protein, UA: NEGATIVE
SPEC GRAV UA: 1.01
Urobilinogen, UA: NEGATIVE
pH, UA: 7

## 2014-08-09 MED ORDER — CIPROFLOXACIN HCL 250 MG PO TABS: 250.0000 mg | ORAL_TABLET | Freq: Two times a day (BID) | ORAL | Status: DC

## 2014-08-09 NOTE — Patient Instructions (Addendum)
  Call in 2 days if symptoms aren't significantly better. Return immediately if fevers, chills, flank pain, vomiting, Drink plenty of fluids. Contact your GYN re: scheduled procedure.  We will call you when your antibiotic is sent to the pharmacy.  I want to check on your last culture results before I decide which antibiotic to prescribe.

## 2014-08-09 NOTE — Progress Notes (Signed)
Chief Complaint  Patient presents with  . Urinary Urgency    woke up at 5am with urinary urgency and then saw blood right after. Took a Uribel this morning for the pain.    Patient presents with complaint of UTI. Last UTI was 12/15, treated with Uribel and Cipro (through [facility]), x 7 days.  She wasn't feeling quite right while in [place] last week (taking her daughter back to school), and took one dose of nitrofurantoin (that she has for prophylactic use).  Symptoms resolved, until today.  This morning she woke up and had pain, urgency, and hematuria.  Slight achiness in back, but doesn't feel like kidney stone pain; discomfort is across the lower back. She is having suprapubic pain.  No fevers, chills.  She reports having 4-5 UTI's in the last 1.5 years.  The last two, at least, were treated with Cipro. She has tolerated Sulfa without problems in the past.  She has been battling a little cold over the last week, improving.  She is scheduled to have Josph Macho treatment for pelvic floor issues--she states this should help with incontinence and UTI's.  Scheduled with Dr. Helane Rima for 1/27.   She still has a kidney stone on the left. She is followed by [facility], but couldn't get in with them today for her acute complaints.  PMH, PSH, SH reviewed. Outpatient Encounter Prescriptions as of 08/09/2014  Medication Sig Note  . acidophilus (RISAQUAD) CAPS capsule Take 1 capsule by mouth daily.   . fexofenadine (ALLEGRA) 180 MG tablet Take 180 mg by mouth daily.   . fluticasone (FLONASE) 50 MCG/ACT nasal spray Place 2 sprays into the nose daily.   Marland Kitchen LINZESS 290 MCG CAPS capsule Take 290 mcg by mouth every other day.  08/09/2014: Received from: External Pharmacy  . Meth-Hyo-M Bl-Na Phos-Ph Sal (URIBEL) 118 MG CAPS Take 1 capsule by mouth 2 (two) times daily as needed.  08/09/2014: Received from: External Pharmacy  . Multiple Vitamins-Calcium (VIACTIV MULTI-VITAMIN) CHEW Chew 2 each by mouth daily.   .  Wheat Dextrin (BENEFIBER) POWD Take 10 mLs by mouth daily.   . ciprofloxacin (CIPRO) 250 MG tablet Take 1 tablet (250 mg total) by mouth 2 (two) times daily.   . Nitrofurantoin Monohyd Macro (MACROBID PO) Take 50 mg by mouth daily as needed (takes only prior to intercourse).   . [DISCONTINUED] amoxicillin (AMOXIL) 500 MG tablet Take 2 tablets (1,000 mg total) by mouth 2 (two) times daily.   . [DISCONTINUED] chlorpheniramine-HYDROcodone (TUSSIONEX) 10-8 MG/5ML LQCR Take 5 mLs by mouth 2 (two) times daily.   . [DISCONTINUED] fluconazole (DIFLUCAN) 150 MG tablet Take 1 tablet (150 mg total) by mouth once.   . [DISCONTINUED] magnesium oxide (MAG-OX) 400 MG tablet Take 400 mg by mouth daily.    . [DISCONTINUED] Pseudoephedrine-APAP-DM (DAYQUIL PO) Take 2 tablets by mouth as needed.    (cipro not started prior to visit; she HAS taken Uribel today)  Allergies  Allergen Reactions  . Morphine     REACTION: itching   ROS:  No fevers, chills, nausea, vomiting, flank pain, rash.  URI symptoms are resolving.  No chest pain, shortness of breath, diarrhea or other complaints except as noted in HPI.  PHYSICAL EXAM: BP 110/62 mmHg  Pulse 60  Temp(Src) 98.3 F (36.8 C) (Oral)  Ht 5\' 3"  (1.6 m)  Wt 128 lb (58.06 kg)  BMI 22.68 kg/m2 Well developed, pleasant female in no distress Back: no CVA tenderness Abdomen: very mild suprapubic tenderness.  No organomegaly or mass; no rebound tenderness or guarding Psych: normal mood, affect, hygiene and grooming Neuro: alert and oriented. Normal strength, gait, cranial nerves grossly intact.  Urine dip: 3+ blood, 2+ leuks  ASSESSMENT/PLAN:  Acute cystitis with hematuria - Plan: Urine culture, ciprofloxacin (CIPRO) 250 MG tablet  Urinary urgency - Plan: POCT Urinalysis Dipstick   Call [facility]--if prior urine culture was sensitive to sulfa, then rx septra DS (since she took Cipro for last UTI). If resistant, then go ahead with cipro again.  Call in 2  days if symptoms aren't significantly better. Return immediately if fevers, chills, flank pain, vomiting, Drink plenty of fluids. Contact your GYN re: scheduled procedure.  Addendum--culture showed e.coli--resistant to TCN, amp, amox/clav and trimeth/sulfa. Sensitive to nitrofurantoin, cipro, levofloxacin, ceftriaxone.  Sent in rx for Cipro (hesitant to use nitrofurantoin given her prophylactic use, and used last week).

## 2014-08-12 LAB — URINE CULTURE: Colony Count: >=100000

## 2014-08-13 ENCOUNTER — Telehealth: Payer: Self-pay | Admitting: Family Medicine

## 2014-08-13 ENCOUNTER — Encounter: Payer: Self-pay | Admitting: Family Medicine

## 2014-08-13 MED ORDER — FLUCONAZOLE 150 MG PO TABS: 150.0000 mg | ORAL_TABLET | Freq: Once | ORAL | Status: DC

## 2014-08-13 NOTE — Telephone Encounter (Signed)
Pt informed

## 2014-08-13 NOTE — Telephone Encounter (Signed)
Pt called and stated that she was given antibiotics and she now has a yeast infection. Pt would like something sent in for that. Pt uses [facility] on [address] and can be reached at [phone].

## 2014-08-13 NOTE — Telephone Encounter (Signed)
Please let pt know that rx was sent

## 2014-11-05 ENCOUNTER — Ambulatory Visit (HOSPITAL_BASED_OUTPATIENT_CLINIC_OR_DEPARTMENT_OTHER): Payer: BLUE CROSS/BLUE SHIELD | Admitting: Anesthesiology

## 2014-11-05 ENCOUNTER — Encounter (HOSPITAL_BASED_OUTPATIENT_CLINIC_OR_DEPARTMENT_OTHER)
Admission: AD | Disposition: A | Discharge status: Home Health | Payer: Self-pay | Source: Ambulatory Visit | Attending: Urology

## 2014-11-05 ENCOUNTER — Encounter (HOSPITAL_BASED_OUTPATIENT_CLINIC_OR_DEPARTMENT_OTHER): Payer: Self-pay

## 2014-11-05 ENCOUNTER — Ambulatory Visit (HOSPITAL_BASED_OUTPATIENT_CLINIC_OR_DEPARTMENT_OTHER)
Admission: AD | Admit: 2014-11-05 | Discharge: 2014-11-05 | Disposition: A | Discharge status: Home Health | Payer: BLUE CROSS/BLUE SHIELD | Source: Ambulatory Visit | Attending: Urology | Admitting: Urology

## 2014-11-05 ENCOUNTER — Other Ambulatory Visit: Payer: Self-pay | Admitting: Urology

## 2014-11-05 DIAGNOSIS — Z886 Allergy status to analgesic agent status: Secondary | ICD-10-CM | POA: Insufficient documentation

## 2014-11-05 DIAGNOSIS — F1099 Alcohol use, unspecified with unspecified alcohol-induced disorder: Secondary | ICD-10-CM | POA: Insufficient documentation

## 2014-11-05 DIAGNOSIS — Z853 Personal history of malignant neoplasm of breast: Secondary | ICD-10-CM | POA: Insufficient documentation

## 2014-11-05 DIAGNOSIS — Z833 Family history of diabetes mellitus: Secondary | ICD-10-CM | POA: Diagnosis not present

## 2014-11-05 DIAGNOSIS — Z9013 Acquired absence of bilateral breasts and nipples: Secondary | ICD-10-CM | POA: Insufficient documentation

## 2014-11-05 DIAGNOSIS — N132 Hydronephrosis with renal and ureteral calculous obstruction: Secondary | ICD-10-CM | POA: Insufficient documentation

## 2014-11-05 DIAGNOSIS — N201 Calculus of ureter: Secondary | ICD-10-CM | POA: Diagnosis present

## 2014-11-05 DIAGNOSIS — M1990 Unspecified osteoarthritis, unspecified site: Secondary | ICD-10-CM | POA: Diagnosis not present

## 2014-11-05 DIAGNOSIS — Z90710 Acquired absence of both cervix and uterus: Secondary | ICD-10-CM | POA: Diagnosis not present

## 2014-11-05 HISTORY — PX: CYSTOSCOPY WITH RETROGRADE PYELOGRAM, URETEROSCOPY AND STENT PLACEMENT: SHX5789

## 2014-11-05 HISTORY — PX: STONE EXTRACTION WITH BASKET: SHX5318

## 2014-11-05 LAB — POCT I-STAT, CHEM 8
BUN: 19 mg/dL (ref 6–23)
CALCIUM ION: 1.41 mmol/L — AB (ref 1.12–1.23)
Chloride: 104 mmol/L (ref 96–112)
Creatinine, Ser: 0.8 mg/dL (ref 0.50–1.10)
Glucose, Bld: 101 mg/dL — ABNORMAL HIGH (ref 70–99)
HCT: 40 % (ref 36.0–46.0)
Hemoglobin: 13.6 g/dL (ref 12.0–15.0)
Potassium: 3.3 mmol/L — ABNORMAL LOW (ref 3.5–5.1)
Sodium: 142 mmol/L (ref 135–145)
TCO2: 25 mmol/L (ref 0–100)

## 2014-11-05 SURGERY — CYSTOURETEROSCOPY, WITH RETROGRADE PYELOGRAM AND STENT INSERTION
Anesthesia: General | Site: Ureter | Laterality: Left

## 2014-11-05 MED ORDER — LIDOCAINE HCL (CARDIAC) 20 MG/ML IV SOLN
INTRAVENOUS | Status: DC | PRN
  Administered 2014-11-05: 50 mg via INTRAVENOUS

## 2014-11-05 MED ORDER — SODIUM CHLORIDE 0.9 % IR SOLN
Status: DC | PRN
  Administered 2014-11-05: 4000 mL

## 2014-11-05 MED ORDER — SCOPOLAMINE 1 MG/3DAYS TD PT72
1.0000 | MEDICATED_PATCH | Freq: Once | TRANSDERMAL | Status: DC
  Administered 2014-11-05: 1.5 mg via TRANSDERMAL
  Filled 2014-11-05: qty 1

## 2014-11-05 MED ORDER — IOHEXOL 350 MG/ML SOLN
INTRAVENOUS | Status: DC | PRN
  Administered 2014-11-05: 10 mL

## 2014-11-05 MED ORDER — PROMETHAZINE HCL 25 MG/ML IJ SOLN
6.2500 mg | INTRAMUSCULAR | Status: DC | PRN
  Filled 2014-11-05: qty 1

## 2014-11-05 MED ORDER — CEFAZOLIN SODIUM-DEXTROSE 2-3 GM-% IV SOLR
INTRAVENOUS | Status: AC
  Filled 2014-11-05: qty 50

## 2014-11-05 MED ORDER — OXYBUTYNIN CHLORIDE 5 MG PO TABS: 5.0000 mg | ORAL_TABLET | Freq: Three times a day (TID) | ORAL | Status: DC

## 2014-11-05 MED ORDER — MEPERIDINE HCL 25 MG/ML IJ SOLN
6.2500 mg | INTRAMUSCULAR | Status: DC | PRN
  Filled 2014-11-05: qty 1

## 2014-11-05 MED ORDER — PROPOFOL 10 MG/ML IV BOLUS
INTRAVENOUS | Status: DC | PRN
  Administered 2014-11-05: 160 mg via INTRAVENOUS
  Administered 2014-11-05: 40 mg via INTRAVENOUS

## 2014-11-05 MED ORDER — DEXAMETHASONE SODIUM PHOSPHATE 4 MG/ML IJ SOLN
INTRAMUSCULAR | Status: DC | PRN
  Administered 2014-11-05: 10 mg via INTRAVENOUS

## 2014-11-05 MED ORDER — ONDANSETRON HCL 4 MG/2ML IJ SOLN
INTRAMUSCULAR | Status: DC | PRN
  Administered 2014-11-05: 4 mg via INTRAVENOUS

## 2014-11-05 MED ORDER — LACTATED RINGERS IV SOLN
INTRAVENOUS | Status: DC
  Filled 2014-11-05: qty 1000

## 2014-11-05 MED ORDER — BELLADONNA ALKALOIDS-OPIUM 16.2-60 MG RE SUPP
RECTAL | Status: AC
  Filled 2014-11-05: qty 1

## 2014-11-05 MED ORDER — FENTANYL CITRATE (PF) 100 MCG/2ML IJ SOLN
INTRAMUSCULAR | Status: AC
Start: 2014-11-05 — End: 2014-11-05
  Filled 2014-11-05: qty 4

## 2014-11-05 MED ORDER — CEFAZOLIN SODIUM-DEXTROSE 2-3 GM-% IV SOLR
2.0000 g | Freq: Once | INTRAVENOUS | Status: AC
  Administered 2014-11-05: 2 g via INTRAVENOUS
  Filled 2014-11-05: qty 50

## 2014-11-05 MED ORDER — FENTANYL CITRATE (PF) 100 MCG/2ML IJ SOLN
INTRAMUSCULAR | Status: DC | PRN
  Administered 2014-11-05: 25 ug via INTRAVENOUS

## 2014-11-05 MED ORDER — MIDAZOLAM HCL 2 MG/2ML IJ SOLN
INTRAMUSCULAR | Status: AC
  Filled 2014-11-05: qty 2

## 2014-11-05 MED ORDER — FENTANYL CITRATE (PF) 100 MCG/2ML IJ SOLN
25.0000 ug | INTRAMUSCULAR | Status: DC | PRN
  Filled 2014-11-05: qty 1

## 2014-11-05 MED ORDER — FENTANYL CITRATE (PF) 100 MCG/2ML IJ SOLN
100.0000 ug | INTRAMUSCULAR | Status: DC | PRN
  Administered 2014-11-05: 100 ug via INTRAVENOUS
  Administered 2014-11-05: 50 ug via INTRAVENOUS
  Filled 2014-11-05: qty 2

## 2014-11-05 MED ORDER — MIDAZOLAM HCL 5 MG/5ML IJ SOLN
INTRAMUSCULAR | Status: DC | PRN
  Administered 2014-11-05: 2 mg via INTRAVENOUS

## 2014-11-05 MED ORDER — LACTATED RINGERS IV SOLN
INTRAVENOUS | Status: DC
  Administered 2014-11-05: 12:00:00 via INTRAVENOUS
  Filled 2014-11-05: qty 1000

## 2014-11-05 MED ORDER — SULFAMETHOXAZOLE-TRIMETHOPRIM 800-160 MG PO TABS: 1.0000 | ORAL_TABLET | Freq: Two times a day (BID) | ORAL | Status: DC

## 2014-11-05 MED ORDER — SCOPOLAMINE 1 MG/3DAYS TD PT72
MEDICATED_PATCH | TRANSDERMAL | Status: AC
  Filled 2014-11-05: qty 1

## 2014-11-05 MED ORDER — FENTANYL CITRATE (PF) 100 MCG/2ML IJ SOLN
INTRAMUSCULAR | Status: AC
  Filled 2014-11-05: qty 2

## 2014-11-05 MED ORDER — PROMETHAZINE HCL 25 MG/ML IJ SOLN
6.2500 mg | Freq: Once | INTRAMUSCULAR | Status: DC
  Filled 2014-11-05: qty 1

## 2014-11-05 MED ORDER — ACETAMINOPHEN 10 MG/ML IV SOLN
1000.0000 mg | Freq: Once | INTRAVENOUS | Status: AC
  Administered 2014-11-05: 1000 mg via INTRAVENOUS
  Filled 2014-11-05: qty 100

## 2014-11-05 SURGICAL SUPPLY — 35 items
ADAPTER CATH URET PLST 4-6FR (CATHETERS) IMPLANT
ADPR CATH URET STRL DISP 4-6FR (CATHETERS)
BAG DRAIN URO-CYSTO SKYTR STRL (DRAIN) ×3 IMPLANT
BAG DRN UROCATH (DRAIN) ×2
BASKET LASER NITINOL 1.9FR (BASKET) IMPLANT
BASKET STNLS GEMINI 4WIRE 3FR (BASKET) IMPLANT
BASKET ZERO TIP NITINOL 2.4FR (BASKET) ×2 IMPLANT
BSKT STON RTRVL 120 1.9FR (BASKET)
BSKT STON RTRVL GEM 120X11 3FR (BASKET)
BSKT STON RTRVL ZERO TP 2.4FR (BASKET) ×2
CANISTER SUCT LVC 12 LTR MEDI- (MISCELLANEOUS) ×2 IMPLANT
CATH INTERMIT  6FR 70CM (CATHETERS) ×3 IMPLANT
CATH URET 5FR 28IN CONE TIP (BALLOONS)
CATH URET 5FR 28IN OPEN ENDED (CATHETERS) IMPLANT
CATH URET 5FR 70CM CONE TIP (BALLOONS) IMPLANT
CLOTH BEACON ORANGE TIMEOUT ST (SAFETY) ×3 IMPLANT
ELECT REM PT RETURN 9FT ADLT (ELECTROSURGICAL)
ELECTRODE REM PT RTRN 9FT ADLT (ELECTROSURGICAL) IMPLANT
GLOVE BIO SURGEON STRL SZ8 (GLOVE) ×3 IMPLANT
GOWN STRL REUS W/ TWL LRG LVL3 (GOWN DISPOSABLE) ×2 IMPLANT
GOWN STRL REUS W/ TWL XL LVL3 (GOWN DISPOSABLE) ×2 IMPLANT
GOWN STRL REUS W/TWL LRG LVL3 (GOWN DISPOSABLE) ×3
GOWN STRL REUS W/TWL XL LVL3 (GOWN DISPOSABLE) ×3
GUIDEWIRE 0.038 PTFE COATED (WIRE) IMPLANT
GUIDEWIRE ANG ZIPWIRE 038X150 (WIRE) IMPLANT
GUIDEWIRE STR DUAL SENSOR (WIRE) ×2 IMPLANT
IV NS IRRIG 3000ML ARTHROMATIC (IV SOLUTION) ×4 IMPLANT
KIT BALLIN UROMAX 15FX10 (LABEL) IMPLANT
KIT BALLN UROMAX 15FX4 (MISCELLANEOUS) IMPLANT
KIT BALLN UROMAX 26 75X4 (MISCELLANEOUS)
PACK CYSTO (CUSTOM PROCEDURE TRAY) ×3 IMPLANT
SET HIGH PRES BAL DIL (LABEL)
SHEATH ACCESS URETERAL 38CM (SHEATH) IMPLANT
STENT URET 6FRX24 CONTOUR (STENTS) ×2 IMPLANT
WATER STERILE IRR 500ML POUR (IV SOLUTION) ×2 IMPLANT

## 2014-11-05 NOTE — Progress Notes (Signed)
Patient pain level at 4. Warm blankets and change in position done.  Patient stated that her pain level was at a level that was within her coping level and felt that she did not need more pain medication level at this time.  She was given her call bell and instructed to call if she changed her mind and needed some pain medication.Marland Kitchen

## 2014-11-05 NOTE — H&P (Signed)
H&P  Chief Complaint: kidney stone  History of Present Illness: Kristen Gamble is a 54 y.o. year old female who presents this time for left ureteroscopy and stone management. She has had significant left flank pain for the past week. CT revealed a 2.7 mm left UVJ stone.She presented to the office today with significant recurrent pain, which hydromorphone did not adequately managed.Because of her significant pain, she presents for ureteroscopic management. Risks and complications as well as alternatives were discussed with the patient and her husband, who desire to proceed.  Past Medical History  Diagnosis Date  . Breast cancer 10/2006    left  . Osteopenia 05/2013    T score minus 1.7  . ADHD (attention deficit hyperactivity disorder) 08/2011  . TMJ (temporomandibular joint disorder)   . Arthritis   . Complication of anesthesia     nausea  . Numbness     under l arm  . History of kidney stones   . Hematuria   . Nephrolithiasis     and stone distal R ureter on CT and R hydronephrosis    Past Surgical History  Procedure Laterality Date  . Breast surgery  2008    Lumpectomy-Bilateral mastectomy-implants  . Augmentation mammaplasty  1999  . Total abdominal hysterectomy w/ bilateral salpingoophorectomy  9/08  . Tonsillectomy  1971  . Breast biopsy      x4 in 2009-2012, all benign  . Pelvic laparoscopy      LAP HYST BSO  . Laparoscopic total hysterectomy    . Gynecologic cryosurgery  age 9  . Foot surgery  2014    Right big toe  . Colposcopy    . Cystoscopy    . Cystoscopy/retrograde/ureteroscopy/stone extraction with basket Right 11/13/2013    Procedure: CYSTOSCOPY, RIGHT RETROGRADE PYLOGRAM WITH INTERPRETATION, RIGHT URETEROSCOPY WITH DILATION OF URETERAL STRICTURE WITH BASKET EXTRACTION OF STONE;  Surgeon: Ailene Rud, MD;  Location: [facility];  Service: Urology;  Laterality: Right;  . Mastectomy Bilateral 01-2007    Home Medications:  Medications Prior to Admission   Medication Sig Dispense Refill  . fexofenadine (ALLEGRA) 180 MG tablet Take 180 mg by mouth daily.    . fluconazole (DIFLUCAN) 150 MG tablet Take 1 tablet (150 mg total) by mouth once. 1 tablet 0  . fluticasone (FLONASE) 50 MCG/ACT nasal spray Place 2 sprays into the nose daily. 16 g 8  . HYDROmorphone (DILAUDID) 2 MG tablet Take by mouth every 4 (four) hours as needed for moderate pain or severe pain. Takes every 5 hours as needed for pain    . Multiple Vitamins-Calcium (VIACTIV MULTI-VITAMIN) CHEW Chew 2 each by mouth daily.    . promethazine (PHENERGAN) 25 MG tablet Take 25 mg by mouth every 6 (six) hours as needed for nausea or vomiting.    . tamsulosin (FLOMAX) 0.4 MG CAPS capsule Take 0.4 mg by mouth at bedtime.    . Wheat Dextrin (BENEFIBER) POWD Take 10 mLs by mouth daily.    Marland Kitchen acidophilus (RISAQUAD) CAPS capsule Take 1 capsule by mouth daily.    . ciprofloxacin (CIPRO) 250 MG tablet Take 1 tablet (250 mg total) by mouth 2 (two) times daily. 14 tablet 0  . LINZESS 290 MCG CAPS capsule Take 290 mcg by mouth every other day.   3  . Meth-Hyo-M Bl-Na Phos-Ph Sal (URIBEL) 118 MG CAPS Take 1 capsule by mouth 2 (two) times daily as needed.   3  . Nitrofurantoin Monohyd Macro (MACROBID PO) Take 50 mg by  mouth daily as needed (takes only prior to intercourse).      Allergies:  Allergies  Allergen Reactions  . Morphine     REACTION: itching    Family History  Problem Relation Age of Onset  . Diabetes Father   . Cancer Father     stomach  . Alcohol abuse Father   . Depression Father   . Ulcers Father   . Diabetes Maternal Grandmother   . Hypertension Maternal Grandmother   . Heart attack Maternal Grandmother   . Heart disease Maternal Grandmother   . Hypothyroidism Maternal Grandmother   . Hypertension Maternal Grandfather   . Stroke Maternal Grandfather   . Asthma Maternal Grandfather   . Colon polyps Maternal Grandfather   . Heart attack Paternal Grandfather   . Heart  disease Paternal Grandfather 88    died of MI  . Arthritis Mother   . Hypothyroidism Mother   . Diabetes Brother     Social History:  reports that she has never smoked. She has never used smokeless tobacco. She reports that she drinks about 2.5 oz of alcohol per week. She reports that she does not use illicit drugs.  ROS: A complete review of systems was performed.  All systems are negative except for pertinent findings as noted.  Physical Exam:  Vital signs in last 24 hours: Temp:  [98.7 F (37.1 C)] 98.7 F (37.1 C) (04/22 1121) Pulse Rate:  [81] 81 (04/22 1121) Resp:  [12] 12 (04/22 1121) BP: (105)/(65) 105/65 mmHg (04/22 1121) SpO2:  [100 %] 100 % (04/22 1121) Weight:  [128 lb (58.06 kg)] 128 lb (58.06 kg) (04/22 1121) General:  Alert and oriented,Insignificant distress in the office HEENT: Normocephalic, atraumatic Neck: No JVD or lymphadenopathy Cardiovascular: Regular rate and rhythm Lungs: Clear bilaterally Abdomen: Soft, nontender, nondistended, no abdominal masses Back: No CVA tenderness Extremities: No edema Neurologic: Grossly intact  Laboratory Data:  Results for orders placed or performed during the hospital encounter of 11/05/14 (from the past 24 hour(s))  I-STAT, chem 8     Status: Abnormal   Collection Time: 11/05/14 12:02 PM  Result Value Ref Range   Sodium 142 135 - 145 mmol/L   Potassium 3.3 (L) 3.5 - 5.1 mmol/L   Chloride 104 96 - 112 mmol/L   BUN 19 6 - 23 mg/dL   Creatinine, Ser 0.80 0.50 - 1.10 mg/dL   Glucose, Bld 101 (H) 70 - 99 mg/dL   Calcium, Ion 1.41 (H) 1.12 - 1.23 mmol/L   TCO2 25 0 - 100 mmol/L   Hemoglobin 13.6 12.0 - 15.0 g/dL   HCT 40.0 36.0 - 46.0 %   No results found for this or any previous visit (from the past 240 hour(s)). Creatinine:  Recent Labs  11/05/14 1202  CREATININE 0.80    Radiologic Imaging: No results found.  Impression/Assessment:  Left ureterovesical junction calculus with significant, recurrent  pain  Plan:  Cystoscopy, left retrograde ureteropyelogram, left ureteroscopy with possible holmium laser and extraction of left distal ureteral stone, possible double-J stent placement.  Jorja Loa 11/05/2014, 12:28 PM  Lillette Boxer. Jodel Mayhall MD

## 2014-11-05 NOTE — Op Note (Signed)
PATIENT:  Kristen Gamble  PRE-OPERATIVE DIAGNOSIS:Left distal ureteral stone   POST-OPERATIVE DIAGNOSIS: Same  PROCEDURE: Cystoscopy, left retrograde ureteropyelogram, left ureteroscopy, extraction of left ureteral stone, double-J stent placement, interpretive fluoroscopy  SURGEON:  Lillette Boxer. Shawntez Dickison, M.D.  ANESTHESIA:  General  EBL:  Minimal  DRAINS:    24 cm x 6 French contour stent with stringLOCAL MEDICATIONS USED:  None  SPECIMEN:    INDICATION: Kristen Gamble is a 54 year old female with intractable pain secondary to a small left UVJ stone. She has failed outpatient management. She presented today with significant flank pain, nausea and vomiting. Because of her intractable pain and symptoms, it was recommended that she undergo ureteroscopy and stone extraction. Risks of the procedure as well as alternatives discussed with the patient and her husband who desire to proceed.   Description of procedure: The patient was properly identified and marked (if applicable) in the holding area. They were then  taken to the operating room and placed on the table in a supine position. General anesthesia was then administered. Once fully anesthetized the patient was moved to the dorsolithotomy position and the genitalia and perineum were sterilely prepped and draped in standard fashion. An official timeout was then performed.   A 22 French panendoscope was advanced under direct vision into her bladder. The bladder was inspected circumferentially. There were no tumors trabeculations or foreign bodies. Ureteral orifices were normal in configuration and location with some erythema around the left ureteral orifice. No stone was seen at the orifice. Following circumferential inspection of the bladder, I attempted to pass a 6 [place] open-ended catheter into the left ureteral orifice. This was difficult/impossible. With the addition of a center tip guidewire, I was able to cannulate the distal ureter. Retrograde  ureteropyelogram was performed.  This revealed moderate columnization of contrast, with mild hydronephrosis. Pyelo-calyceal system was dilated as well. No significant filling defects or other abnormalities were seen except for a very distal ureteral filling defect consistent with a stone.  I attempted to pass a rigid ureteroscope through the ureteral orifice. This was difficult due to narrowing of the ureteral orifice. I then removed the ureteroscope and dilated the distal ureter/ureteral orifice with the inner core of a 12/14 ureteral access sheath. E    this was easily dilated. I then removed the ureteral access sheath, and using the guidewire for guidance, passed the ureteroscope up into the ureter. 2 stone fragments were seen in the distal ureter. I then advanced the ureter all the way to the hilum, up to the UPJ. No further stones were seen. I then backed the scope up, and snared the small fragments in the distal ureter and extracted them. Inspection revealed no further stone burden. I removed the ureteroscope. Over top of the guidewire, using a cystoscope, I then passed a 24 cm x 6 French contour stent. The string was left on. Good proximal and distal curls were seen following removal of the guidewire. I then drained the bladder. The scope was removed. The string was taped to the patient's perineum.  She tolerated the procedure well. She was taken to the PACU in stable condition. PLAN OF CARE: Discharge to home after PACU  PATIENT DISPOSITION:  PACU - hemodynamically stable.

## 2014-11-05 NOTE — Discharge Instructions (Signed)
[facility] [phone] Post Ureteroscopy With or Without Stent Instructions  Definitions:  Ureter: The duct that transports urine from the kidney to the bladder. Stent:   A plastic hollow tube that is placed into the ureter, from the kidney to the                 bladder to prevent the ureter from swelling shut.  GENERAL INSTRUCTIONS:  Despite the fact that no skin incisions were used, the area around the ureter and bladder is raw and irritated. The stent is a foreign body which will further irritate the bladder wall. This irritation is manifested by increased frequency of urination, both day and night, and by an increase in the urge to urinate. In some, the urge to urinate is present almost always. Sometimes the urge is strong enough that you may not be able to stop yourself from urinating. The only real cure is to remove the stent and then give time for the bladder wall to heal which can't be done until the danger of the ureter swelling shut has passed, which varies.  Pull the string to remove the stent on Monday morning.  You may see some blood in your urine while the stent is in place and a few days afterwards. Do not be alarmed, even if the urine was clear for a while. Get off your feet and drink lots of fluids until clearing occurs. If you start to pass clots or don't improve, call us.  DIET: You may return to your normal diet immediately. Because of the raw surface of your bladder, alcohol, spicy foods, acid type foods and drinks with caffeine may cause irritation or frequency and should be used in moderation. To keep your urine flowing freely and to avoid constipation, drink plenty of fluids during the day ( 8-10 glasses ). Tip: Avoid cranberry juice because it is very acidic.  ACTIVITY: Your physical activity doesn't need to be restricted. However, if you are very active, you may see some blood in your urine. We suggest that you reduce your activity under these  circumstances until the bleeding has stopped.  BOWELS: It is important to keep your bowels regular during the postoperative period. Straining with bowel movements can cause bleeding. A bowel movement every other day is reasonable. Use a mild laxative if needed, such as Milk of Magnesia 2-3 tablespoons, or 2 Dulcolax tablets. Call if you continue to have problems. If you have been taking narcotics for pain, before, during or after your surgery, you may be constipated. Take a laxative if necessary.   MEDICATION: You should resume your pre-surgery medications unless told not to. In addition you will often be given an antibiotic to prevent infection. These should be taken as prescribed until the bottles are finished unless you are having an unusual reaction to one of the drugs.  PROBLEMS YOU SHOULD REPORT TO [place]:  Fevers over 100.5 Fahrenheit.  Heavy bleeding, or clots ( See above notes about blood in urine ).  Inability to urinate.  Drug reactions ( hives, rash, nausea, vomiting, diarrhea ).  Severe burning or pain with urination that is not improving.  FOLLOW-UP: You will need a follow-up appointment to monitor your progress. Call for this appointment at the number listed above. Usually the first appointment will be about three to fourteen days after your surgery.       Post Anesthesia Home Care Instructions  Activity: Get plenty of rest for the remainder of the day. A responsible  adult should stay with you for 24 hours following the procedure.  For the next 24 hours, DO NOT: -Drive a car -Paediatric nurse -Drink alcoholic beverages -Take any medication unless instructed by your physician -Make any legal decisions or sign important papers.  Meals: Start with liquid foods such as gelatin or soup. Progress to regular foods as tolerated. Avoid greasy, spicy, heavy foods. If nausea and/or vomiting occur, drink only clear liquids until the nausea and/or vomiting subsides. Call your  physician if vomiting continues.  Special Instructions/Symptoms: Your throat may feel dry or sore from the anesthesia or the breathing tube placed in your throat during surgery. If this causes discomfort, gargle with warm salt water. The discomfort should disappear within 24 hours.  If you had a scopolamine patch placed behind your ear for the management of post- operative nausea and/or vomiting:  1. The medication in the patch is effective for 72 hours, after which it should be removed.  Wrap patch in a tissue and discard in the trash. Wash hands thoroughly with soap and water. 2. You may remove the patch earlier than 72 hours if you experience unpleasant side effects which may include dry mouth, dizziness or visual disturbances. 3. Avoid touching the patch. Wash your hands with soap and water after contact with the patch.

## 2014-11-05 NOTE — Anesthesia Preprocedure Evaluation (Signed)
Anesthesia Evaluation  Patient identified by MRN, date of birth, ID band Patient awake    Reviewed: Allergy & Precautions, NPO status , Patient's Chart, lab work & pertinent test results  History of Anesthesia Complications (+) PONV  Airway Mallampati: II  TM Distance: >3 FB Neck ROM: Full    Dental no notable dental hx. (+) Caps   Pulmonary neg pulmonary ROS,  breath sounds clear to auscultation  Pulmonary exam normal       Cardiovascular negative cardio ROS  Rhythm:Regular Rate:Normal     Neuro/Psych negative neurological ROS  negative psych ROS   GI/Hepatic negative GI ROS, Neg liver ROS,   Endo/Other  negative endocrine ROS  Renal/GU negative Renal ROS  negative genitourinary   Musculoskeletal negative musculoskeletal ROS (+)   Abdominal   Peds negative pediatric ROS (+)  Hematology negative hematology ROS (+)   Anesthesia Other Findings   Reproductive/Obstetrics negative OB ROS                             Anesthesia Physical Anesthesia Plan  ASA: II  Anesthesia Plan: General   Post-op Pain Management:    Induction: Intravenous  Airway Management Planned: LMA  Additional Equipment:   Intra-op Plan:   Post-operative Plan: Extubation in OR  Informed Consent: I have reviewed the patients History and Physical, chart, labs and discussed the procedure including the risks, benefits and alternatives for the proposed anesthesia with the patient or authorized representative who has indicated his/her understanding and acceptance.   Dental advisory given  Plan Discussed with: CRNA  Anesthesia Plan Comments:         Anesthesia Quick Evaluation

## 2014-11-05 NOTE — Anesthesia Postprocedure Evaluation (Signed)
  Anesthesia Post-op Note  Patient: Kristen Gamble  Procedure(s) Performed: Procedure(s): CYSTOSCOPY WITH RETROGRADE PYELOGRAM, URETEROSCOPY AND STENT PLACEMENT (Left) STONE EXTRACTION WITH BASKET (Left)  Patient Location: PACU  Anesthesia Type:General  Level of Consciousness: awake and alert   Airway and Oxygen Therapy: Patient Spontanous Breathing  Post-op Pain: none  Post-op Assessment: Post-op Vital signs reviewed, Patient's Cardiovascular Status Stable and Respiratory Function Stable  Post-op Vital Signs: Reviewed  Filed Vitals:   11/05/14 1540  BP:   Pulse: 65  Temp:   Resp: 13    Complications: No apparent anesthesia complications

## 2014-11-05 NOTE — Anesthesia Procedure Notes (Signed)
Procedure Name: LMA Insertion Date/Time: 11/05/2014 2:37 PM Performed by: Bethena Roys T Pre-anesthesia Checklist: Patient identified, Emergency Drugs available, Suction available and Patient being monitored Patient Re-evaluated:Patient Re-evaluated prior to inductionOxygen Delivery Method: Circle System Utilized Preoxygenation: Pre-oxygenation with 100% oxygen Intubation Type: IV induction Ventilation: Mask ventilation without difficulty LMA: LMA inserted LMA Size: 4.0 Number of attempts: 1 Airway Equipment and Method: Bite block Placement Confirmation: positive ETCO2 Dental Injury: Teeth and Oropharynx as per pre-operative assessment

## 2014-11-05 NOTE — Transfer of Care (Signed)
Immediate Anesthesia Transfer of Care Note  Patient: Cleva Camero  Procedure(s) Performed: Procedure(s): CYSTOSCOPY WITH RETROGRADE PYELOGRAM/URETERAL STENT PLACEMENT (Left) URETEROSCOPY (Left) Immediate Anesthesia Transfer of Care Note  Patient: Shannette Tabares  Procedure(s) Performed: Procedure(s) (LRB): CYSTOSCOPY WITH RETROGRADE PYELOGRAM/URETERAL STENT PLACEMENT (Left) URETEROSCOPY (Left)  Patient Location: PACU  Anesthesia Type: General  Level of Consciousness: awake, alert  and oriented  Airway & Oxygen Therapy: Patient Spontanous Breathing and Patient connected to nasal cannula oxygen  Post-op Assessment: Report given to PACU RN and Post -op Vital signs reviewed and stable  Post vital signs: Reviewed and stable  Complications: No apparent anesthesia complications  Last Vitals:  Filed Vitals:   11/05/14 1507  BP: 113/66  Pulse: 79  Temp: 36.6 C  Resp: 17

## 2014-11-08 ENCOUNTER — Encounter (HOSPITAL_BASED_OUTPATIENT_CLINIC_OR_DEPARTMENT_OTHER): Payer: Self-pay | Admitting: Urology

## 2014-11-11 ENCOUNTER — Encounter: Payer: Self-pay | Admitting: Family Medicine

## 2014-11-11 ENCOUNTER — Ambulatory Visit (INDEPENDENT_AMBULATORY_CARE_PROVIDER_SITE_OTHER): Payer: BLUE CROSS/BLUE SHIELD | Admitting: Family Medicine

## 2014-11-11 VITALS — BP 102/62 | HR 68 | Temp 97.9°F | Ht 63.0 in | Wt 124.8 lb

## 2014-11-11 DIAGNOSIS — N201 Calculus of ureter: Secondary | ICD-10-CM | POA: Diagnosis not present

## 2014-11-11 DIAGNOSIS — M2660 Temporomandibular joint disorder, unspecified: Secondary | ICD-10-CM

## 2014-11-11 DIAGNOSIS — K219 Gastro-esophageal reflux disease without esophagitis: Secondary | ICD-10-CM

## 2014-11-11 DIAGNOSIS — R079 Chest pain, unspecified: Secondary | ICD-10-CM | POA: Diagnosis not present

## 2014-11-11 DIAGNOSIS — M26609 Unspecified temporomandibular joint disorder, unspecified side: Secondary | ICD-10-CM

## 2014-11-11 MED ORDER — DEXLANSOPRAZOLE 60 MG PO CPDR: 60.0000 mg | DELAYED_RELEASE_CAPSULE | Freq: Every day | ORAL | Status: DC

## 2014-11-11 NOTE — Patient Instructions (Signed)
Avoid Tums--this might worsen kidney stones (has calcium). Take the Dexilant once daily for 2 weeks. This should calm down any inflammation related to anti-inflammatories and reflux. Follow up with cardiologist next week if your chest pain doesn't resolve.  Gastroesophageal Reflux Disease, Adult Gastroesophageal reflux disease (GERD) happens when acid from your stomach flows up into the esophagus. When acid comes in contact with the esophagus, the acid causes soreness (inflammation) in the esophagus. Over time, GERD may create small holes (ulcers) in the lining of the esophagus. CAUSES   Increased body weight. This puts pressure on the stomach, making acid rise from the stomach into the esophagus.  Smoking. This increases acid production in the stomach.  Drinking alcohol. This causes decreased pressure in the lower esophageal sphincter (valve or ring of muscle between the esophagus and stomach), allowing acid from the stomach into the esophagus.  Late evening meals and a full stomach. This increases pressure and acid production in the stomach.  A malformed lower esophageal sphincter. Sometimes, no cause is found. SYMPTOMS   Burning pain in the lower part of the mid-chest behind the breastbone and in the mid-stomach area. This may occur twice a week or more often.  Trouble swallowing.  Sore throat.  Dry cough.  Asthma-like symptoms including chest tightness, shortness of breath, or wheezing. DIAGNOSIS  Your caregiver may be able to diagnose GERD based on your symptoms. In some cases, X-rays and other tests may be done to check for complications or to check the condition of your stomach and esophagus. TREATMENT  Your caregiver may recommend over-the-counter or prescription medicines to help decrease acid production. Ask your caregiver before starting or adding any new medicines.  HOME CARE INSTRUCTIONS   Change the factors that you can control. Ask your caregiver for guidance  concerning weight loss, quitting smoking, and alcohol consumption.  Avoid foods and drinks that make your symptoms worse, such as:  Caffeine or alcoholic drinks.  Chocolate.  Peppermint or mint flavorings.  Garlic and onions.  Spicy foods.  Citrus fruits, such as oranges, lemons, or limes.  Tomato-based foods such as sauce, chili, salsa, and pizza.  Fried and fatty foods.  Avoid lying down for the 3 hours prior to your bedtime or prior to taking a nap.  Eat small, frequent meals instead of large meals.  Wear loose-fitting clothing. Do not wear anything tight around your waist that causes pressure on your stomach.  Raise the head of your bed 6 to 8 inches with wood blocks to help you sleep. Extra pillows will not help.  Only take over-the-counter or prescription medicines for pain, discomfort, or fever as directed by your caregiver.  Do not take aspirin, ibuprofen, or other nonsteroidal anti-inflammatory drugs (NSAIDs). SEEK IMMEDIATE MEDICAL CARE IF:   You have pain in your arms, neck, jaw, teeth, or back.  Your pain increases or changes in intensity or duration.  You develop nausea, vomiting, or sweating (diaphoresis).  You develop shortness of breath, or you faint.  Your vomit is green, yellow, black, or looks like coffee grounds or blood.  Your stool is red, bloody, or black. These symptoms could be signs of other problems, such as heart disease, gastric bleeding, or esophageal bleeding. MAKE SURE YOU:   Understand these instructions.  Will watch your condition.  Will get help right away if you are not doing well or get worse. Document Released: 04/11/2005 Document Revised: 09/24/2011 Document Reviewed: 01/19/2011 [facility] Patient Information 2015 [place], [place]. This information is not intended  to replace advice given to you by your health care provider. Make sure you discuss any questions you have with your health care provider.  

## 2014-11-11 NOTE — Progress Notes (Signed)
Chief Complaint  Patient presents with  . Chest Pain    just feels badly, no energy. Chest pressure, feels like she has reflux.     She is complaining of a lot of heartburn for the last couple of months.  She has also had some pain centrally in her chest, and sometimes in her left breast area/chest. She feels "exhausted".  She scheduled appt with cardiologist next week (Dr. Radford Pax), but felt so crummy today that she called here to be seen sooner. She saw Dr. Verl Blalock  2-3 years ago (Dr. [name] sent her) when she was having chest pain intermittently.  She had EKG and echo--review of chart shows it was actually in 2011.  Everything was reportedly okay at that time.    She has been having problems with heartburn off and on for a couple of months.  She hasn't taken anything for reflux other than Tums as needed.  Had been taking Tums for 2-3 days prior to last episode of kidney stones. She stopped caffeine 2-3 weeks ago. She takes Aleve as needed for TMJ flare, once or twice daily; last flare was 1-2 weeks ago.  Pain in chest is worse after eating or drinking, even just a sip of water.  Had cystoscopy Friday to remove a kidney stone (left side).  She still has some urethral spasm, but is feeling much better.  Using uribel prn. Took aleve last night for some pain.  PMH, PSH, SH reviewed.  Outpatient Encounter Prescriptions as of 11/11/2014  Medication Sig Note  . LINZESS 290 MCG CAPS capsule Take 290 mcg by mouth every other day.  08/09/2014: Received from: External Pharmacy  . Meth-Hyo-M Bl-Na Phos-Ph Sal (URIBEL) 118 MG CAPS Take 1 capsule by mouth 2 (two) times daily as needed.  08/09/2014: Received from: External Pharmacy  . Wheat Dextrin (BENEFIBER) POWD Take 10 mLs by mouth daily.   Marland Kitchen acidophilus (RISAQUAD) CAPS capsule Take 1 capsule by mouth daily.   . fexofenadine (ALLEGRA) 180 MG tablet Take 180 mg by mouth daily.   . fluticasone (FLONASE) 50 MCG/ACT nasal spray Place 2 sprays into the nose  daily. (Patient not taking: Reported on 11/11/2014)   . Multiple Vitamins-Calcium (VIACTIV MULTI-VITAMIN) CHEW Chew 2 each by mouth daily.   . Nitrofurantoin Monohyd Macro (MACROBID PO) Take 50 mg by mouth daily as needed (takes only prior to intercourse).   . [DISCONTINUED] HYDROmorphone (DILAUDID) 2 MG tablet Take by mouth every 4 (four) hours as needed for moderate pain or severe pain. Takes every 5 hours as needed for pain   . [DISCONTINUED] oxybutynin (DITROPAN) 5 MG tablet Take 1 tablet (5 mg total) by mouth 3 (three) times daily.   . [DISCONTINUED] promethazine (PHENERGAN) 25 MG tablet Take 25 mg by mouth every 6 (six) hours as needed for nausea or vomiting.   . [DISCONTINUED] sulfamethoxazole-trimethoprim (BACTRIM DS,SEPTRA DS) 800-160 MG per tablet Take 1 tablet by mouth 2 (two) times daily.    (not currently needing allergy medications)  Allergies  Allergen Reactions  . Morphine     REACTION: itching   ROS:  No fevers, chills, URI symptoms, cough, shortness of breath.  Denies exertional chest pain (limited exertion in the last 2 weeks; no problems when active prior to this recent kidney stone). No nausea, vomiting, diarrhea, bleeding, bruising, rash or other complaints except as noted in HPI.  PHYSICAL EXAM: BP 102/62 mmHg  Pulse 68  Temp(Src) 97.9 F (36.6 C) (Tympanic)  Ht 5\' 3"  (1.6 m)  Wt 124 lb 12.8 oz (56.609 kg)  BMI 22.11 kg/m2 Well developed, well-appearing female in no distress HEENT: PERRL, EOMI, conjunctiva clear.  OP clear. Neck:  No lymphadenopathy, thyromegaly or mass Heart: regular rate and rhythm without murmur Lungs: clear bilaterally Abdomen: normal bowel sounds, soft.  Mildly tender in epigastrium. No organomegaly or mass, no rebound or guarding Chest wall nontender Extremities: no edema Skin: no rash Neuro: alert and oriented. Normal cranial nerves, strength, gait Psych: normal mood, affect, hygiene and grooming  EKG:  NSR, first degree AVB.  No  acute changes, no significant change from prior EKG   ASSESSMENT/PLAN  Chest pain, unspecified chest pain type - EKG unchanged; recent flare of reflux with NSAID use as well.  Dexilant samples given; avoid NSAIDs, diet reviewed.  f/u with cardiology if not improving - Plan: EKG 12-Lead  Gastroesophageal reflux disease, esophagitis presence not specified - Plan: dexlansoprazole (DEXILANT) 60 MG capsule  TMJ (temporomandibular joint disorder) - try and avoid NSAID use; if flare in future, use OTC PPI along with NSAID  Ureteral calculus - s/p surgery last week; avoid Tums use.  Discussed other treatments for reflux instead

## 2014-11-12 ENCOUNTER — Ambulatory Visit (INDEPENDENT_AMBULATORY_CARE_PROVIDER_SITE_OTHER): Payer: BLUE CROSS/BLUE SHIELD | Admitting: Internal Medicine

## 2014-11-12 DIAGNOSIS — Z9189 Other specified personal risk factors, not elsewhere classified: Secondary | ICD-10-CM | POA: Diagnosis not present

## 2014-11-12 DIAGNOSIS — Z23 Encounter for immunization: Secondary | ICD-10-CM | POA: Diagnosis not present

## 2014-11-12 MED ORDER — TYPHOID VACCINE PO CPDR: 1.0000 | DELAYED_RELEASE_CAPSULE | ORAL | Status: DC

## 2014-11-12 MED ORDER — CIPROFLOXACIN HCL 500 MG PO TABS: 500.0000 mg | ORAL_TABLET | Freq: Two times a day (BID) | ORAL | Status: DC

## 2014-11-12 NOTE — Progress Notes (Signed)
   Subjective:    Patient ID: Kristen Gamble, female    DOB: Oct 22, 1960, 54 y.o.   MRN: [id number]  HPI 54yo F who is going to [place], to go to [place], [place] for a few days, then onto [employer]. Traveling from June 8 thru 18th.  She had recent kidney stone removal procedure, still recovering.  Prior travel includes [place] in 2015, [place] in 2015, and [place] in 2010  Prior vax: tdap in 2013 Allergies  Allergen Reactions  . Morphine     REACTION: itching   Current Outpatient Prescriptions on File Prior to Visit  Medication Sig Dispense Refill  . acidophilus (RISAQUAD) CAPS capsule Take 1 capsule by mouth daily.    Marland Kitchen dexlansoprazole (DEXILANT) 60 MG capsule Take 1 capsule (60 mg total) by mouth daily. 15 capsule 0  . fexofenadine (ALLEGRA) 180 MG tablet Take 180 mg by mouth daily.    . fluticasone (FLONASE) 50 MCG/ACT nasal spray Place 2 sprays into the nose daily. (Patient not taking: Reported on 11/11/2014) 16 g 8  . LINZESS 290 MCG CAPS capsule Take 290 mcg by mouth every other day.   3  . Meth-Hyo-M Bl-Na Phos-Ph Sal (URIBEL) 118 MG CAPS Take 1 capsule by mouth 2 (two) times daily as needed.   3  . Multiple Vitamins-Calcium (VIACTIV MULTI-VITAMIN) CHEW Chew 2 each by mouth daily.    . Nitrofurantoin Monohyd Macro (MACROBID PO) Take 50 mg by mouth daily as needed (takes only prior to intercourse).    . Wheat Dextrin (BENEFIBER) POWD Take 10 mLs by mouth daily.     No current facility-administered medications on file prior to visit.   Active Ambulatory Problems    Diagnosis Date Noted  . Malignant neoplasm of breast (female), unspecified site 03/10/2010  . AV BLOCK, 1ST DEGREE 03/17/2010  . ARTHRITIS, HAND 03/10/2010  . PALPITATIONS 03/10/2010  . CHEST PAIN-UNSPECIFIED 03/17/2010  . MASTECTOMY, BILATERAL, HX OF 03/10/2010  . Cancer   . Allergic rhinitis, cause unspecified 09/17/2011  . Hot flashes 09/17/2011  . ADHD (attention deficit hyperactivity disorder) 10/22/2011  . TMJ disease  10/22/2011  . TMJ (temporomandibular joint disorder)    Resolved Ambulatory Problems    Diagnosis Date Noted  . No Resolved Ambulatory Problems   Past Medical History  Diagnosis Date  . Breast cancer 10/2006  . Osteopenia 05/2013  . Arthritis   . Complication of anesthesia   . Numbness   . History of kidney stones   . Hematuria   . Nephrolithiasis       Review of Systems     Objective:   Physical Exam        Assessment & Plan:   pre travel vaccination = recommend to get hepatitis a, typhoid vaccine  Traveler's diarrhea = gave rx for cipro  Gave pre travel vaccine advise for mosquito borne illnesses

## 2014-11-16 NOTE — Addendum Note (Signed)
Addended by: Landis Gandy on: 11/16/2014 03:56 PM   Modules accepted: Orders

## 2014-11-17 ENCOUNTER — Telehealth: Payer: Self-pay | Admitting: Oncology

## 2014-11-17 NOTE — Telephone Encounter (Signed)
Left message to confirm appointment change for July. Mailed calendar

## 2014-11-17 NOTE — Progress Notes (Signed)
Cardiology Office Note   Date:  11/18/2014   ID:  Kristen Gamble, DOB Feb 20, 1961, MRN [id number]  PCP:  Vikki Ports, MD    Chief Complaint  Patient presents with  . New Evaluation    Chest pain and pressure      History of Present Illness: Kristen Gamble is a 54 y.o. female who presents for evaluation of chest pain.  She recently saw Dr. Tomi Bamberger, complaining of chest pressure that feels like reflux and no energy.  This has been going on for a few months.  She has a history of CP in the past and was seen by Dr. Mar Daring.  Workup was normal in 2011.  She takes TUMS PRN.  Pain is worse after eating and drinking.  She had been using NSAIDs recently as well for a kidney stone and TMJ.  She was told to stop NSAIDs and she was given samples of Dexilant. The Dexilant has significantly improved her pain but still has some residual.  She describes it as a pressure under her left breast but then sometimes pressure in the midsternal area and she would have to sit up.  It is worse at night but not every night.  She denies any SOB, nausea or diaphoresis.      Past Medical History  Diagnosis Date  . Breast cancer 10/2006    left  . Osteopenia 05/2013    T score minus 1.7  . ADHD (attention deficit hyperactivity disorder) 08/2011  . TMJ (temporomandibular joint disorder)   . Arthritis   . Complication of anesthesia     nausea  . Numbness     under l arm  . History of kidney stones   . Hematuria   . Nephrolithiasis     and stone distal R ureter on CT and R hydronephrosis    Past Surgical History  Procedure Laterality Date  . Breast surgery  2008    Lumpectomy-Bilateral mastectomy-implants  . Augmentation mammaplasty  1999  . Total abdominal hysterectomy w/ bilateral salpingoophorectomy  9/08  . Tonsillectomy  1971  . Breast biopsy      x4 in 2009-2012, all benign  . Pelvic laparoscopy      LAP HYST BSO  . Laparoscopic total hysterectomy    . Gynecologic cryosurgery  age 50  . Foot surgery   2014    Right big toe  . Colposcopy    . Cystoscopy    . Cystoscopy/retrograde/ureteroscopy/stone extraction with basket Right 11/13/2013    Procedure: CYSTOSCOPY, RIGHT RETROGRADE PYLOGRAM WITH INTERPRETATION, RIGHT URETEROSCOPY WITH DILATION OF URETERAL STRICTURE WITH BASKET EXTRACTION OF STONE;  Surgeon: Ailene Rud, MD;  Location: [facility];  Service: Urology;  Laterality: Right;  . Mastectomy Bilateral 01-2007  . Cystoscopy with retrograde pyelogram, ureteroscopy and stent placement Left 11/05/2014    Procedure: CYSTOSCOPY WITH RETROGRADE PYELOGRAM, URETEROSCOPY AND STENT PLACEMENT;  Surgeon: Franchot Gallo, MD;  Location[facility];  Service: Urology;  Laterality: Left;  . Stone extraction with basket Left 11/05/2014    Procedure: STONE EXTRACTION WITH BASKET;  Surgeon: Franchot Gallo, MD;  Location: [facility];  Service: Urology;  Laterality: Left;     Current Outpatient Prescriptions  Medication Sig Dispense Refill  . ciprofloxacin (CIPRO) 500 MG tablet Take 1 tablet (500 mg total) by mouth 2 (two) times daily. If needed for 3 loose stools in 24hr. Can stop if diarrhea resolves 10 tablet 0  . dexlansoprazole (DEXILANT) 60 MG capsule Take  1 capsule (60 mg total) by mouth daily. 15 capsule 0  . fexofenadine (ALLEGRA) 180 MG tablet Take 180 mg by mouth daily.    . fluticasone (FLONASE) 50 MCG/ACT nasal spray Place 2 sprays into the nose daily. 16 g 8  . LINZESS 290 MCG CAPS capsule Take 290 mcg by mouth every other day.   3  . Multiple Vitamins-Calcium (VIACTIV MULTI-VITAMIN) CHEW Chew 2 each by mouth daily.    . Nitrofurantoin Monohyd Macro (MACROBID PO) Take 50 mg by mouth daily as needed (takes only prior to intercourse).    Marland Kitchen typhoid (VIVOTIF BERNA VACCINE) DR capsule Take 1 capsule by mouth every other day. 4 capsule 0  . Wheat Dextrin (BENEFIBER) POWD Take 10 mLs by mouth daily.     No current facility-administered medications for this  visit.    Allergies:   Morphine    Social History:  The patient  reports that she has never smoked. She has never used smokeless tobacco. She reports that she drinks about 2.5 oz of alcohol per week. She reports that she does not use illicit drugs.   Family History:  The patient's family history includes Alcohol abuse in her father; Arthritis in her mother; Asthma in her maternal grandfather; Cancer in her father; Colon polyps in her maternal grandfather; Depression in her father; Diabetes in her brother, father, and maternal grandmother; Heart attack in her maternal grandmother and paternal grandfather; Heart disease in her maternal grandmother; Heart disease (age of onset: 35) in her paternal grandfather; Hypertension in her maternal grandfather and maternal grandmother; Hypothyroidism in her maternal grandmother and mother; Stroke in her maternal grandfather; Ulcers in her father.    ROS:  Please see the history of present illness.   Otherwise, review of systems are positive for none.   All other systems are reviewed and negative.    PHYSICAL EXAM: VS:  BP 88/52 mmHg  Pulse 79  Ht 5' 3.5" (1.613 m)  Wt 127 lb (57.607 kg)  BMI 22.14 kg/m2  SpO2 99% , BMI Body mass index is 22.14 kg/(m^2). GEN: Well nourished, well developed, in no acute distress HEENT: normal Neck: no JVD, carotid bruits, or masses Cardiac: RRR; no murmurs, rubs, or gallops,no edema  Respiratory:  clear to auscultation bilaterally, normal work of breathing GI: soft, nontender, nondistended, + BS MS: no deformity or atrophy Skin: warm and dry, no rash Neuro:  Strength and sensation are intact Psych: euthymic mood, full affect   EKG:  EKG is not ordered today. The ekg ordered today demonstrates NSR with no ST changes   Recent Labs: 02/04/2014: ALT 15; Platelets 208 11/05/2014: BUN 19; Creatinine 0.80; Hemoglobin 13.6; Potassium 3.3*; Sodium 142    Lipid Panel    Component Value Date/Time   CHOL 220*  09/17/2011 1008   TRIG 51 09/17/2011 1008   HDL 94 09/17/2011 1008   CHOLHDL 2.3 09/17/2011 1008   VLDL 10 09/17/2011 1008   LDLCALC 116* 09/17/2011 1008      Wt Readings from Last 3 Encounters:  11/18/14 127 lb (57.607 kg)  11/11/14 124 lb 12.8 oz (56.609 kg)  11/05/14 128 lb (58.06 kg)        ASSESSMENT AND PLAN:  1.  Atypical chest pain that is most c/w GERD and improved with Dexilant.  EKG is nonischemic.  Her only CRF is family history in her grandparents and surgical menopause.  She does not smoke.  I will get an ETT to assess for ischemia.  Current medicines are reviewed at length with the patient today.  The patient does not have concerns regarding medicines.  The following changes have been made:  no change  Labs/ tests ordered today include: see above assessment and plan  Orders Placed This Encounter  Procedures  . Exercise Tolerance Test     Disposition:   FU with me PRN pending results of stress test

## 2014-11-18 ENCOUNTER — Ambulatory Visit (INDEPENDENT_AMBULATORY_CARE_PROVIDER_SITE_OTHER): Payer: BLUE CROSS/BLUE SHIELD | Admitting: Cardiology

## 2014-11-18 ENCOUNTER — Encounter: Payer: Self-pay | Admitting: Cardiology

## 2014-11-18 VITALS — BP 88/52 | HR 79 | Ht 63.5 in | Wt 127.0 lb

## 2014-11-18 DIAGNOSIS — R079 Chest pain, unspecified: Secondary | ICD-10-CM | POA: Diagnosis not present

## 2014-11-18 NOTE — Patient Instructions (Signed)
Medication Instructions:  Your physician recommends that you continue on your current medications as directed. Please refer to the Current Medication list given to you today.   Labwork: None  Testing/Procedures: Your physician has requested that you have an exercise tolerance test. For further information please visit [email or web address]. Please also follow instruction sheet, as given.  Follow-Up: Your physician recommends that you schedule a follow-up appointment AS NEEDED with Dr. Radford Pax pending your study results.   Any Other Special Instructions Will Be Listed Below (If Applicable). Stay hydrated! Drink lots of water.

## 2014-11-22 ENCOUNTER — Telehealth: Payer: Self-pay | Admitting: Oncology

## 2014-11-22 ENCOUNTER — Telehealth: Payer: Self-pay | Admitting: Family Medicine

## 2014-11-22 DIAGNOSIS — K219 Gastro-esophageal reflux disease without esophagitis: Secondary | ICD-10-CM

## 2014-11-22 MED ORDER — DEXLANSOPRAZOLE 60 MG PO CPDR: 60.0000 mg | DELAYED_RELEASE_CAPSULE | Freq: Every day | ORAL | Status: DC

## 2014-11-22 NOTE — Telephone Encounter (Signed)
Returned Advertising account executive. Patient reschedule appointment due to going out of town. confirmed 07/20 & 07/27.

## 2014-11-22 NOTE — Telephone Encounter (Signed)
Note reviewed, that wasn't mentioned. Dexilant likely not covered by her insurance, but okay to try rx #30, no refill.  If not covered, change to Nexium 40mg  #30, no refill

## 2014-11-22 NOTE — Telephone Encounter (Signed)
Pt said cardiologist states she needs to be on Dexilant for 1 more month.  Please call in to [place] at [facility]

## 2014-11-25 ENCOUNTER — Other Ambulatory Visit: Payer: Self-pay | Admitting: *Deleted

## 2014-11-25 DIAGNOSIS — K219 Gastro-esophageal reflux disease without esophagitis: Secondary | ICD-10-CM

## 2014-11-25 MED ORDER — DEXLANSOPRAZOLE 60 MG PO CPDR: 60.0000 mg | DELAYED_RELEASE_CAPSULE | Freq: Every day | ORAL | Status: DC

## 2014-12-20 ENCOUNTER — Telehealth: Payer: Self-pay | Admitting: Family Medicine

## 2014-12-20 NOTE — Telephone Encounter (Signed)
I don't recall ever discussing this with her.  She may have discussed it with Dr. Baxter Flattery, as she had a travel consult with her the day after I saw her last for acute visit for chest pain.  Acetazolamide dosing for prevention of altitude sickness is 125mg  twice daily; to start 24 hours prior to ascent, and stop 2-3 days after arrival on peak, or upon descent.  I do believe there is a side effect of kidney stones, and given her recent surgery, she may want to discuss and get the okay from her urologist before taking this medication.  Common side effects include fatigue, malaise, GI side effects (decreased appetite, nausea, vomiting, diarrhea), numbness/tingling among many others.  I have had patients who felt worse from the medication than the headache from the altitude  We did not have this discussion at all during any visit. I know she is leaving soon, so she needs to be aware of these potential risks and side effects, to decide if she wants to take, and I would have her check with her urologist.  Need to find out how long her trip is (climbing, up until she starts descent) as I need to know how many pills she needs.

## 2014-12-20 NOTE — Telephone Encounter (Signed)
Requesting script for Diamox for altitude for trip to [place]. Pt says this was discussed with Dr Tomi Bamberger. Call pt at [phone] if have any questions.

## 2014-12-21 MED ORDER — ACETAZOLAMIDE 250 MG PO TABS: 250.0000 mg | ORAL_TABLET | Freq: Two times a day (BID) | ORAL | Status: DC

## 2014-12-21 NOTE — Telephone Encounter (Signed)
There are different doses for prevention and for altitude sickness treatment.  Prevention is what I described below--125mg  twice daily.  Probably needs about #14 to be safe--not sure if they each want a bottle of #14, or a bottle of #28 for the both of them.  For TREATMENT of altitude sickness, not prevention, it is 250mg  twice daily.  I can't tell from message if they want to have it on hand to TREAT in case they get sick, or use it preventatively.  Okay for rx as above (see if they want 1 or 2 rx's)

## 2014-12-21 NOTE — Telephone Encounter (Signed)
I'm headed out (in a rush)--please send prescription for diamox (generic) 250mg  BID #28 to their pharmacy.  This is for TREATMENT dose, if they feel sick not prevention, and is enough for both of them.  Thank you.

## 2014-12-21 NOTE — Telephone Encounter (Signed)
Send rx to pharmacy

## 2014-12-21 NOTE — Telephone Encounter (Signed)
They want to have it on hand just for treatment just in case and one RX is fine she stated they will not take it if needed

## 2014-12-21 NOTE — Telephone Encounter (Signed)
Fly to [place] this Wed. 6,000 feet Sat accent 14,000 feet until [employer] and fly back Thurs. 17th

## 2015-01-03 ENCOUNTER — Encounter: Payer: Self-pay | Admitting: Family Medicine

## 2015-01-03 ENCOUNTER — Ambulatory Visit (INDEPENDENT_AMBULATORY_CARE_PROVIDER_SITE_OTHER): Payer: BLUE CROSS/BLUE SHIELD | Admitting: Family Medicine

## 2015-01-03 VITALS — BP 100/60 | HR 64 | Temp 97.2°F | Ht 63.0 in | Wt 127.2 lb

## 2015-01-03 DIAGNOSIS — R319 Hematuria, unspecified: Secondary | ICD-10-CM

## 2015-01-03 DIAGNOSIS — Z87442 Personal history of urinary calculi: Secondary | ICD-10-CM

## 2015-01-03 DIAGNOSIS — N3000 Acute cystitis without hematuria: Secondary | ICD-10-CM | POA: Diagnosis not present

## 2015-01-03 DIAGNOSIS — R350 Frequency of micturition: Secondary | ICD-10-CM

## 2015-01-03 DIAGNOSIS — R109 Unspecified abdominal pain: Secondary | ICD-10-CM | POA: Diagnosis not present

## 2015-01-03 LAB — POCT URINALYSIS DIPSTICK
Bilirubin, UA: NEGATIVE
Glucose, UA: NEGATIVE
Ketones, UA: NEGATIVE
Nitrite, UA: NEGATIVE
Protein, UA: NEGATIVE
Spec Grav, UA: 1.025
UROBILINOGEN UA: NEGATIVE
pH, UA: 6

## 2015-01-03 NOTE — Patient Instructions (Signed)
Drink plenty of fluids.  I suspect more likely a kidney stone rather than infection based on your history and urine results from today. Continue the AZO if needed, along with anti-inflammatories (ie ibuprofen). We will send your urine for culture, and contact you if antibiotics are needed. If you have worsening infection symptoms (odor, increasing pain with urination, fever), please contact us sooner. If your pain is worsening (ie kidney stone)--you may need a CT.

## 2015-01-03 NOTE — Progress Notes (Signed)
Chief Complaint  Patient presents with  . Urinary Tract Infection    lbp pain yesterday. Woke up with urgency and frequency. Did see some visible blood this am.    Yesterday she felt tired, and had some right sided flank pain.  This morning at 4am she had some urgency, frequency, saw blood on the toilet paper.  Toilet water was not pink/red.  Felt similar to UTI's in the past. She took 2 OTC azo at 4am.  She continues to have some flank pain.  Urgency is improved.  Overall feeling a little better today than earlier this morning.  [place] with her daughter, returned from [place] this weekend.  Did not get altitude sickness (didn't need to start the diamox).  She kept herself well hydrated while hiking, but possibly not as good while in the airport.  PMH, PSH, SH reviewed. Outpatient Encounter Prescriptions as of 01/03/2015  Medication Sig Note  . esomeprazole (NEXIUM) 20 MG capsule Take 20 mg by mouth daily at 12 noon.   . fexofenadine (ALLEGRA) 180 MG tablet Take 180 mg by mouth daily.   . fluticasone (FLONASE) 50 MCG/ACT nasal spray Place 2 sprays into the nose daily.   Marland Kitchen LINZESS 290 MCG CAPS capsule Take 290 mcg by mouth every other day.  08/09/2014: Received from: External Pharmacy  . Multiple Vitamins-Calcium (VIACTIV MULTI-VITAMIN) CHEW Chew 2 each by mouth daily.   . Wheat Dextrin (BENEFIBER) POWD Take 10 mLs by mouth daily.   . [DISCONTINUED] acetaZOLAMIDE (DIAMOX) 250 MG tablet Take 1 tablet (250 mg total) by mouth 2 (two) times daily.   . [DISCONTINUED] ciprofloxacin (CIPRO) 500 MG tablet Take 1 tablet (500 mg total) by mouth 2 (two) times daily. If needed for 3 loose stools in 24hr. Can stop if diarrhea resolves   . [DISCONTINUED] dexlansoprazole (DEXILANT) 60 MG capsule Take 1 capsule (60 mg total) by mouth daily.   . [DISCONTINUED] Nitrofurantoin Monohyd Macro (MACROBID PO) Take 50 mg by mouth daily as needed (takes only prior to intercourse).   . [DISCONTINUED] typhoid  (VIVOTIF BERNA VACCINE) DR capsule Take 1 capsule by mouth every other day.    No facility-administered encounter medications on file as of 01/03/2015.   Allergies  Allergen Reactions  . Morphine     REACTION: itching   ROS:  No nausea, vomiting, fever, chills. No vaginal discharge, bleeding, bruising, rash, URI symptoms, chest pain, shortness of breath or other concerns.  PHYSICAL EXAM: BP 100/60 mmHg  Pulse 64  Temp(Src) 97.2 F (36.2 C) (Tympanic)  Ht 5\' 3"  (1.6 m)  Wt 127 lb 3.2 oz (57.698 kg)  BMI 22.54 kg/m2 Well developed, pleasant female in no distress  Abdomen nontender in suprapubic area, slightly tender in RUQ. Abdomen is soft, normal bowel sounds. Back: nontender with palpation over CVA (although reports it feels "sore", didn't hurt to palpate/tap)  Urine dip:  SG 1.025, trace leuks, trace blood  ASSESSMENT/PLAN:  Right flank pain  Urinary frequency - Plan: POCT Urinalysis Dipstick, Urine culture  Hematuria - Plan: Urine culture  Personal history of kidney stones   Drink plenty of fluids.  I suspect more likely a kidney stone rather than infection based on your history and urine results from today. Continue the AZO if needed, along with anti-inflammatories (ie ibuprofen). We will send your urine for culture, and contact you if antibiotics are needed. If you have worsening infection symptoms (odor, increasing pain with urination, fever), please contact us sooner. If your pain is  worsening (ie kidney stone)--you may need a CT.

## 2015-01-04 ENCOUNTER — Ambulatory Visit: Payer: BLUE CROSS/BLUE SHIELD | Admitting: Nurse Practitioner

## 2015-01-04 ENCOUNTER — Other Ambulatory Visit: Payer: Self-pay | Admitting: *Deleted

## 2015-01-04 DIAGNOSIS — R9439 Abnormal result of other cardiovascular function study: Secondary | ICD-10-CM

## 2015-01-04 DIAGNOSIS — R079 Chest pain, unspecified: Secondary | ICD-10-CM

## 2015-01-04 LAB — EXERCISE TOLERANCE TEST
MPHR: 168 {beats}/min
Rest HR: 75 {beats}/min

## 2015-01-06 LAB — URINE CULTURE: Colony Count: >=100000

## 2015-01-06 MED ORDER — SULFAMETHOXAZOLE-TRIMETHOPRIM 800-160 MG PO TABS: 1.0000 | ORAL_TABLET | Freq: Two times a day (BID) | ORAL | Status: DC

## 2015-01-06 NOTE — Addendum Note (Signed)
Addended byRita Ohara on: 01/06/2015 01:40 PM   Modules accepted: Orders

## 2015-01-13 ENCOUNTER — Telehealth (HOSPITAL_COMMUNITY): Payer: Self-pay | Admitting: *Deleted

## 2015-01-13 NOTE — Telephone Encounter (Signed)
Patient given detailed instructions per Myocardial Perfusion Study Information Sheet for test on 01/19/15 at 0830. Patient Notified to arrive 15 minutes early, and that it is imperative to arrive on time for appointment to keep from having the test rescheduled. Patient verbalized understanding. Kristen Gamble, Ranae Palms

## 2015-01-19 ENCOUNTER — Ambulatory Visit (HOSPITAL_COMMUNITY): Payer: BLUE CROSS/BLUE SHIELD | Attending: Nurse Practitioner

## 2015-01-19 DIAGNOSIS — R9439 Abnormal result of other cardiovascular function study: Secondary | ICD-10-CM

## 2015-01-19 LAB — MYOCARDIAL PERFUSION IMAGING
Estimated workload: 14.3 METS
Exercise duration (min): 12 min
Exercise duration (sec): 30 s
LV dias vol: 83 mL
LV sys vol: 32 mL
MPHR: 168 {beats}/min
Peak HR: 153 {beats}/min
Percent HR: 91 %
RATE: 0.29
RPE: 16
Rest HR: 59 {beats}/min
SDS: 2
SRS: 0
SSS: 2
TID: 1

## 2015-01-19 MED ORDER — TECHNETIUM TC 99M SESTAMIBI GENERIC - CARDIOLITE
31.2000 | Freq: Once | INTRAVENOUS | Status: AC | PRN
  Administered 2015-01-19: 31.2 via INTRAVENOUS

## 2015-01-19 MED ORDER — TECHNETIUM TC 99M SESTAMIBI GENERIC - CARDIOLITE
9.7000 | Freq: Once | INTRAVENOUS | Status: AC | PRN
  Administered 2015-01-19: 10 via INTRAVENOUS

## 2015-01-24 ENCOUNTER — Other Ambulatory Visit: Payer: BLUE CROSS/BLUE SHIELD

## 2015-01-31 ENCOUNTER — Ambulatory Visit: Payer: BLUE CROSS/BLUE SHIELD | Admitting: Oncology

## 2015-02-01 ENCOUNTER — Other Ambulatory Visit: Payer: Self-pay | Admitting: *Deleted

## 2015-02-01 DIAGNOSIS — C50919 Malignant neoplasm of unspecified site of unspecified female breast: Secondary | ICD-10-CM

## 2015-02-02 ENCOUNTER — Other Ambulatory Visit (HOSPITAL_BASED_OUTPATIENT_CLINIC_OR_DEPARTMENT_OTHER): Payer: BLUE CROSS/BLUE SHIELD

## 2015-02-02 DIAGNOSIS — C50919 Malignant neoplasm of unspecified site of unspecified female breast: Secondary | ICD-10-CM

## 2015-02-02 LAB — CBC WITH DIFFERENTIAL/PLATELET
BASO%: 0 % (ref 0.0–2.0)
BASOS ABS: 0 10*3/uL (ref 0.0–0.1)
EOS%: 1.1 % (ref 0.0–7.0)
Eosinophils Absolute: 0.1 10*3/uL (ref 0.0–0.5)
HCT: 41.4 % (ref 34.8–46.6)
HEMOGLOBIN: 13.8 g/dL (ref 11.6–15.9)
LYMPH#: 1.7 10*3/uL (ref 0.9–3.3)
LYMPH%: 37.5 % (ref 14.0–49.7)
MCH: 32 pg (ref 25.1–34.0)
MCHC: 33.3 g/dL (ref 31.5–36.0)
MCV: 96.1 fL (ref 79.5–101.0)
MONO#: 0.4 10*3/uL (ref 0.1–0.9)
MONO%: 9 % (ref 0.0–14.0)
NEUT%: 52.4 % (ref 38.4–76.8)
NEUTROS ABS: 2.4 10*3/uL (ref 1.5–6.5)
Platelets: 214 10*3/uL (ref 145–400)
RBC: 4.31 10*6/uL (ref 3.70–5.45)
RDW: 13.5 % (ref 11.2–14.5)
WBC: 4.6 10*3/uL (ref 3.9–10.3)

## 2015-02-02 LAB — COMPREHENSIVE METABOLIC PANEL (CC13)
ALT: 28 U/L (ref 0–55)
AST: 26 U/L (ref 5–34)
Albumin: 4 g/dL (ref 3.5–5.0)
Alkaline Phosphatase: 83 U/L (ref 40–150)
Anion Gap: 8 mEq/L (ref 3–11)
BUN: 20.3 mg/dL (ref 7.0–26.0)
CO2: 26 meq/L (ref 22–29)
Calcium: 10.6 mg/dL — ABNORMAL HIGH (ref 8.4–10.4)
Chloride: 107 mEq/L (ref 98–109)
Creatinine: 0.7 mg/dL (ref 0.6–1.1)
GLUCOSE: 80 mg/dL (ref 70–140)
Potassium: 4.1 mEq/L (ref 3.5–5.1)
SODIUM: 141 meq/L (ref 136–145)
Total Bilirubin: 0.38 mg/dL (ref 0.20–1.20)
Total Protein: 6.9 g/dL (ref 6.4–8.3)

## 2015-02-03 ENCOUNTER — Encounter: Payer: Self-pay | Admitting: *Deleted

## 2015-02-09 ENCOUNTER — Ambulatory Visit (HOSPITAL_BASED_OUTPATIENT_CLINIC_OR_DEPARTMENT_OTHER): Payer: BLUE CROSS/BLUE SHIELD | Admitting: Oncology

## 2015-02-09 ENCOUNTER — Ambulatory Visit: Payer: BLUE CROSS/BLUE SHIELD

## 2015-02-09 ENCOUNTER — Telehealth: Payer: Self-pay | Admitting: Oncology

## 2015-02-09 VITALS — BP 105/67 | HR 62 | Temp 97.8°F | Resp 18 | Ht 63.5 in | Wt 130.6 lb

## 2015-02-09 DIAGNOSIS — C50912 Malignant neoplasm of unspecified site of left female breast: Secondary | ICD-10-CM | POA: Diagnosis not present

## 2015-02-09 DIAGNOSIS — R911 Solitary pulmonary nodule: Secondary | ICD-10-CM | POA: Diagnosis not present

## 2015-02-09 DIAGNOSIS — M899 Disorder of bone, unspecified: Secondary | ICD-10-CM | POA: Diagnosis not present

## 2015-02-09 NOTE — Telephone Encounter (Signed)
Patient sent to the lab,avs pritned for patient and note to gretchen for bsnp 2017

## 2015-02-09 NOTE — Progress Notes (Signed)
ID: Kristen Gamble   DOB: Oct 17, 1960  MR#: [id number]  OMV#:[id number]  PCP: Vikki Ports, MD GYN:Tim Maryan Rued SU:  OTHER MD: Leta Baptist, Virgilio Belling Buccini  CHIEF COMPLAINT: Estrogen receptor positive breast cancer  CURRENT TREATMENT: Tamoxifen  HISTORY OF PRESENT ILLNESS: From the original intake note:   I do not have records before 10/2006, but in that month the patient underwent bilateral mastectomy for left sided breast cancer, which proved to be an invasive ductal carcinoma, grade 1, measuring 6 mm, in the setting of DCIS (grade 2).  One of the margins was positive focally.  The tumor was ER and PR more than 95% positive, Hercept test negative (at 0/1), and she had an Oncotype recurrence score of 13 predicting a distant risk of recurrence of 8% if she took tamoxifen for 5 years.  In 11/2006 the patient underwent a second excision to clear the margins.  There was a 5 mm residual focus of tumor.  Left axillary lymph node sampling showed a negative sentinel lymph node.  Margins were negative, although close (margins for DCIS were less than 1 mm, and for the invasive tumor less than 2 mm, both deep).  She had lateral subpectoral implants placed, and has been on Femara with fairly significant side effects that are a major source of concern to the patient.  In addition, the patient underwent TAH/BSO 03/2007 with benign pathology.  She has also had multiple other breast biopsies (12/2006, two right sided benign breast lesions; 08/2007, removal of a dermatofibroma; 12/2007, removal of a 2 mm left breast mass which was benign, and 04/2008 a left breast biopsy which proved to be scar).  The patient has had extensive studies including CT of chest, abdomen, and pelvis 12/2007, which was negative.  She had a bone scan in 05/2007 which was negative.   Her subsequent history is as detailed below.  INTERVAL HISTORY: Kristen Gamble returns for followup of her breast cancer.  She had been on  tamoxifen and Vagifem suppositories but after the last visit here she went off both. She did undergo the "Josph Macho" procedure and although she has only had 3 of the 4 treatments she does say that the vaginal atrophy is improved.  REVIEW OF SYSTEMS:  She has been having GI problems , with decreased motility. This was accompanied by reflux and she just had a very thorough cardiac workup which was all negative. She is scheduled for EGD and colonoscopy under [facility] to complete the workup. For now she has gained a little weight, feels tired, has a dry cough, has had kidney stones, which she tells me are new problem for her , some back pain and arthritis which is not new, and some hot flashes. A detailed review of systems today was otherwise stable.  PAST MEDICAL HISTORY: Past Medical History  Diagnosis Date  . Breast cancer 10/2006    left  . Osteopenia 05/2013    T score minus 1.7  . ADHD (attention deficit hyperactivity disorder) 08/2011  . TMJ (temporomandibular joint disorder)   . Arthritis   . Complication of anesthesia     nausea  . Numbness     under l arm  . History of kidney stones   . Hematuria   . Nephrolithiasis     and stone distal R ureter on CT and R hydronephrosis    PAST SURGICAL HISTORY: Past Surgical History  Procedure Laterality Date  . Breast surgery  2008    Lumpectomy-Bilateral mastectomy-implants  .  Augmentation mammaplasty  1999  . Total abdominal hysterectomy w/ bilateral salpingoophorectomy  9/08  . Tonsillectomy  1971  . Breast biopsy      x4 in 2009-2012, all benign  . Pelvic laparoscopy      LAP HYST BSO  . Laparoscopic total hysterectomy    . Gynecologic cryosurgery  age 50  . Foot surgery  2014    Right big toe  . Colposcopy    . Cystoscopy    . Cystoscopy/retrograde/ureteroscopy/stone extraction with basket Right 11/13/2013    Procedure: CYSTOSCOPY, RIGHT RETROGRADE PYLOGRAM WITH INTERPRETATION, RIGHT URETEROSCOPY WITH DILATION OF URETERAL  STRICTURE WITH BASKET EXTRACTION OF STONE;  Surgeon: Ailene Rud, MD;  Location: [facility];  Service: Urology;  Laterality: Right;  . Mastectomy Bilateral 01-2007  . Cystoscopy with retrograde pyelogram, ureteroscopy and stent placement Left 11/05/2014    Procedure: CYSTOSCOPY WITH RETROGRADE PYELOGRAM, URETEROSCOPY AND STENT PLACEMENT;  Surgeon: Franchot Gallo, MD;  Location: [facility];  Service: Urology;  Laterality: Left;  . Stone extraction with basket Left 11/05/2014    Procedure: STONE EXTRACTION WITH BASKET;  Surgeon: Franchot Gallo, MD;  Location: [facility];  Service: Urology;  Laterality: Left;    FAMILY HISTORY Family History  Problem Relation Age of Onset  . Diabetes Father   . Cancer Father     stomach  . Alcohol abuse Father   . Depression Father   . Ulcers Father   . Diabetes Maternal Grandmother   . Hypertension Maternal Grandmother   . Heart attack Maternal Grandmother   . Heart disease Maternal Grandmother   . Hypothyroidism Maternal Grandmother   . Hypertension Maternal Grandfather   . Stroke Maternal Grandfather   . Asthma Maternal Grandfather   . Colon polyps Maternal Grandfather   . Heart attack Paternal Grandfather   . Heart disease Paternal Grandfather 80    died of MI  . Arthritis Mother   . Hypothyroidism Mother   . Diabetes Brother   The patient's father died at the age of 28 in the setting of alcoholism.  The patient's mother is alive. There is a brother in good health.  There is no other history of breast cancer or ovarian cancer in the family.  GYNECOLOGIC HISTORY: She is GX, P2.  First pregnancy to term age 54.  She has had significant problems with hot flashes and vaginal dryness since she had her TAH/BSO in September of 2008.  SOCIAL HISTORY: Kristen Gamble is very active in our [name]. 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  Her business is called [name].   Their surviving child [name], is heading for college.  Their second child, [name], died at the age of 2.5 from a viral myocarditis.  [name] has a son from a prior marriage, also called [name], and he and the patient have adopted a pair of twins.  The patient attends [employer] DIRECTIVES: in place  HEALTH MAINTENANCE: History  Substance Use Topics  . Smoking status: Never Smoker   . Smokeless tobacco: Never Used  . Alcohol Use: 2.5 oz/week    5 Standard drinks or equivalent per week     Comment: 1 glass of wine 4-5 times week.     Colonoscopy: 2015, f/u recommended in 3 years  PAP: s/p TAH/BSO  Bone density: 06/05/2013, osteopenia  Lipid panel: 2013  Allergies  Allergen Reactions  . Morphine     REACTION: itching  Current Outpatient Prescriptions  Medication Sig Dispense Refill  . fexofenadine (ALLEGRA) 180 MG tablet Take 180 mg by mouth daily.    . fluticasone (FLONASE) 50 MCG/ACT nasal spray Place 2 sprays into the nose daily. 16 g 8  . LINZESS 290 MCG CAPS capsule Take 290 mcg by mouth every other day.   3  . Wheat Dextrin (BENEFIBER) POWD Take 10 mLs by mouth daily.     No current facility-administered medications for this visit.    OBJECTIVE: Middle-aged white woman  Who appears well Filed Vitals:   02/09/15 0932  BP: 105/67  Pulse: 62  Temp: 97.8 F (36.6 C)  Resp: 18     Body mass index is 22.77 kg/(m^2).    ECOG FS: 0  Sclerae unicteric, pupils round and equal Oropharynx clear and moist-- no thrush or other lesions No cervical or supraclavicular adenopathy Lungs no rales or rhonchi Heart regular rate and rhythm Abd soft, nontender, positive bowel sounds , no masses palpated MSK no focal spinal tenderness, no upper extremity lymphedema Neuro: nonfocal, well oriented, appropriate affect Breasts:  Status post bilateral mastectomies with reconstruction. There is no evidence of local recurrence. Both axillae are benign.   LAB  RESULTS: Lab Results  Component Value Date   WBC 4.6 02/02/2015   NEUTROABS 2.4 02/02/2015   HGB 13.8 02/02/2015   HCT 41.4 02/02/2015   MCV 96.1 02/02/2015   PLT 214 02/02/2015      Chemistry      Component Value Date/Time   NA 141 02/02/2015 0805   NA 142 11/05/2014 1202   K 4.1 02/02/2015 0805   K 3.3* 11/05/2014 1202   CL 104 11/05/2014 1202   CO2 26 02/02/2015 0805   CO2 29 11/12/2013 0840   BUN 20.3 02/02/2015 0805   BUN 19 11/05/2014 1202   CREATININE 0.7 02/02/2015 0805   CREATININE 0.80 11/05/2014 1202   CREATININE 0.95 11/06/2012 1406      Component Value Date/Time   CALCIUM 10.6* 02/02/2015 0805   CALCIUM 11.0* 11/12/2013 0840   ALKPHOS 83 02/02/2015 0805   ALKPHOS 57 11/06/2012 1406   AST 26 02/02/2015 0805   AST 21 11/06/2012 1406   ALT 28 02/02/2015 0805   ALT 18 11/06/2012 1406   BILITOT 0.38 02/02/2015 0805   BILITOT 0.3 11/06/2012 1406       Lab Results  Component Value Date   LABCA2 27 02/04/2012    No components found for: [id number]  No results for input(s): INR in the last 168 hours.  Urinalysis    Component Value Date/Time   COLORURINE YELLOW 10/28/2013 1010   APPEARANCEUR CLOUDY* 10/28/2013 1010   LABSPEC 1.015 10/28/2013 1010   PHURINE 7.5 10/28/2013 1010   GLUCOSEU NEG 10/28/2013 1010   HGBUR LARGE* 10/28/2013 1010   BILIRUBINUR neg 01/03/2015 1336   BILIRUBINUR NEG 10/28/2013 1010   KETONESUR NEG 10/28/2013 1010   PROTEINUR neg 01/03/2015 1336   PROTEINUR NEG 10/28/2013 1010   UROBILINOGEN negative 01/03/2015 1336   UROBILINOGEN 0.2 10/28/2013 1010   NITRITE neg 01/03/2015 1336   NITRITE NEG 10/28/2013 1010   LEUKOCYTESUR Trace* 01/03/2015 1336    STUDIES: DEXA scan July 2012 showed osteopenia with a T score of -1.7 , repeat 06/05/2013 was stable  Myocardial perfusion scan 01/19/2015  Nuclear stress EF: 61%.  Upsloping ST segment depression ST segment depression of 1 mm was noted during stress in the inferior and  inferolateral leads (II, III, aVF, V5 and V6), and  returning to baseline after 1-5 minutes of recovery.  The study is normal.  This is a low risk study.  The left ventricular ejection fraction is normal (55-65%).  No ischemia  Equivocal EKG stress findings.  ASSESSMENT: 54 y.o. [place] woman status post bilateral mastectomies with subpectoral implant placement July 2008 for a T1c N0, grade 1 invasive ductal carcinoma, strongly estrogen and progesterone receptor positive, HER2/neu negative, Oncotype DX recurrence score of 13, predicting an 8% risk of recurrence after 5 years of tamoxifen.   She started antiestrogens September 2008, initially tamoxifen, then Femara with Lupron, switched to tamoxifen again November 2009, stopped tamoxifen at her own discretion December 2011, resumed July 2012,  discontinued July 2015 , when the patient went off Vagifem suppositories  PLAN: Mekiyah's calcium is again a little bit up. She has been having problems with kidney stones and of course she has osteopenia. I wonder if she has hyperparathyroidism. This might even explain her GI problems. I am setting her up for a PTH check today.   From a breast cancer point of view she is 8 years out from her definitive surgery with no evidence of disease recurrence. Her risk of recurrence at this point is likely in the 1-2% range. I am comfortable releasing her back to her primary care physician.   as far as breast cancer is concerned all she will need is yearly physician breast exam.   she reminded me that she had a lung nodule noted when she was at [facility]. We reviewed that. She had a chest CT scan June 8 09 which showed a 4 mm left upper lobe nodule. This was compared with a 05/20/2007 scan and there had been no change. We discussed possibly repeating a CT at this point, but  I really think all that would do is expose her to more radiation. Accordingly we will just not this nodule in case she has a CT of the chest for  any reason anytime in the future.   I will be glad to see Diane again at any point, but as of now were making no further routine appointments  With me here. On the other hand  We discussedour survivorship program and I have scheduled her with our survivorship nurse practitioner for February 2017.  Chauncey Cruel, [place] [phone] 02/09/2015

## 2015-02-10 ENCOUNTER — Ambulatory Visit: Payer: BC Managed Care – PPO | Admitting: Oncology

## 2015-02-10 ENCOUNTER — Other Ambulatory Visit: Payer: BC Managed Care – PPO

## 2015-02-10 LAB — PTH, INTACT AND CALCIUM
CALCIUM: 10.7 mg/dL — AB (ref 8.4–10.5)
PTH: 45 pg/mL (ref 14–64)

## 2015-02-15 ENCOUNTER — Encounter: Payer: Self-pay | Admitting: Oncology

## 2015-02-16 ENCOUNTER — Encounter: Payer: Self-pay | Admitting: Cardiology

## 2015-04-27 ENCOUNTER — Other Ambulatory Visit (HOSPITAL_COMMUNITY): Payer: Self-pay | Admitting: Endocrinology

## 2015-04-27 DIAGNOSIS — E210 Primary hyperparathyroidism: Secondary | ICD-10-CM

## 2015-05-11 ENCOUNTER — Ambulatory Visit (HOSPITAL_COMMUNITY)
Admission: RE | Admit: 2015-05-11 | Discharge: 2015-05-11 | Disposition: A | Discharge status: Home / Self Care | Payer: BLUE CROSS/BLUE SHIELD | Source: Ambulatory Visit | Attending: Endocrinology | Admitting: Endocrinology

## 2015-05-11 DIAGNOSIS — E210 Primary hyperparathyroidism: Secondary | ICD-10-CM

## 2015-05-11 DIAGNOSIS — E213 Hyperparathyroidism, unspecified: Secondary | ICD-10-CM | POA: Diagnosis present

## 2015-05-11 MED ORDER — TECHNETIUM TC 99M SESTAMIBI - CARDIOLITE
25.8000 | Freq: Once | INTRAVENOUS | Status: AC | PRN
  Administered 2015-05-11: 11:00:00 25.8 via INTRAVENOUS

## 2015-06-08 ENCOUNTER — Ambulatory Visit: Payer: Self-pay | Admitting: Surgery

## 2015-06-30 ENCOUNTER — Encounter (HOSPITAL_COMMUNITY)
Admission: RE | Admit: 2015-06-30 | Discharge: 2015-06-30 | Disposition: A | Discharge status: Home / Self Care | Payer: BLUE CROSS/BLUE SHIELD | Source: Ambulatory Visit | Attending: Surgery | Admitting: Surgery

## 2015-06-30 ENCOUNTER — Encounter (HOSPITAL_COMMUNITY): Payer: Self-pay

## 2015-06-30 DIAGNOSIS — Z853 Personal history of malignant neoplasm of breast: Secondary | ICD-10-CM | POA: Insufficient documentation

## 2015-06-30 DIAGNOSIS — E210 Primary hyperparathyroidism: Secondary | ICD-10-CM | POA: Insufficient documentation

## 2015-06-30 DIAGNOSIS — Z01818 Encounter for other preprocedural examination: Secondary | ICD-10-CM | POA: Insufficient documentation

## 2015-06-30 DIAGNOSIS — Z01812 Encounter for preprocedural laboratory examination: Secondary | ICD-10-CM | POA: Insufficient documentation

## 2015-06-30 DIAGNOSIS — F909 Attention-deficit hyperactivity disorder, unspecified type: Secondary | ICD-10-CM | POA: Diagnosis not present

## 2015-06-30 HISTORY — DX: Constipation, unspecified: K59.00

## 2015-06-30 HISTORY — DX: Other specified postprocedural states: Z98.890

## 2015-06-30 HISTORY — DX: Nausea with vomiting, unspecified: R11.2

## 2015-06-30 LAB — CBC
HCT: 40 % (ref 36.0–46.0)
Hemoglobin: 13.4 g/dL (ref 12.0–15.0)
MCH: 31.8 pg (ref 26.0–34.0)
MCHC: 33.5 g/dL (ref 30.0–36.0)
MCV: 95 fL (ref 78.0–100.0)
PLATELETS: 201 10*3/uL (ref 150–400)
RBC: 4.21 MIL/uL (ref 3.87–5.11)
RDW: 13.2 % (ref 11.5–15.5)
WBC: 5.3 10*3/uL (ref 4.0–10.5)

## 2015-06-30 LAB — BASIC METABOLIC PANEL
ANION GAP: 8 (ref 5–15)
BUN: 16 mg/dL (ref 6–20)
CALCIUM: 10.7 mg/dL — AB (ref 8.9–10.3)
CO2: 27 mmol/L (ref 22–32)
Chloride: 106 mmol/L (ref 101–111)
Creatinine, Ser: 0.68 mg/dL (ref 0.44–1.00)
GFR calc non Af Amer: >60 mL/min (ref >60)
Glucose, Bld: 102 mg/dL — ABNORMAL HIGH (ref 65–99)
Potassium: 3.9 mmol/L (ref 3.5–5.1)
Sodium: 141 mmol/L (ref 135–145)

## 2015-06-30 NOTE — Progress Notes (Signed)
Pt stated she saw Dr Radford Pax for chest pain but resulted in reflux. She was cleared by Dr Radford Pax per pt

## 2015-06-30 NOTE — Pre-Procedure Instructions (Signed)
    Kristen Gamble  06/30/2015      [id number] [place] [place] [id number] [place] [phone] Phone: [phone] Fax: [phone]  [id number] - [place], [place] [address] Phone: [phone] Fax: [phone]    Your procedure is scheduled on 07/07/15.  Report to [facility] Admitting at 730 A.M.  Call this number if you have problems the morning of surgery:  [phone]   Remember:  Do not eat food or drink liquids after midnight.  Take these medicines the morning of surgery with A SIP OF WATER --allerga,flonase   Do not wear jewelry, make-up or nail polish.  Do not wear lotions, powders, or perfumes.  You may wear deodorant.  Do not shave 48 hours prior to surgery.  Men may shave face and neck.  Do not bring valuables to the hospital.  [facility] is not responsible for any belongings or valuables.  Contacts, dentures or bridgework may not be worn into surgery.  Leave your suitcase in the car.  After surgery it may be brought to your room.  For patients admitted to the hospital, discharge time will be determined by your treatment team.  Patients discharged the day of surgery will not be allowed to drive home.   Name and phone number of your driver:    Special instructions:   Please read over the following fact sheets that you were given. Pain Booklet, Coughing and Deep Breathing and Surgical Site Infection Prevention

## 2015-07-01 NOTE — Progress Notes (Signed)
Anesthesia Chart Review:  Pt is 54 year old female scheduled for parathyroidectomy on 07/07/2015 with Dr. Harlow Asa.   PCP is Dr. Rita Ohara. Saw Dr. Fransico Him for eval of chest pain/pressure 11/18/14 (stress test as below).   PMH includes:  ADHD, breast cancer, TMJ, post-op N/V. Never smoker. BMI 23. S/p cystoscopy, ureteroscopy 11/05/14 and 11/13/13.   Preoperative labs reviewed.    EKG 11/11/14: sinus rhythm with 1st degree AV block. Possible LAE.   Nuclear stress test 01/19/15:   Nuclear stress EF: 61%.  Upsloping ST segment depression ST segment depression of 1 mm was noted during stress in the inferior and inferolateral leads (II, III, aVF, V5 and V6), and returning to baseline after 1-5 minutes of recovery.  The study is normal.  This is a low risk study.  The left ventricular ejection fraction is normal (55-65%).  No ischemia  Equivocal EKG stress findings.  Echo 04/03/10:  - Left ventricle: The cavity size was normal. Wall thickness was normal. Systolic function was normal. The estimated ejection fraction was in the range of 60% to 65%. Wall motion was normal; there were no regional wall motion abnormalities.  If no changes, I anticipate pt can proceed with surgery as scheduled.   Willeen Cass, FNP-BC [facility] Stay [facility]/Anesthesiology Phone: [phone] 07/01/2015 3:04 PM

## 2015-07-05 ENCOUNTER — Encounter (HOSPITAL_COMMUNITY): Payer: Self-pay | Admitting: Surgery

## 2015-07-05 DIAGNOSIS — E210 Primary hyperparathyroidism: Secondary | ICD-10-CM | POA: Diagnosis present

## 2015-07-05 NOTE — H&P (Signed)
General Surgery [facility]  Kristen Gamble. Kristen Gamble DOB: 08-10-1960 Married / Language: English / Race: White Female  History of Present Illness The patient is a 54 year old female who presents with a parathyroid neoplasm.  Patient is referred by Dr. Jacelyn Pi for evaluation of suspected primary hyperparathyroidism. Patient was noted on routine laboratory studies to have a mildly elevated serum calcium level of 10.4. Additional studies included a normal intact PTH level of 31, a normal vitamin D level of 31.7, and ionized calcium level of 5.6. Patient has had 2 episodes of nephrolithiasis requiring intervention. Stones were tested and contained calcium oxalate. Patient has a bone density scan showing osteopenia. She suffered an ankle fracture in 2015. She notes chronic fatigue. She complains of bone and joint pain. Patient does have a history of breast cancer and underwent bilateral mastectomy. She was treated with tamoxifen for 7 years. Patient denies any prior head or neck surgery. There is no family history of parathyroid disease. There is no family history of other endocrine neoplasms. Patient underwent nuclear medicine parathyroid scan on May 11, 2015. This localizes a parathyroid adenoma to the right inferior position, approximately 1-2 cm below the level of the thyroid gland. No additional imaging studies have been completed.   Other Problems Anxiety Disorder Arthritis Back Pain Breast Cancer Chest pain Diverticulosis Gastroesophageal Reflux Disease General anesthesia - complications Hemorrhoids Kidney Stone Lump In Breast Oophorectomy  Past Surgical History  Breast Biopsy multiple Breast Mass; Local Excision Bilateral. Breast Reconstruction Bilateral. Colon Polyp Removal - Colonoscopy Foot Surgery Right. Hysterectomy (due to cancer) - Complete Mastectomy Bilateral. Sentinel Lymph Node  Biopsy Tonsillectomy  Diagnostic Studies History Colonoscopy 1-5 years ago Mammogram >3 years ago Pap Smear 1-5 years ago  Allergies Morphine Sulfate ER *ANALGESICS - OPIOID* Itching.  Medication History Linzess (290MCG Capsule, Oral) Active. Flonase (50MCG/ACT Suspension, Nasal) Active. Allegra (180MG  Tablet, Oral) Active. Medications Reconciled  Social History  Alcohol use Occasional alcohol use. Caffeine use Coffee. No drug use Tobacco use Never smoker.  Family History Alcohol Abuse Father. Arthritis Mother. Cancer Father. Depression Father. Diabetes Mellitus Brother. Heart disease in female family member before age 57 Thyroid problems Mother.  Pregnancy / Birth History Age at menarche 89 years. Age of menopause <46 Contraceptive History Intrauterine device, Oral contraceptives. Gravida 2 Maternal age 71-35 Para 2  Review of Systems General Present- Fatigue and Weight Gain. Not Present- Appetite Loss, Chills, Fever, Night Sweats and Weight Loss. Skin Present- Dryness. Not Present- Change in Wart/Mole, Hives, Jaundice, New Lesions, Non-Healing Wounds, Rash and Ulcer. HEENT Present- Seasonal Allergies and Wears glasses/contact lenses. Not Present- Earache, Hearing Loss, Hoarseness, Nose Bleed, Oral Ulcers, Ringing in the Ears, Sinus Pain, Sore Throat, Visual Disturbances and Yellow Eyes. Respiratory Not Present- Bloody sputum, Chronic Cough, Difficulty Breathing, Snoring and Wheezing. Breast Not Present- Breast Mass, Breast Pain, Nipple Discharge and Skin Changes. Cardiovascular Present- Palpitations and Rapid Heart Rate. Not Present- Chest Pain, Difficulty Breathing Lying Down, Leg Cramps, Shortness of Breath and Swelling of Extremities. Gastrointestinal Present- Bloating, Constipation and Indigestion. Not Present- Abdominal Pain, Bloody Stool, Change in Bowel Habits, Chronic diarrhea, Difficulty Swallowing, Excessive gas, Gets full quickly at  meals, Hemorrhoids, Nausea, Rectal Pain and Vomiting. Female Genitourinary Present- Urgency. Not Present- Frequency, Nocturia, Painful Urination and Pelvic Pain. Musculoskeletal Present- Back Pain, Joint Pain, Joint Stiffness and Swelling of Extremities. Not Present- Muscle Pain and Muscle Weakness. Neurological Present- Headaches and Numbness. Not Present- Decreased Memory, Fainting, Seizures, Tingling, Tremor, Trouble  walking and Weakness. Psychiatric Present- Anxiety. Not Present- Bipolar, Change in Sleep Pattern, Depression, Fearful and Frequent crying. Endocrine Present- Hair Changes and Hot flashes. Not Present- Cold Intolerance, Excessive Hunger, Heat Intolerance and New Diabetes. Hematology Not Present- Easy Bruising, Excessive bleeding, Gland problems, HIV and Persistent Infections.  Vitals  Weight: 133 lb Height: 63.5in Body Surface Area: 1.64 m Body Mass Index: 23.19 kg/m  Temp.: 97.62F(Temporal)  Pulse: 63 (Regular)  BP: 128/72 (Sitting, Left Arm, Standard)   Physical Exam  General - appears comfortable, no distress; not diaphorectic  HEENT - normocephalic; sclerae clear, gaze conjugate; mucous membranes moist, dentition good; voice normal  Neck - symmetric on extension; no palpable anterior or posterior cervical adenopathy; no palpable masses in the thyroid bed  Chest - clear bilaterally without rhonchi, rales, or wheeze  Cor - regular rhythm with normal rate; no significant murmur  Ext - non-tender without significant edema or lymphedema  Neuro - grossly intact; no tremor    Assessment & Plan  PRIMARY HYPERPARATHYROIDISM (E21.0)  Patient presents today on referral from her endocrinologist with signs and symptoms of primary hyperparathyroidism. Patient is provided with written literature on parathyroid surgery to review at home.  Patient I have reviewed her laboratory studies as well as her sestamibi scan. Sestamibi scan was performed on May 11, 2015 and localizes a parathyroid adenoma to the right inferior position. I believe the patient is a good candidate for minimally invasive surgery.  I discussed the indications for minimally invasive parathyroidectomy. We discussed risk and benefits of the procedure. We discussed the possibility of a second gland adenoma. Patient will most likely be a candidate for outpatient surgery. We discussed her postoperative recovery, the location of the surgical incision, and her return to activity. She understands and wishes to proceed in the near future.  The risks and benefits of the procedure have been discussed at length with the patient. The patient understands the proposed procedure, potential alternative treatments, and the course of recovery to be expected. All of the patient's questions have been answered at this time. The patient wishes to proceed with surgery.  Earnstine Regal, MD, [facility] Office: [phone]

## 2015-07-06 MED ORDER — CEFAZOLIN SODIUM-DEXTROSE 2-3 GM-% IV SOLR
2.0000 g | INTRAVENOUS | Status: AC
  Administered 2015-07-07: 2 g via INTRAVENOUS
  Filled 2015-07-06: qty 50

## 2015-07-07 ENCOUNTER — Ambulatory Visit (HOSPITAL_COMMUNITY)
Admission: RE | Admit: 2015-07-07 | Discharge: 2015-07-07 | Disposition: A | Discharge status: Home / Self Care | Payer: BLUE CROSS/BLUE SHIELD | Source: Ambulatory Visit | Attending: Surgery | Admitting: Surgery

## 2015-07-07 ENCOUNTER — Ambulatory Visit (HOSPITAL_COMMUNITY): Payer: BLUE CROSS/BLUE SHIELD | Admitting: Emergency Medicine

## 2015-07-07 ENCOUNTER — Encounter (HOSPITAL_COMMUNITY): Payer: Self-pay | Admitting: *Deleted

## 2015-07-07 ENCOUNTER — Encounter (HOSPITAL_COMMUNITY)
Admission: RE | Disposition: A | Discharge status: Home / Self Care | Payer: Self-pay | Source: Ambulatory Visit | Attending: Surgery

## 2015-07-07 ENCOUNTER — Ambulatory Visit (HOSPITAL_COMMUNITY): Payer: BLUE CROSS/BLUE SHIELD | Admitting: Anesthesiology

## 2015-07-07 DIAGNOSIS — E210 Primary hyperparathyroidism: Secondary | ICD-10-CM | POA: Diagnosis present

## 2015-07-07 DIAGNOSIS — K219 Gastro-esophageal reflux disease without esophagitis: Secondary | ICD-10-CM | POA: Diagnosis not present

## 2015-07-07 DIAGNOSIS — Z79899 Other long term (current) drug therapy: Secondary | ICD-10-CM | POA: Diagnosis not present

## 2015-07-07 DIAGNOSIS — Z853 Personal history of malignant neoplasm of breast: Secondary | ICD-10-CM | POA: Insufficient documentation

## 2015-07-07 DIAGNOSIS — Z9013 Acquired absence of bilateral breasts and nipples: Secondary | ICD-10-CM | POA: Insufficient documentation

## 2015-07-07 HISTORY — PX: PARATHYROIDECTOMY: SHX19

## 2015-07-07 SURGERY — PARATHYROIDECTOMY
Anesthesia: General | Site: Neck

## 2015-07-07 MED ORDER — ROCURONIUM BROMIDE 50 MG/5ML IV SOLN
INTRAVENOUS | Status: AC
  Filled 2015-07-07: qty 1

## 2015-07-07 MED ORDER — PROPOFOL 500 MG/50ML IV EMUL
INTRAVENOUS | Status: DC | PRN
  Administered 2015-07-07: 25 ug/kg/min via INTRAVENOUS

## 2015-07-07 MED ORDER — BUPIVACAINE HCL (PF) 0.25 % IJ SOLN
INTRAMUSCULAR | Status: DC | PRN
  Administered 2015-07-07: 8 mL

## 2015-07-07 MED ORDER — GLYCOPYRROLATE 0.2 MG/ML IJ SOLN
INTRAMUSCULAR | Status: DC | PRN
  Administered 2015-07-07: 0.1 mg via INTRAVENOUS
  Administered 2015-07-07: 0.4 mg via INTRAVENOUS
  Administered 2015-07-07: 0.1 mg via INTRAVENOUS

## 2015-07-07 MED ORDER — SCOPOLAMINE 1 MG/3DAYS TD PT72
1.0000 | MEDICATED_PATCH | Freq: Once | TRANSDERMAL | Status: DC
  Administered 2015-07-07: 1.5 mg via TRANSDERMAL
  Filled 2015-07-07: qty 1

## 2015-07-07 MED ORDER — DEXAMETHASONE SODIUM PHOSPHATE 10 MG/ML IJ SOLN
INTRAMUSCULAR | Status: DC | PRN
  Administered 2015-07-07: 10 mg via INTRAVENOUS

## 2015-07-07 MED ORDER — ARTIFICIAL TEARS OP OINT
TOPICAL_OINTMENT | OPHTHALMIC | Status: DC | PRN
  Administered 2015-07-07: 1 via OPHTHALMIC

## 2015-07-07 MED ORDER — PROPOFOL 10 MG/ML IV BOLUS
INTRAVENOUS | Status: DC | PRN
  Administered 2015-07-07: 150 mg via INTRAVENOUS

## 2015-07-07 MED ORDER — ONDANSETRON HCL 4 MG/2ML IJ SOLN
INTRAMUSCULAR | Status: AC
  Filled 2015-07-07: qty 2

## 2015-07-07 MED ORDER — LACTATED RINGERS IV SOLN
INTRAVENOUS | Status: DC
  Administered 2015-07-07 (×3): via INTRAVENOUS

## 2015-07-07 MED ORDER — MIDAZOLAM HCL 2 MG/2ML IJ SOLN
INTRAMUSCULAR | Status: DC | PRN
  Administered 2015-07-07: 2 mg via INTRAVENOUS

## 2015-07-07 MED ORDER — LIDOCAINE HCL (CARDIAC) 20 MG/ML IV SOLN
INTRAVENOUS | Status: AC
  Filled 2015-07-07: qty 5

## 2015-07-07 MED ORDER — FENTANYL CITRATE (PF) 250 MCG/5ML IJ SOLN
INTRAMUSCULAR | Status: AC
  Filled 2015-07-07: qty 5

## 2015-07-07 MED ORDER — DEXAMETHASONE SODIUM PHOSPHATE 10 MG/ML IJ SOLN
INTRAMUSCULAR | Status: AC
  Filled 2015-07-07: qty 1

## 2015-07-07 MED ORDER — HYDROCODONE-ACETAMINOPHEN 5-325 MG PO TABS: 1.0000 | ORAL_TABLET | ORAL | Status: DC | PRN

## 2015-07-07 MED ORDER — GLYCOPYRROLATE 0.2 MG/ML IJ SOLN
INTRAMUSCULAR | Status: AC
Start: 2015-07-07 — End: 2015-07-07
  Filled 2015-07-07: qty 2

## 2015-07-07 MED ORDER — FENTANYL CITRATE (PF) 100 MCG/2ML IJ SOLN
25.0000 ug | INTRAMUSCULAR | Status: DC | PRN
  Administered 2015-07-07: 50 ug via INTRAVENOUS

## 2015-07-07 MED ORDER — NEOSTIGMINE METHYLSULFATE 10 MG/10ML IV SOLN
INTRAVENOUS | Status: DC | PRN
  Administered 2015-07-07: 3 mg via INTRAVENOUS

## 2015-07-07 MED ORDER — LIDOCAINE HCL (CARDIAC) 20 MG/ML IV SOLN
INTRAVENOUS | Status: DC | PRN
  Administered 2015-07-07: 100 mg via INTRATRACHEAL

## 2015-07-07 MED ORDER — PHENYLEPHRINE 40 MCG/ML (10ML) SYRINGE FOR IV PUSH (FOR BLOOD PRESSURE SUPPORT)
PREFILLED_SYRINGE | INTRAVENOUS | Status: AC
  Filled 2015-07-07: qty 10

## 2015-07-07 MED ORDER — FENTANYL CITRATE (PF) 250 MCG/5ML IJ SOLN
INTRAMUSCULAR | Status: DC | PRN
  Administered 2015-07-07: 50 ug via INTRAVENOUS
  Administered 2015-07-07: 100 ug via INTRAVENOUS
  Administered 2015-07-07 (×2): 50 ug via INTRAVENOUS

## 2015-07-07 MED ORDER — NEOSTIGMINE METHYLSULFATE 10 MG/10ML IV SOLN
INTRAVENOUS | Status: AC
  Filled 2015-07-07: qty 1

## 2015-07-07 MED ORDER — ONDANSETRON HCL 4 MG/2ML IJ SOLN
INTRAMUSCULAR | Status: DC | PRN
  Administered 2015-07-07: 4 mg via INTRAVENOUS

## 2015-07-07 MED ORDER — PHENYLEPHRINE HCL 10 MG/ML IJ SOLN
INTRAMUSCULAR | Status: DC | PRN
  Administered 2015-07-07: 120 ug via INTRAVENOUS

## 2015-07-07 MED ORDER — MIDAZOLAM HCL 2 MG/2ML IJ SOLN: 0.5000 mg | Freq: Once | INTRAMUSCULAR | Status: DC | PRN

## 2015-07-07 MED ORDER — PROMETHAZINE HCL 25 MG/ML IJ SOLN: 6.2500 mg | INTRAMUSCULAR | Status: DC | PRN

## 2015-07-07 MED ORDER — MIDAZOLAM HCL 2 MG/2ML IJ SOLN
INTRAMUSCULAR | Status: AC
  Filled 2015-07-07: qty 2

## 2015-07-07 MED ORDER — PROPOFOL 10 MG/ML IV BOLUS
INTRAVENOUS | Status: AC
  Filled 2015-07-07: qty 20

## 2015-07-07 MED ORDER — ROCURONIUM BROMIDE 100 MG/10ML IV SOLN
INTRAVENOUS | Status: DC | PRN
  Administered 2015-07-07: 40 mg via INTRAVENOUS

## 2015-07-07 MED ORDER — 0.9 % SODIUM CHLORIDE (POUR BTL) OPTIME
TOPICAL | Status: DC | PRN
  Administered 2015-07-07: 1000 mL

## 2015-07-07 MED ORDER — FENTANYL CITRATE (PF) 100 MCG/2ML IJ SOLN
INTRAMUSCULAR | Status: AC
  Filled 2015-07-07: qty 2

## 2015-07-07 MED ORDER — MEPERIDINE HCL 25 MG/ML IJ SOLN: 6.2500 mg | INTRAMUSCULAR | Status: DC | PRN

## 2015-07-07 MED ORDER — GLYCOPYRROLATE 0.2 MG/ML IJ SOLN
INTRAMUSCULAR | Status: AC
  Filled 2015-07-07: qty 1

## 2015-07-07 SURGICAL SUPPLY — 51 items
APL SKNCLS STERI-STRIP NONHPOA (GAUZE/BANDAGES/DRESSINGS) ×1
ATTRACTOMAT 16X20 MAGNETIC DRP (DRAPES) ×2 IMPLANT
BENZOIN TINCTURE PRP APPL 2/3 (GAUZE/BANDAGES/DRESSINGS) ×2 IMPLANT
BLADE SURG 10 STRL SS (BLADE) ×2 IMPLANT
BLADE SURG 15 STRL LF DISP TIS (BLADE) ×1 IMPLANT
BLADE SURG 15 STRL SS (BLADE) ×2
CANISTER SUCTION 2500CC (MISCELLANEOUS) ×2 IMPLANT
CHLORAPREP W/TINT 26ML (MISCELLANEOUS) ×2 IMPLANT
CLIP TI MEDIUM 6 (CLIP) ×2 IMPLANT
CLIP TI WIDE RED SMALL 6 (CLIP) ×2 IMPLANT
CONT SPEC 4OZ CLIKSEAL STRL BL (MISCELLANEOUS) ×2 IMPLANT
COVER SURGICAL LIGHT HANDLE (MISCELLANEOUS) ×2 IMPLANT
CRADLE DONUT ADULT HEAD (MISCELLANEOUS) ×2 IMPLANT
DRAPE PED LAPAROTOMY (DRAPES) ×2 IMPLANT
DRAPE UTILITY XL STRL (DRAPES) ×2 IMPLANT
ELECT CAUTERY BLADE 6.4 (BLADE) ×2 IMPLANT
ELECT REM PT RETURN 9FT ADLT (ELECTROSURGICAL) ×2
ELECTRODE REM PT RTRN 9FT ADLT (ELECTROSURGICAL) ×1 IMPLANT
GAUZE SPONGE 2X2 8PLY STRL LF (GAUZE/BANDAGES/DRESSINGS) ×1 IMPLANT
GAUZE SPONGE 4X4 16PLY XRAY LF (GAUZE/BANDAGES/DRESSINGS) ×2 IMPLANT
GLOVE BIOGEL PI IND STRL 7.0 (GLOVE) IMPLANT
GLOVE BIOGEL PI INDICATOR 7.0 (GLOVE) ×1
GLOVE SURG ORTHO 8.0 STRL STRW (GLOVE) ×2 IMPLANT
GOWN STRL REUS W/ TWL LRG LVL3 (GOWN DISPOSABLE) ×1 IMPLANT
GOWN STRL REUS W/ TWL XL LVL3 (GOWN DISPOSABLE) ×1 IMPLANT
GOWN STRL REUS W/TWL LRG LVL3 (GOWN DISPOSABLE) ×2
GOWN STRL REUS W/TWL XL LVL3 (GOWN DISPOSABLE) ×2
HEMOSTAT SURGICEL 2X4 FIBR (HEMOSTASIS) ×2 IMPLANT
KIT BASIN OR (CUSTOM PROCEDURE TRAY) ×2 IMPLANT
KIT ROOM TURNOVER OR (KITS) ×2 IMPLANT
NDL HYPO 25GX1X1/2 BEV (NEEDLE) ×1 IMPLANT
NEEDLE HYPO 25GX1X1/2 BEV (NEEDLE) ×2 IMPLANT
NS IRRIG 1000ML POUR BTL (IV SOLUTION) ×2 IMPLANT
PACK SURGICAL SETUP 50X90 (CUSTOM PROCEDURE TRAY) ×2 IMPLANT
PAD ARMBOARD 7.5X6 YLW CONV (MISCELLANEOUS) ×2 IMPLANT
PENCIL BUTTON HOLSTER BLD 10FT (ELECTRODE) ×2 IMPLANT
SPONGE GAUZE 2X2 STER 10/PKG (GAUZE/BANDAGES/DRESSINGS)
SPONGE GAUZE 4X4 12PLY STER LF (GAUZE/BANDAGES/DRESSINGS) ×1 IMPLANT
SPONGE INTESTINAL PEANUT (DISPOSABLE) IMPLANT
STRIP CLOSURE SKIN 1/2X4 (GAUZE/BANDAGES/DRESSINGS) ×2 IMPLANT
SUT MNCRL AB 4-0 PS2 18 (SUTURE) ×2 IMPLANT
SUT SILK 2 0 (SUTURE)
SUT SILK 2-0 18XBRD TIE 12 (SUTURE) IMPLANT
SUT SILK 3 0 (SUTURE)
SUT SILK 3-0 18XBRD TIE 12 (SUTURE) IMPLANT
SUT VIC AB 3-0 SH 18 (SUTURE) ×3 IMPLANT
SYR BULB 3OZ (MISCELLANEOUS) ×2 IMPLANT
SYR CONTROL 10ML LL (SYRINGE) IMPLANT
TOWEL OR 17X24 6PK STRL BLUE (TOWEL DISPOSABLE) ×2 IMPLANT
TOWEL OR 17X26 10 PK STRL BLUE (TOWEL DISPOSABLE) ×2 IMPLANT
TUBE CONNECTING 12X1/4 (SUCTIONS) ×2 IMPLANT

## 2015-07-07 NOTE — Anesthesia Procedure Notes (Signed)
Procedure Name: Intubation Date/Time: 07/07/2015 10:11 AM Performed by: Collier Bullock Pre-anesthesia Checklist: Patient identified, Emergency Drugs available, Suction available and Patient being monitored Patient Re-evaluated:Patient Re-evaluated prior to inductionOxygen Delivery Method: Circle system utilized Preoxygenation: Pre-oxygenation with 100% oxygen Intubation Type: IV induction Ventilation: Oral airway inserted - appropriate to patient size and Mask ventilation without difficulty Laryngoscope Size: Mac and 3 Grade View: Grade I Tube type: Oral Tube size: 7.0 mm Number of attempts: 1 Airway Equipment and Method: Stylet Placement Confirmation: ETT inserted through vocal cords under direct vision,  positive ETCO2 and breath sounds checked- equal and bilateral Secured at: 23 cm Dental Injury: Teeth and Oropharynx as per pre-operative assessment

## 2015-07-07 NOTE — Anesthesia Postprocedure Evaluation (Signed)
Anesthesia Post Note  Patient: Kristen Gamble  Procedure(s) Performed: Procedure(s) (LRB): PARATHYROIDECTOMY (N/A)  Patient location during evaluation: PACU Anesthesia Type: General Level of consciousness: awake and alert, oriented and patient cooperative Pain management: pain level controlled Vital Signs Assessment: post-procedure vital signs reviewed and stable Respiratory status: spontaneous breathing, nonlabored ventilation and respiratory function stable Cardiovascular status: blood pressure returned to baseline and stable Postop Assessment: no signs of nausea or vomiting Anesthetic complications: no    Last Vitals:  Filed Vitals:   07/07/15 1308 07/07/15 1320  BP:  117/69  Pulse:  66  Temp: 36.5 C   Resp:  16    Last Pain:  Filed Vitals:   07/07/15 1345  PainSc: 3                  Brownie Gockel,E. Jamielyn Petrucci

## 2015-07-07 NOTE — Transfer of Care (Signed)
Immediate Anesthesia Transfer of Care Note  Patient: Kristen Gamble  Procedure(s) Performed: Procedure(s): PARATHYROIDECTOMY (N/A)  Patient Location: PACU  Anesthesia Type:General  Level of Consciousness: awake, alert , oriented and patient cooperative  Airway & Oxygen Therapy: Patient Spontanous Breathing and Patient connected to nasal cannula oxygen  Post-op Assessment: Report given to RN, Post -op Vital signs reviewed and stable, Patient moving all extremities X 4 and Patient able to stick tongue midline  Post vital signs: Reviewed and stable  Last Vitals:  Filed Vitals:   07/07/15 0751 07/07/15 1106  BP: 94/58   Pulse: 62   Temp: 36.3 C 36.5 C  Resp: 18     Complications: No apparent anesthesia complications

## 2015-07-07 NOTE — Interval H&P Note (Signed)
History and Physical Interval Note:  07/07/2015 9:49 AM  Kristen Gamble  has presented today for surgery, with the diagnosis of PRIMARY HYPERPARATHYROIDISM.  The various methods of treatment have been discussed with the patient and family. After consideration of risks, benefits and other options for treatment, the patient has consented to    Procedure(s): PARATHYROIDECTOMY (N/A) as a surgical intervention .    The patient's history has been reviewed, patient examined, no change in status, stable for surgery.  I have reviewed the patient's chart and labs.  Questions were answered to the patient's satisfaction.    Earnstine Regal, MD, [facility] Office: [place]

## 2015-07-07 NOTE — Anesthesia Preprocedure Evaluation (Addendum)
Anesthesia Evaluation  Patient identified by MRN, date of birth, ID band Patient awake    Reviewed: Allergy & Precautions, NPO status , Patient's Chart, lab work & pertinent test results  History of Anesthesia Complications (+) PONV and history of anesthetic complications  Airway Mallampati: I  TM Distance: >3 FB Neck ROM: Full    Dental  (+) Dental Advisory Given Incisor bonded:   Pulmonary neg pulmonary ROS,    breath sounds clear to auscultation       Cardiovascular negative cardio ROS  + dysrhythmias  Rhythm:Regular Rate:Normal  7/16 stress test: no ischemia, EF 61%   Neuro/Psych ADDnegative neurological ROS     GI/Hepatic negative GI ROS, Neg liver ROS,   Endo/Other  negative endocrine ROS  Renal/GU negative Renal ROS     Musculoskeletal   Abdominal   Peds  Hematology negative hematology ROS (+)   Anesthesia Other Findings Breast cancer  Reproductive/Obstetrics                           Anesthesia Physical Anesthesia Plan  ASA: II  Anesthesia Plan: General   Post-op Pain Management:    Induction: Intravenous  Airway Management Planned: Oral ETT  Additional Equipment:   Intra-op Plan:   Post-operative Plan: Extubation in OR  Informed Consent: I have reviewed the patients History and Physical, chart, labs and discussed the procedure including the risks, benefits and alternatives for the proposed anesthesia with the patient or authorized representative who has indicated his/her understanding and acceptance.   Dental advisory given  Plan Discussed with: CRNA and Surgeon  Anesthesia Plan Comments: (Plan routine monitors, GETA)        Anesthesia Quick Evaluation

## 2015-07-07 NOTE — Op Note (Signed)
OPERATIVE REPORT - PARATHYROIDECTOMY  Preoperative diagnosis: Primary hyperparathyroidism  Postop diagnosis: Same  Procedure: Right inferior minimally invasive parathyroidectomy  Surgeon:  Earnstine Regal, MD, FACS  Anesthesia: Gen. endotracheal  Estimated blood loss: Minimal  Preparation: ChloraPrep  Indications: Patient is referred by Dr. Jacelyn Pi for evaluation of suspected primary hyperparathyroidism. Patient was noted on routine laboratory studies to have a mildly elevated serum calcium level of 10.4. Additional studies included a normal intact PTH level of 31, a normal vitamin D level of 31.7, and ionized calcium level of 5.6. Patient has had 2 episodes of nephrolithiasis requiring intervention. Stones were tested and contained calcium oxalate. Patient has a bone density scan showing osteopenia. She suffered an ankle fracture in 2015. She notes chronic fatigue. She complains of bone and joint pain. Patient does have a history of breast cancer and underwent bilateral mastectomy. She was treated with tamoxifen for 7 years. Patient denies any prior head or neck surgery. There is no family history of parathyroid disease. There is no family history of other endocrine neoplasms. Patient underwent nuclear medicine parathyroid scan on May 11, 2015. This localizes a parathyroid adenoma to the right inferior position, approximately 1-2 cm below the level of the thyroid gland.   Procedure: Patient was prepared in the holding area. He was brought to operating room and placed in a supine position on the operating room table. Following administration of general anesthesia, the patient was positioned and then prepped and draped in the usual strict aseptic fashion. After ascertaining that an adequate level of anesthesia been achieved, a neck incision was made with a #15 blade. Dissection was carried through subcutaneous tissues and platysma. Hemostasis was obtained with the  electrocautery. Skin flaps were developed circumferentially and a Weitlander retractor was placed for exposure.  Strap muscles were incised in the midline. Strap muscles were reflected exposing the thyroid lobe. With gentle blunt dissection the thyroid lobe was mobilized.  Dissection was carried through adipose tissue and an enlarged parathyroid gland was identified. It was gently mobilized. Vascular structures were divided between small and medium ligaclips. Care was taken to avoid the recurrent laryngeal nerve and the esophagus. The parathyroid gland was completely excised. It was submitted to pathology where frozen section confirmed parathyroid tissue consistent with adenoma.  Neck was irrigated with warm saline and good hemostasis was noted. Fibrillar was placed in the operative field. Strap muscles were reapproximated in the midline with interrupted 3-0 Vicryl sutures. Platysma was closed with interrupted 3-0 Vicryl sutures. Skin was closed with a running 4-0 Monocryl subcuticular suture. Marcaine was infiltrated circumferentially. Wound was washed and dried and benzoin and Steri-Strips were applied. Sterile gauze dressings were applied. Patient was awakened from anesthesia and brought to the recovery room. The patient tolerated the procedure well.   Earnstine Regal, MD, [facility]

## 2015-07-08 ENCOUNTER — Encounter (HOSPITAL_COMMUNITY): Payer: Self-pay | Admitting: Surgery

## 2015-09-22 ENCOUNTER — Telehealth: Payer: Self-pay | Admitting: Nurse Practitioner

## 2015-09-22 NOTE — Telephone Encounter (Signed)
Left Message on Pt VM regarding LTS. Gave PT time and date of appointment scheduled and left callback number. Sent out Patient Appt Packet for [facility]

## 2015-09-29 ENCOUNTER — Encounter: Payer: Self-pay | Admitting: Family Medicine

## 2015-10-03 ENCOUNTER — Ambulatory Visit (HOSPITAL_BASED_OUTPATIENT_CLINIC_OR_DEPARTMENT_OTHER): Payer: BLUE CROSS/BLUE SHIELD | Admitting: Nurse Practitioner

## 2015-10-03 ENCOUNTER — Encounter: Payer: Self-pay | Admitting: Nurse Practitioner

## 2015-10-03 ENCOUNTER — Telehealth: Payer: Self-pay | Admitting: Nurse Practitioner

## 2015-10-03 VITALS — BP 122/70 | HR 58 | Temp 97.8°F | Resp 17 | Ht 63.5 in | Wt 133.7 lb

## 2015-10-03 DIAGNOSIS — C50912 Malignant neoplasm of unspecified site of left female breast: Secondary | ICD-10-CM

## 2015-10-03 DIAGNOSIS — Z853 Personal history of malignant neoplasm of breast: Secondary | ICD-10-CM

## 2015-10-03 NOTE — Telephone Encounter (Signed)
Gave and printed appt sched and avs for pt for March 2018

## 2015-10-03 NOTE — Patient Instructions (Signed)
Thank you for coming in today!  As we discussed, please continue to perform your breast and report any changes. If you note any new symptoms (please see below), be sure to notify us ASAP.  We'll have you return in one year's time for your next appointment or sooner if you have any problems. Please be sure to stop by scheduling on your way out to make those appointment(s).  Looking forward to working with you in the future!  Let us know if you have any questions!  Symptoms to Watch for and Report to Your Provider  . Return of the cancer symptoms you had before - such as a lump or change in the area of your incision (or changes in reconstructed breast, if reconstruction completed) . New or unusual pain that seems unrelated to an injury and does not go away, including back pain or bone pain . Weight loss without trying/intending . Unexplained bleeding . A rash or allergic reaction, such as swelling, severe itching or wheezing . Chills or fevers . Persistent headaches . Shortness of breath or difficulty breathing . Bloody stools or blood in your urine . Nausea, vomiting, diarrhea, loss of appetite, or trouble swallowing . A cough that does not go away . Abdominal pain . Swelling in your arms or legs . Fractures . Hot flashes or other menopausal symptoms . Any other signs mentioned by your doctor or nurse or any unusual symptoms                 that you just can't explain   NOTE: Just because you have certain symptoms, it doesn't mean the cancer has come back or you have a new cancer. Symptoms can be due to other problems that need to be addressed.  It is important to watch for these symptoms and report them to your provider so you can be medically evaluated for any of these concerns!    Living a Life of Wellness After Cancer:  *Note: Please consult your health care provider before using any medications, supplements, over-the-counter products, or other interventions.  Also, please consult your  primary care provider before you begin any lifestyle program (diet, exercise, etc.).  Your safety is our top priority and we want to make sure you continue to live a long and healthy life!    Healthy Lifestyle Recommendations  As a cancer survivor, it is important develop a lifelong commitment to a healthy lifestyle. A healthy lifestyle can prevent cancer from returning as well as prevent other diseases like heart disease, diabetes and high blood pressure.  These are some things that you can do to have a healthy lifestyle:  Marland Kitchen Maintain a healthy weight.  . Exercise daily per your doctor's orders. . Eat a balanced diet high in fruits, vegetables, bran, and fiber. Limit intake of red meat      and processed foods.  . Limit how much alcohol you consume, if at all. Ali Lowe regular bone mineral density testing for osteoporosis.  . Talk to your doctor about cardiovascular disease or "heart disease" screening. . Stop smoking (if you smoke). . Know your family history. . Be mindful of your emotional, social, and spiritual needs. . Meet regularly with a Primary Care Provider (PCP). Find a PCP if you do not             already have one. . Talk to your doctor about regular cancer screening including screening for colon  cancer, GYN cancers, and skin cancer.

## 2015-10-03 NOTE — Progress Notes (Signed)
CLINIC:  Cancer Survivorship   REASON FOR VISIT:  Routine follow-up post-treatment for history of breast cancer.  BRIEF ONCOLOGIC HISTORY:    Breast cancer, left breast (Kristen Gamble)   01/2007 Definitive Surgery Bilateral mastectomies with subpectoral implant placement: IDC, grade 1, ER/PR+, HER2/neu negative   01/2007 Pathologic Stage Stage IA: T1c N0    01/2007 Oncotype testing RS 13 (8% ROR)   02/2007 - 01/2014 Anti-estrogen oral therapy Tamoxifen 20 mg then transitioned to letrzole with lupron then tamoxifen again (11/09) then pt discontinued 06/2010 and resumed 01/2011, ultimately discontinued 01/2014    INTERVAL HISTORY:  Kristen Gamble presents to the [facility] today for ongoing follow up regarding her history of breast cancer. Overall, Kristen Gamble reports doing very well since her last visit to see Dr. [name] in July 2016.  At that time, she was found to have an elevated calcium and workup revealed elevated hyperparathyroidism. She underwent parathyroidectomy in January 2017 and states she is feeling much better now.  Her energy has improved. She has some mild constipation, which is resolving following her surgery and her increased activity and water consumption.  She has not noticed any change within her reconstructed breasts. She denies any headache, cough, shortness of breath, or bone pain. She reports a good appetite. She states that she is attempting to lose a little of the weight that she has put on and overall feels great.  REVIEW OF SYSTEMS:  General: Occasional hot flashes.  Denies fever, chills, unintentional weight loss, or generalized fatigue.  HEENT: Wears glasses and contacts. Denies visual changes, hearing loss, mouth sores, or difficulty swallowing. Cardiac: Denies palpitations and lower extremity edema.  Respiratory: Denies wheeze or dyspnea on exertion.  Breast: Denies any new nodularity, masses, tenderness, nipple changes, or nipple discharge.  GI: Mild constipation, as  above.  Denies abdominal pain, diarrhea, nausea, or vomiting.  GU: Mild urinary incontinence, unchanged. Denies dysuria, hematuria, vaginal bleeding, vaginal discharge, or vaginal dryness.  Musculoskeletal: Arthritis, as above. Otherwise, denies joint or bone pain.  Neuro: Denies recent fall or numbness / tingling in her extremities.  Skin: Denies rash, pruritis, or open wounds.  Psych: Denies depression, anxiety, insomnia, or memory loss.   A 14-point review of systems was completed and was negative, except as noted above.   ONCOLOGY TREATMENT TEAM:  1. Surgeon:  Dr. Stasia Cavalier at [facility] 2. Medical Oncologist: Dr. [name]     PAST MEDICAL/SURGICAL HISTORY:  Past Medical History  Diagnosis Date  . Breast cancer ([place]) 10/2006    left  . Osteopenia 05/2013    T score minus 1.7  . ADHD (attention deficit hyperactivity disorder) 08/2011  . TMJ (temporomandibular joint disorder)   . Arthritis   . Complication of anesthesia     nausea  . Numbness     under l arm  . History of kidney stones   . Hematuria   . Nephrolithiasis     and stone distal R ureter on CT and R hydronephrosis  . PONV (postoperative nausea and vomiting)   . Constipation    Past Surgical History  Procedure Laterality Date  . Breast surgery  2008    Lumpectomy-Bilateral mastectomy-implants  . Augmentation mammaplasty  1999  . Total abdominal hysterectomy w/ bilateral salpingoophorectomy  9/08  . Tonsillectomy  1971  . Breast biopsy      x4 in 2009-2012, all benign  . Pelvic laparoscopy      LAP HYST BSO  . Laparoscopic total hysterectomy    .  Gynecologic cryosurgery  age 12  . Foot surgery  2014    Right big toe  . Colposcopy    . Cystoscopy    . Cystoscopy/retrograde/ureteroscopy/stone extraction with basket Right 11/13/2013    Procedure: CYSTOSCOPY, RIGHT RETROGRADE PYLOGRAM WITH INTERPRETATION, RIGHT URETEROSCOPY WITH DILATION OF URETERAL STRICTURE WITH BASKET EXTRACTION OF STONE;  Surgeon: Ailene Rud, MD;  Location: [facility];  Service: Urology;  Laterality: Right;  . Mastectomy Bilateral 01-2007  . Cystoscopy with retrograde pyelogram, ureteroscopy and stent placement Left 11/05/2014    Procedure: CYSTOSCOPY WITH RETROGRADE PYELOGRAM, URETEROSCOPY AND STENT PLACEMENT;  Surgeon: Franchot Gallo, MD;  Location: [facility];  Service: Urology;  Laterality: Left;  . Stone extraction with basket Left 11/05/2014    Procedure: STONE EXTRACTION WITH BASKET;  Surgeon: Franchot Gallo, MD;  Location: [facility];  Service: Urology;  Laterality: Left;  . Abdominal hysterectomy    . Parathyroidectomy N/A 07/07/2015    Procedure: PARATHYROIDECTOMY;  Surgeon: Armandina Gemma, MD;  Location: [place];  Service: General;  Laterality: N/A;     ALLERGIES:  Allergies  Allergen Reactions  . Morphine     REACTION: itching     CURRENT MEDICATIONS:  Current Outpatient Prescriptions on File Prior to Visit  Medication Sig Dispense Refill  . fexofenadine (ALLEGRA) 180 MG tablet Take 180 mg by mouth daily as needed for allergies.     . fluticasone (FLONASE) 50 MCG/ACT nasal spray Place 2 sprays into the nose daily. (Patient taking differently: Place 2 sprays into the nose daily as needed for allergies. ) 16 g 8  . HYDROcodone-acetaminophen (NORCO/VICODIN) 5-325 MG tablet Take 1-2 tablets by mouth every 4 (four) hours as needed for moderate pain. 20 tablet 0  . LINZESS 145 MCG CAPS capsule Take 145 mg by mouth daily.  3   No current facility-administered medications on file prior to visit.     ONCOLOGIC FAMILY HISTORY:  Family History  Problem Relation Age of Onset  . Diabetes Father   . Cancer Father     stomach  . Alcohol abuse Father   . Depression Father   . Ulcers Father   . Diabetes Maternal Grandmother   . Hypertension Maternal Grandmother   . Heart attack Maternal Grandmother   . Heart disease Maternal Grandmother   . Hypothyroidism Maternal Grandmother     . Hypertension Maternal Grandfather   . Stroke Maternal Grandfather   . Asthma Maternal Grandfather   . Colon polyps Maternal Grandfather   . Heart attack Paternal Grandfather   . Heart disease Paternal Grandfather 84    died of MI  . Arthritis Mother   . Hypothyroidism Mother   . Diabetes Brother      GENETIC COUNSELING/TESTING: No   SOCIAL HISTORY:  Kristen Gamble is married and lives with her family in [place], [place].  She has 2 children, including one chiild that died at age 30.5 from viral myocarditis. 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  She denies any current or history of tobacco or illicit drug use.  She drinks 1 glass of wine 4-5 / week.  HEALTH MAINTENANCE: Colonoscopy: 2015 (recommended follow up in 3 years) PAP: S/P hysterectomy Bone density: 2014  PHYSICAL EXAMINATION:  Vital Signs: Filed Vitals:   10/03/15 1456  BP: 122/70  Pulse: 58  Temp: 97.8 F (36.6 C)  Resp: 17   Weight: 133.7(stable) ECOG performance status: 0 General: Well-nourished, well-appearing female in no acute  distress.  She is unaccompanied in clinic today.   HEENT: Head is atraumatic and normocephalic.  Pupils equal and reactive to light and accomodation. Conjunctivae clear without exudate.  Sclerae anicteric. Oral mucosa is pink, moist, and intact without lesions.  Oropharynx is pink without lesions or erythema.  Lymph: No cervical, supraclavicular, infraclavicular, or axillary lymphadenopathy noted on palpation.  Cardiovascular: Regular rate and rhythm without murmurs, rubs, or gallops. Respiratory: Clear to auscultation bilaterally. Chest expansion symmetric without accessory muscle use on inspiration or expiration.  Breast: Bilateral reconstructed breasts without mass or nodularity.  GI: Abdomen soft and round. No tenderness to palpation. Bowel sounds normoactive in 4 quadrants. No hepatosplenomegaly.   GU: Deferred.  Musculoskeletal: Muscle  strength 5/5 in all extremities.   Neuro: No focal deficits. Steady gait.  Psych: Mood and affect normal and appropriate for situation.  Extremities: No edema, cyanosis, or clubbing.  Skin: Warm and dry. No open lesions noted.     ASSESSMENT AND PLAN:   1. History of breast cancer: Stage IA invasive ductal carcinoma of the left breast (12/2006), ER positive, PR positive, HER2/neu negative, S/P bilateral mastectomies with reconstruction (01/2007) with benign findings on the right breast, followed by 7 years of anti-estrogen therapy with tamoxifen / letrozole / lupron now followed in a program of surveillance.  Ms. Minney is doing well with no clinical symptoms worrisome for cancer recurrence at this time. I have reviewed the recommendations for ongoing surveillance with her and she will return to see me in the Survivorship clinic in one year's time with history and physical exam per surveillance protocol.  She was instructed to make [place] aware if she notes any change within her breast, any new symptoms such as pain, shortness of breath, weight loss, or fatigue.    2. Cancer screening:  Due to Ms. Anastasia's history and her age, she should receive screening for skin cancers and colon cancer. She is S/P hysterectomy.  As above, Ms. Sara is due for repeat colonoscopy, which she will schedule later this year. The information and recommendations were shared with the patient and in her written after visit summary.  3. Health maintenance and wellness promotion:Ms. Truman Hayward and I discussed recommendations to maximize nutrition and minimize recurrence, such as increased intake of fruits, vegetables, lean proteins, and minimizing the intake of red meats and processed foods.  She was also encouraged to continue to engage in moderate to vigorous exercise for 30 minutes per day most days of the week. She was instructed to limit her alcohol consumption and continue to abstain from tobacco use.   A total of 30 minutes of face-to-face  time was spent with this patient with greater than 50% of that time in counseling and care-coordination.   Sylvan Cheese, NP  Survivorship Program [place] [phone]   Note: PRIMARY CARE PROVIDER Vikki Ports, [place] [phone]

## 2016-04-24 LAB — HM COLONOSCOPY

## 2016-04-25 ENCOUNTER — Emergency Department (HOSPITAL_BASED_OUTPATIENT_CLINIC_OR_DEPARTMENT_OTHER): Payer: BLUE CROSS/BLUE SHIELD

## 2016-04-25 ENCOUNTER — Encounter (HOSPITAL_BASED_OUTPATIENT_CLINIC_OR_DEPARTMENT_OTHER): Payer: Self-pay | Admitting: *Deleted

## 2016-04-25 ENCOUNTER — Emergency Department (HOSPITAL_BASED_OUTPATIENT_CLINIC_OR_DEPARTMENT_OTHER)
Admission: EM | Admit: 2016-04-25 | Discharge: 2016-04-25 | Disposition: A | Discharge status: Home / Self Care | Payer: BLUE CROSS/BLUE SHIELD | Attending: Emergency Medicine | Admitting: Emergency Medicine

## 2016-04-25 DIAGNOSIS — R0781 Pleurodynia: Secondary | ICD-10-CM | POA: Insufficient documentation

## 2016-04-25 DIAGNOSIS — F909 Attention-deficit hyperactivity disorder, unspecified type: Secondary | ICD-10-CM | POA: Insufficient documentation

## 2016-04-25 DIAGNOSIS — Z79899 Other long term (current) drug therapy: Secondary | ICD-10-CM | POA: Diagnosis not present

## 2016-04-25 DIAGNOSIS — Z853 Personal history of malignant neoplasm of breast: Secondary | ICD-10-CM | POA: Diagnosis not present

## 2016-04-25 DIAGNOSIS — R1012 Left upper quadrant pain: Secondary | ICD-10-CM

## 2016-04-25 DIAGNOSIS — R079 Chest pain, unspecified: Secondary | ICD-10-CM

## 2016-04-25 LAB — CBC WITH DIFFERENTIAL/PLATELET
Basophils Absolute: 0 10*3/uL (ref 0.0–0.1)
Basophils Relative: 0 %
EOS PCT: 1 %
Eosinophils Absolute: 0 10*3/uL (ref 0.0–0.7)
HCT: 39.1 % (ref 36.0–46.0)
Hemoglobin: 13.7 g/dL (ref 12.0–15.0)
LYMPHS ABS: 1.7 10*3/uL (ref 0.7–4.0)
Lymphocytes Relative: 37 %
MCH: 33.1 pg (ref 26.0–34.0)
MCHC: 35 g/dL (ref 30.0–36.0)
MCV: 94.4 fL (ref 78.0–100.0)
MONO ABS: 0.3 10*3/uL (ref 0.1–1.0)
Monocytes Relative: 7 %
Neutro Abs: 2.5 10*3/uL (ref 1.7–7.7)
Neutrophils Relative %: 55 %
PLATELETS: 243 10*3/uL (ref 150–400)
RBC: 4.14 MIL/uL (ref 3.87–5.11)
RDW: 12.6 % (ref 11.5–15.5)
WBC: 4.7 10*3/uL (ref 4.0–10.5)

## 2016-04-25 LAB — COMPREHENSIVE METABOLIC PANEL
ALT: 15 U/L (ref 14–54)
ANION GAP: 7 (ref 5–15)
AST: 22 U/L (ref 15–41)
Albumin: 4.1 g/dL (ref 3.5–5.0)
Alkaline Phosphatase: 56 U/L (ref 38–126)
BUN: 8 mg/dL (ref 6–20)
CHLORIDE: 108 mmol/L (ref 101–111)
CO2: 26 mmol/L (ref 22–32)
Calcium: 9.2 mg/dL (ref 8.9–10.3)
Creatinine, Ser: 0.51 mg/dL (ref 0.44–1.00)
GFR calc non Af Amer: >60 mL/min (ref >60)
Glucose, Bld: 99 mg/dL (ref 65–99)
POTASSIUM: 3.6 mmol/L (ref 3.5–5.1)
SODIUM: 141 mmol/L (ref 135–145)
Total Bilirubin: 0.5 mg/dL (ref 0.3–1.2)
Total Protein: 6.7 g/dL (ref 6.5–8.1)

## 2016-04-25 LAB — TROPONIN I: Troponin I: <0.03 ng/mL (ref <0.03)

## 2016-04-25 LAB — LIPASE, BLOOD: LIPASE: 22 U/L (ref 11–51)

## 2016-04-25 MED ORDER — IOPAMIDOL (ISOVUE-300) INJECTION 61%
100.0000 mL | Freq: Once | INTRAVENOUS | Status: AC | PRN
  Administered 2016-04-25: 100 mL via INTRAVENOUS

## 2016-04-25 NOTE — ED Triage Notes (Signed)
Pt reports having colonoscopy yesterday at 4pm, pt noticed pain to her ribcage area on left side which has increased overnight, now 4/10, increases with deep inspiration and certain movements/positions.

## 2016-04-25 NOTE — ED Provider Notes (Signed)
[facility] Provider Note   CSN: [id number] Arrival date & time: 04/25/16  1003     History   Chief Complaint Chief Complaint  Patient presents with  . Chest Pain    HPI Kristen Gamble is a 55 y.o. female.  55 year old female who had a colonoscopy yesterday and progressively worsening pleuritic left-sided pain since that time. Normal bowel movements, no nausea, no vomiting no rash, no fever, no shortness of breath, no cough, no blood in her stool. No other associated symptoms. No other modifying factors. Dr. [name] had called me on the phone prior to the patient's arrival and discussed the concern for possible splenic injury with these procedures.      Past Medical History:  Diagnosis Date  . ADHD (attention deficit hyperactivity disorder) 08/2011  . Arthritis   . Breast cancer ([place]) 10/2006   left  . Complication of anesthesia    nausea  . Constipation   . Hematuria   . History of kidney stones   . Nephrolithiasis    and stone distal R ureter on CT and R hydronephrosis  . Numbness    under l arm  . Osteopenia 05/2013   T score minus 1.7  . PONV (postoperative nausea and vomiting)   . TMJ (temporomandibular joint disorder)     Patient Active Problem List   Diagnosis Date Noted  . Hyperparathyroidism, primary ([place]) 07/05/2015  . Breast cancer, left breast ([place]) 02/09/2015  . TMJ (temporomandibular joint disorder)   . ADHD (attention deficit hyperactivity disorder) 10/22/2011  . TMJ disease 10/22/2011  . Allergic rhinitis, cause unspecified 09/17/2011  . Hot flashes 09/17/2011  . Cancer ([place])   . AV BLOCK, 1ST DEGREE 03/17/2010  . CHEST PAIN-UNSPECIFIED 03/17/2010  . ARTHRITIS, HAND 03/10/2010  . PALPITATIONS 03/10/2010  . MASTECTOMY, BILATERAL, HX OF 03/10/2010    Past Surgical History:  Procedure Laterality Date  . ABDOMINAL HYSTERECTOMY    . AUGMENTATION MAMMAPLASTY  1999  . BREAST BIOPSY     x4 in 2009-2012, all benign  . BREAST SURGERY   2008   Lumpectomy-Bilateral mastectomy-implants  . COLPOSCOPY    . CYSTOSCOPY    . CYSTOSCOPY WITH RETROGRADE PYELOGRAM, URETEROSCOPY AND STENT PLACEMENT Left 11/05/2014   Procedure: CYSTOSCOPY WITH RETROGRADE PYELOGRAM, URETEROSCOPY AND STENT PLACEMENT;  Surgeon: Franchot Gallo, MD;  Location: [facility];  Service: Urology;  Laterality: Left;  . CYSTOSCOPY/RETROGRADE/URETEROSCOPY/STONE EXTRACTION WITH BASKET Right 11/13/2013   Procedure: CYSTOSCOPY, RIGHT RETROGRADE PYLOGRAM WITH INTERPRETATION, RIGHT URETEROSCOPY WITH DILATION OF URETERAL STRICTURE WITH BASKET EXTRACTION OF STONE;  Surgeon: Ailene Rud, MD;  Location: [facility];  Service: Urology;  Laterality: Right;  . FOOT SURGERY  2014   Right big toe  . GYNECOLOGIC CRYOSURGERY  age 85  . LAPAROSCOPIC TOTAL HYSTERECTOMY    . MASTECTOMY Bilateral 01-2007  . PARATHYROIDECTOMY N/A 07/07/2015   Procedure: PARATHYROIDECTOMY;  Surgeon: Armandina Gemma, MD;  Location: [facility];  Service: General;  Laterality: N/A;  . PELVIC LAPAROSCOPY     LAP HYST BSO  . STONE EXTRACTION WITH BASKET Left 11/05/2014   Procedure: STONE EXTRACTION WITH BASKET;  Surgeon: Franchot Gallo, MD;  Location[facility];  Service: Urology;  Laterality: Left;  . TONSILLECTOMY  1971  . TOTAL ABDOMINAL HYSTERECTOMY W/ BILATERAL SALPINGOOPHORECTOMY  9/08    OB History    Gravida Para Term Preterm AB Living   2 2 2     1    SAB TAB Ectopic Multiple Live Births  Home Medications    Prior to Admission medications   Medication Sig Start Date End Date Taking? Authorizing Provider  L-Methylfolate-Algae (DEPLIN 15 PO) Take by mouth.   Yes Historical Provider, MD  fexofenadine (ALLEGRA) 180 MG tablet Take 180 mg by mouth daily as needed for allergies.     Historical Provider, MD  fluticasone (FLONASE) 50 MCG/ACT nasal spray Place 2 sprays into the nose daily. Patient taking differently: Place 2 sprays into the nose daily  as needed for allergies.  11/06/12   Rita Ohara, MD  HYDROcodone-acetaminophen (NORCO/VICODIN) 5-325 MG tablet Take 1-2 tablets by mouth every 4 (four) hours as needed for moderate pain. 07/07/15   Armandina Gemma, MD  polyethylene glycol [facility] / Floria Raveling) packet Take 17 g by mouth daily as needed.    Historical Provider, MD    Family History Family History  Problem Relation Age of Onset  . Diabetes Father   . Cancer Father     stomach  . Alcohol abuse Father   . Depression Father   . Ulcers Father   . Diabetes Maternal Grandmother   . Hypertension Maternal Grandmother   . Heart attack Maternal Grandmother   . Heart disease Maternal Grandmother   . Hypothyroidism Maternal Grandmother   . Hypertension Maternal Grandfather   . Stroke Maternal Grandfather   . Asthma Maternal Grandfather   . Colon polyps Maternal Grandfather   . Heart attack Paternal Grandfather   . Heart disease Paternal Grandfather 60    died of MI  . Arthritis Mother   . Hypothyroidism Mother   . Diabetes Brother     Social History Social History  Substance Use Topics  . Smoking status: Never Smoker  . Smokeless tobacco: Never Used  . Alcohol use 2.5 oz/week    5 Standard drinks or equivalent per week     Comment: 1 glass of wine 4-5 times week.     Allergies   Morphine   Review of Systems Review of Systems  All other systems reviewed and are negative.    Physical Exam Updated Vital Signs BP 118/75 (BP Location: Right Arm)   Pulse (!) 56   Temp 98.1 F (36.7 C) (Oral)   Resp 18   Ht 5\' 3"  (1.6 m)   Wt 124 lb (56.2 kg)   SpO2 100%   BMI 21.97 kg/m   Physical Exam  Constitutional: She appears well-developed and well-nourished. No distress.  HENT:  Head: Normocephalic and atraumatic.  Eyes: Conjunctivae are normal.  Neck: Neck supple.  Cardiovascular: Normal rate and regular rhythm.   No murmur heard. Pulmonary/Chest: Effort normal and breath sounds normal. No respiratory distress.    Abdominal: Soft. She exhibits no mass. There is no tenderness.  Musculoskeletal: She exhibits no edema or deformity.  Neurological: She is alert.  Skin: Skin is warm and dry. No erythema.  Psychiatric: She has a normal mood and affect.  Nursing note and vitals reviewed.    ED Treatments / Results  Labs (all labs ordered are listed, but only abnormal results are displayed) Labs Reviewed  CBC WITH DIFFERENTIAL/PLATELET  COMPREHENSIVE METABOLIC PANEL  LIPASE, BLOOD  TROPONIN I    EKG  EKG Interpretation  Date/Time:  Wednesday April 25 2016 10:23:18 EDT Ventricular Rate:  61 PR Interval:    QRS Duration: 84 QT Interval:  422 QTC Calculation: 426 R Axis:   89 Text Interpretation:  Sinus rhythm Minimal ST depression, lateral leads similar ST changes to previous Confirmed by [facility] MD,  Shaquera Ansley [phone]) on 04/25/2016 10:34:00 AM       Radiology Dg Chest 2 View  Result Date: 04/25/2016 CLINICAL DATA:  Patient states that she had a colonoscopy yesterday afternoon @ 4:30pm and once she got home started having LUQ/left mid and lower axillary ribs pains, hurts more when she takes a deep breath in, hx of hysterectomy, left breast cancer, kidney stones. EXAM: CHEST  2 VIEW COMPARISON:  None. FINDINGS: The heart, mediastinum and hila are unremarkable. Clear lungs.  No pleural effusion or pneumothorax. Left axillary region vascular clips are consistent with previous breast surgery. Skeletal structures are unremarkable. No free air underneath the hemidiaphragms. IMPRESSION: No active cardiopulmonary disease. Electronically Signed   By: Lajean Manes M.D.   On: 04/25/2016 12:32   Ct Abdomen Pelvis W Contrast  Result Date: 04/25/2016 CLINICAL DATA:  Evaluate for splenic injury. Left upper quadrant pain after colonoscopy. EXAM: CT ABDOMEN AND PELVIS WITH CONTRAST TECHNIQUE: Multidetector CT imaging of the abdomen and pelvis was performed using the standard protocol following bolus  administration of intravenous contrast. CONTRAST:  124mL ISOVUE-300 IOPAMIDOL (ISOVUE-300) INJECTION 61% COMPARISON:  CT 11/03/2014 FINDINGS: Lower chest: Lung bases are clear. No effusions. Heart is normal size. Hepatobiliary: No focal hepatic abnormality. Gallbladder unremarkable. Pancreas: No focal abnormality or ductal dilatation. Spleen: No focal abnormality. Normal size. No evidence of splenic injury. Adrenals/Urinary Tract: No adrenal abnormality. No focal renal abnormality. No stones or hydronephrosis. Urinary bladder is unremarkable. Stomach/Bowel: Appendix is visualized and is normal. Moderate stool in the right colon. Stomach, large and small bowel grossly unremarkable. Vascular/Lymphatic: No evidence of aneurysm or adenopathy. Reproductive: Prior hysterectomy.  No adnexal masses. Other: No free fluid or free air. Musculoskeletal: No acute bony abnormality or focal bone lesion. IMPRESSION: No acute findings. No evidence of splenic injury or other complicating process from prior colonoscopy. Electronically Signed   By: Rolm Baptise M.D.   On: 04/25/2016 12:32    Procedures Procedures (including critical care time)  Medications Ordered in ED Medications  iopamidol (ISOVUE-300) 61 % injection 100 mL (100 mLs Intravenous Contrast Given 04/25/16 1209)     Initial Impression / Assessment and Plan / ED Course  I have reviewed the triage vital signs and the nursing notes.  Pertinent labs & imaging results that were available during my care of the patient were reviewed by me and considered in my medical decision making (see chart for details).  Clinical Course   CTA to evaluate for intra-abdominal couple occasions from her colonoscopy. We'll also get a troponin, chest x-ray, EKG to evaluate for possible causes of pleuritic chest pain such as pneumothorax and ACS. Low likelihood for pulmonary embolus without any risk factors. rediscussed with Dr. [name] regarding negative results, no further  recommendations. D/w patient, stable VS and pain control, plan for dc with PCP follow up. Strict return precautions for new/worsening symptoms.    Final Clinical Impressions(s) / ED Diagnoses   Final diagnoses:  Chest pain, unspecified type  Left upper quadrant pain    New Prescriptions New Prescriptions   No medications on file     Merrily Pew, MD 04/25/16 1351

## 2016-04-25 NOTE — ED Notes (Signed)
Patient transported to CT 

## 2016-05-16 ENCOUNTER — Encounter: Payer: Self-pay | Admitting: *Deleted

## 2016-06-27 DIAGNOSIS — M2022 Hallux rigidus, left foot: Secondary | ICD-10-CM | POA: Diagnosis not present

## 2016-07-25 DIAGNOSIS — M2022 Hallux rigidus, left foot: Secondary | ICD-10-CM | POA: Diagnosis not present

## 2016-08-13 DIAGNOSIS — F321 Major depressive disorder, single episode, moderate: Secondary | ICD-10-CM | POA: Diagnosis not present

## 2016-09-26 ENCOUNTER — Encounter: Payer: BLUE CROSS/BLUE SHIELD | Admitting: Nurse Practitioner

## 2016-09-27 ENCOUNTER — Encounter: Payer: Self-pay | Admitting: *Deleted

## 2016-09-27 ENCOUNTER — Ambulatory Visit (HOSPITAL_BASED_OUTPATIENT_CLINIC_OR_DEPARTMENT_OTHER): Payer: BLUE CROSS/BLUE SHIELD | Admitting: Adult Health

## 2016-09-27 ENCOUNTER — Encounter: Payer: Self-pay | Admitting: Adult Health

## 2016-09-27 VITALS — BP 99/53 | HR 65 | Temp 97.8°F | Resp 18 | Wt 135.0 lb

## 2016-09-27 DIAGNOSIS — R590 Localized enlarged lymph nodes: Secondary | ICD-10-CM

## 2016-09-27 DIAGNOSIS — C50912 Malignant neoplasm of unspecified site of left female breast: Secondary | ICD-10-CM

## 2016-09-27 DIAGNOSIS — Z853 Personal history of malignant neoplasm of breast: Secondary | ICD-10-CM | POA: Diagnosis not present

## 2016-09-27 DIAGNOSIS — F321 Major depressive disorder, single episode, moderate: Secondary | ICD-10-CM | POA: Diagnosis not present

## 2016-09-27 DIAGNOSIS — Z170 Estrogen receptor positive status [ER+]: Secondary | ICD-10-CM

## 2016-09-27 NOTE — Progress Notes (Signed)
CLINIC:  Survivorship   REASON FOR VISIT:  Routine follow-up for history of breast cancer.   BRIEF ONCOLOGIC HISTORY:    Breast cancer, left breast ([place])   01/2007 Definitive Surgery    Bilateral mastectomies with subpectoral implant placement: IDC, grade 1, ER/PR+, HER2/neu negative      01/2007 Pathologic Stage    Stage IA: T1c N0       01/2007 Oncotype testing    RS 13 (8% ROR)      02/2007 - 01/2014 Anti-estrogen oral therapy    Tamoxifen 20 mg then transitioned to letrzole with lupron then tamoxifen again (11/09) then pt discontinued 06/2010 and resumed 01/2011, ultimately discontinued 01/2014        INTERVAL HISTORY:  Kristen Gamble presents to the [facility] today for routine follow-up for her history of breast cancer.  Overall, she reports feeling quite well. She is not sleeping well and has depression and anxiety that her PCP is following.  She recently underwent bunion fusion and has just been able to start back with exercising    REVIEW OF SYSTEMS:  Review of Systems  Constitutional: Negative for chills, fever, malaise/fatigue and weight loss.  HENT: Negative for hearing loss and tinnitus.   Eyes: Negative for blurred vision and double vision.  Respiratory: Negative for cough and shortness of breath.   Cardiovascular: Negative for chest pain, palpitations and leg swelling.  Gastrointestinal: Negative for abdominal pain, blood in stool, constipation, diarrhea, heartburn, melena, nausea and vomiting.  Genitourinary: Negative for dysuria.  Skin: Negative for rash.  Neurological: Negative for dizziness, tingling and headaches.  Endo/Heme/Allergies: Negative for environmental allergies. Does not bruise/bleed easily.  Psychiatric/Behavioral: Negative for depression. The patient is not nervous/anxious.   Breast: Denies any new nodularity, masses, tenderness, nipple changes, or nipple discharge.        PAST MEDICAL/SURGICAL HISTORY:  Past Medical History:    Diagnosis Date  . ADHD (attention deficit hyperactivity disorder) 08/2011  . Arthritis   . Breast cancer ([place]) 10/2006   left  . Complication of anesthesia    nausea  . Constipation   . Hematuria   . History of kidney stones   . Nephrolithiasis    and stone distal R ureter on CT and R hydronephrosis  . Numbness    under l arm  . Osteopenia 05/2013   T score minus 1.7  . PONV (postoperative nausea and vomiting)   . TMJ (temporomandibular joint disorder)    Past Surgical History:  Procedure Laterality Date  . ABDOMINAL HYSTERECTOMY    . AUGMENTATION MAMMAPLASTY  1999  . BREAST BIOPSY     x4 in 2009-2012, all benign  . BREAST SURGERY  2008   Lumpectomy-Bilateral mastectomy-implants  . COLPOSCOPY    . CYSTOSCOPY    . CYSTOSCOPY WITH RETROGRADE PYELOGRAM, URETEROSCOPY AND STENT PLACEMENT Left 11/05/2014   Procedure: CYSTOSCOPY WITH RETROGRADE PYELOGRAM, URETEROSCOPY AND STENT PLACEMENT;  Surgeon: Franchot Gallo, MD;  Location: [facility];  Service: Urology;  Laterality: Left;  . CYSTOSCOPY/RETROGRADE/URETEROSCOPY/STONE EXTRACTION WITH BASKET Right 11/13/2013   Procedure: CYSTOSCOPY, RIGHT RETROGRADE PYLOGRAM WITH INTERPRETATION, RIGHT URETEROSCOPY WITH DILATION OF URETERAL STRICTURE WITH BASKET EXTRACTION OF STONE;  Surgeon: Ailene Rud, MD;  Location: [facility];  Service: Urology;  Laterality: Right;  . FOOT SURGERY  2014   Right big toe  . GYNECOLOGIC CRYOSURGERY  age 48  . LAPAROSCOPIC TOTAL HYSTERECTOMY    . MASTECTOMY Bilateral 01-2007  . PARATHYROIDECTOMY N/A 07/07/2015   Procedure:  PARATHYROIDECTOMY;  Surgeon: Armandina Gemma, MD;  Location: [facility];  Service: General;  Laterality: N/A;  . PELVIC LAPAROSCOPY     LAP HYST BSO  . STONE EXTRACTION WITH BASKET Left 11/05/2014   Procedure: STONE EXTRACTION WITH BASKET;  Surgeon: Franchot Gallo, MD;  Location: [facility];  Service: Urology;  Laterality: Left;  . TONSILLECTOMY  1971  . TOTAL  ABDOMINAL HYSTERECTOMY W/ BILATERAL SALPINGOOPHORECTOMY  9/08     ALLERGIES:  Allergies  Allergen Reactions  . Morphine Itching    REACTION: itching REACTION: itching     CURRENT MEDICATIONS:  Outpatient Encounter Prescriptions as of 09/27/2016  Medication Sig  . fluticasone (FLONASE) 50 MCG/ACT nasal spray Place 2 sprays into the nose daily. (Patient taking differently: Place 2 sprays into the nose daily as needed for allergies. )  . fluticasone (FLONASE) 50 MCG/ACT nasal spray Place into the nose.  Marland Kitchen L-Methylfolate-Algae (DEPLIN 15 PO) Take by mouth.  . loratadine (CLARITIN) 10 MG tablet Take 10 mg by mouth daily.  . polyethylene glycol (MIRALAX / GLYCOLAX) packet Take 17 g by mouth daily as needed.  . traZODone (DESYREL) 150 MG tablet Take 150 mg by mouth at bedtime.  . vitamin B-12 (CYANOCOBALAMIN) 100 MCG tablet Take 100 mcg by mouth daily.  . fexofenadine (ALLEGRA) 180 MG tablet Take 180 mg by mouth daily as needed for allergies.   Marland Kitchen HYDROcodone-acetaminophen (NORCO/VICODIN) 5-325 MG tablet Take 1-2 tablets by mouth every 4 (four) hours as needed for moderate pain. (Patient not taking: Reported on 09/27/2016)  . polyethylene glycol (MIRALAX / GLYCOLAX) packet Take by mouth.  . traZODone (DESYREL) 100 MG tablet Take 150 mg by mouth.   No facility-administered encounter medications on file as of 09/27/2016.      ONCOLOGIC FAMILY HISTORY:  Family History  Problem Relation Age of Onset  . Diabetes Father   . Cancer Father     stomach  . Alcohol abuse Father   . Depression Father   . Ulcers Father   . Diabetes Maternal Grandmother   . Hypertension Maternal Grandmother   . Heart attack Maternal Grandmother   . Heart disease Maternal Grandmother   . Hypothyroidism Maternal Grandmother   . Hypertension Maternal Grandfather   . Stroke Maternal Grandfather   . Asthma Maternal Grandfather   . Colon polyps Maternal Grandfather   . Heart attack Paternal Grandfather   . Heart  disease Paternal Grandfather 74    died of MI  . Arthritis Mother   . Hypothyroidism Mother   . Diabetes Brother      SOCIAL HISTORY:  Kristen Gamble is married and lives with her husband in [place], [place].  She has 4 children, her twins are away at boarding school, she has one child in college and one who is married.  She denies any current or history of tobacco, alcohol, or illicit drug use.     PHYSICAL EXAMINATION:  Vital Signs: Vitals:   09/27/16 1313  BP: (!) 99/53  Pulse: 65  Resp: 18  Temp: 97.8 F (36.6 C)   Filed Weights   09/27/16 1313  Weight: 135 lb (61.2 kg)   General: Well-nourished, well-appearing female in no acute distress.  Unaccompanied/ today.   HEENT: Head is normocephalic.  Pupils equal and reactive to light. Conjunctivae clear without exudate.  Sclerae anicteric. Oral mucosa is pink, moist.  Oropharynx is pink without lesions or erythema.  Lymph: No cervical, supraclavicular, or infraclavicular lymphadenopathy noted on palpation.  Cardiovascular: Regular rate  and rhythm.Marland Kitchen Respiratory: Clear to auscultation bilaterally. Chest expansion symmetric; breathing non-labored.  Breast Exam:  s/p bilateral mastectomies with implant placement.  NO skin thickness, nodularity, swelling, redness, masses. Benign bilateral breast exam. -Axilla: small 27m right axillary adneopathy palpated.  No left axillary adenopathy GI: Abdomen soft and round; non-tender, non-distended. Bowel sounds normoactive. No hepatosplenomegaly.   GU: Deferred.  Neuro: No focal deficits. Steady gait.  Psych: Mood and affect normal and appropriate for situation.  Extremities: No edema. Skin: Warm and dry.  LABORATORY DATA:  None for this visit   DIAGNOSTIC IMAGING:  n/a    ASSESSMENT AND PLAN:  Ms.. LCegielskiis a pleasant 56y.o. female with history of Stage IA left breast invasive ductal carcinoma, ER+/PR+/HER2-, diagnosed in 2008, treated with bilateral mastectomies followed by Anti estrogen  therapy with Tamoxifen and Letrozole until 2015.  [facility] for surveillance and routine follow-up.   1. History of breast cancer:  Ms. LLannanis currently clinically and radiographically without evidence of disease or recurrence of breast cancer. Due to ? Lymph node in right axilla we will do ultrasound at GI-the breast center.  .  I encouraged her to call me with any questions or concerns before her next visit at the cancer center, and I would be happy to see her sooner, if needed.    2. Cancer screening:  Due to Ms. Soden's history and her age, she should receive screening for skin cancers, colon cancer, and gynecologic cancers. She was encouraged to follow-up with her PCP for appropriate cancer screenings.   3. Health maintenance and wellness promotion: Ms. LCorlisswas encouraged to consume 5-7 servings of fruits and vegetables per day. She was also encouraged to engage in moderate to vigorous exercise for 30 minutes per day most days of the week. She was instructed to limit her alcohol consumption and continue to abstain from tobacco use.    Dispo:  -Return to cancer center in one year for LTS visit   A total of (30) minutes of face-to-face time was spent with this patient with greater than 50% of that time in counseling and care-coordination.   LGardenia Phlegm [phone]  Note: PRIMARY CARE PROVIDER KVikki Ports MM[phone]

## 2016-09-27 NOTE — Progress Notes (Signed)
err

## 2016-09-27 NOTE — Progress Notes (Unsigned)
Called Kristen Gamble, scheduled an appointment for patient on Monday, October 01, 2016 at 10:30 am for a (R) axilla ultrasound. Patient given information for appointment while in office.

## 2016-10-01 ENCOUNTER — Ambulatory Visit
Admission: RE | Admit: 2016-10-01 | Discharge: 2016-10-01 | Disposition: A | Discharge status: Home / Self Care | Payer: BLUE CROSS/BLUE SHIELD | Source: Ambulatory Visit | Attending: Adult Health | Admitting: Adult Health

## 2016-10-01 ENCOUNTER — Other Ambulatory Visit: Payer: Self-pay | Admitting: Adult Health

## 2016-10-01 DIAGNOSIS — R590 Localized enlarged lymph nodes: Secondary | ICD-10-CM

## 2016-10-01 DIAGNOSIS — N6489 Other specified disorders of breast: Secondary | ICD-10-CM | POA: Diagnosis not present

## 2016-10-02 ENCOUNTER — Telehealth: Payer: Self-pay

## 2016-10-02 NOTE — Telephone Encounter (Signed)
s/w pt and she will be glad to have the genetics testing. She volunteered her prior testing in 2008 was at [facility]. Confirmed with Santiago Glad she still qualifies for testing. inbasket sent.

## 2016-10-02 NOTE — Telephone Encounter (Signed)
-----  Message from Gardenia Phlegm, NP sent at 10/02/2016  9:25 AM EDT ----- Juliann Pulse, Please let patient know that she qualifies for updated testing.  If she would like it then we can send her to see one of our counselors.    Thanks,  Mendel Ryder ----- Message ----- From: Clarene Essex, Counselor Sent: 10/02/2016   7:27 AM To: Gardenia Phlegm, NP  I don't see that she was tested by [place].  If she was it would be in [place], but we have a list of patients that were tested by [place] through [facility] and she is not on that list. She would def. Qualify for updated testing.  Santiago Glad ----- Message ----- From: Gardenia Phlegm, NP Sent: 10/01/2016   4:31 PM To: Clarene Essex, Counselor  Any updated testing you would recommend for patient?  She told me she was tested in 2008, but I couldn't find anything in epic.    Thanks so much!  Mendel Ryder

## 2016-10-05 ENCOUNTER — Telehealth: Payer: Self-pay

## 2016-10-05 NOTE — Telephone Encounter (Signed)
Records faxed to [facility] Fax # [phone].

## 2016-10-16 ENCOUNTER — Other Ambulatory Visit: Payer: BLUE CROSS/BLUE SHIELD

## 2016-10-16 ENCOUNTER — Encounter: Payer: Self-pay | Admitting: Genetics

## 2016-10-16 ENCOUNTER — Ambulatory Visit (HOSPITAL_BASED_OUTPATIENT_CLINIC_OR_DEPARTMENT_OTHER): Payer: BLUE CROSS/BLUE SHIELD | Admitting: Genetics

## 2016-10-16 ENCOUNTER — Telehealth: Payer: Self-pay | Admitting: *Deleted

## 2016-10-16 DIAGNOSIS — Z808 Family history of malignant neoplasm of other organs or systems: Secondary | ICD-10-CM

## 2016-10-16 DIAGNOSIS — Z803 Family history of malignant neoplasm of breast: Secondary | ICD-10-CM

## 2016-10-16 DIAGNOSIS — Z800 Family history of malignant neoplasm of digestive organs: Secondary | ICD-10-CM | POA: Diagnosis not present

## 2016-10-16 DIAGNOSIS — Z853 Personal history of malignant neoplasm of breast: Secondary | ICD-10-CM | POA: Diagnosis not present

## 2016-10-16 DIAGNOSIS — Z7183 Encounter for nonprocreative genetic counseling: Secondary | ICD-10-CM

## 2016-10-16 NOTE — Progress Notes (Signed)
REFERRING PROVIDER: Gardenia Phlegm, NP [place], [place] [address]  PRIMARY PROVIDER:  Vikki Ports, MD  PRIMARY REASON FOR VISIT:  1. Personal history of breast cancer   2. Family history of breast cancer   3. Family history of rectal cancer   4. Family history of stomach cancer   5. Family history of malignant melanoma     HISTORY OF PRESENT ILLNESS:   Kristen Gamble, a 56 y.o. female, was seen for a [facility] cancer genetics consultation at the request of Dr. Delice Bison due to a personal and family history of cancer.  Kristen Gamble presents to clinic today to discuss the possibility of a hereditary predisposition to cancer, genetic testing, and to further clarify her future cancer risks, as well as potential cancer risks for family members.   In 2008, at the age of 36, Kristen Gamble was diagnosed with ER/PR+ HER2- invasive ductal carcinoma of the left breast. This was treated with bilateral mastectomy and anti-estrogen therapy until 2015. Kristen Gamble health history is also significant for TAH/BSO in September 2008. She reports that she previously underwent genetic testing of the BRCA1 and BRCA2 genes at [facility] in 2008 and was negative for mutations at that time. Kristen Gamble genetic testing result report was not available for review at the time of our meeting. However, she plans to find a copy and bring it to [place] to include in her records. She presents today to discuss updated testing for hereditary cancer syndromes.  CANCER HISTORY:    Breast cancer, left breast ([place])   01/2007 Definitive Surgery    Bilateral mastectomies with subpectoral implant placement: IDC, grade 1, ER/PR+, HER2/neu negative      01/2007 Pathologic Stage    Stage IA: T1c N0       01/2007 Oncotype testing    RS 13 (8% ROR)      02/2007 - 01/2014 Anti-estrogen oral therapy    Tamoxifen 20 mg then transitioned to letrzole with lupron then tamoxifen again (11/09) then pt discontinued 06/2010 and resumed 01/2011,  ultimately discontinued 01/2014       HORMONAL RISK FACTORS:  Menarche was at age 81.  First live birth at age 44.  OCP use for approximately used, but length of time unassessed.  Ovaries intact: no.  Hysterectomy: yes.  Menopausal status: postmenopausal.  HRT use: unassessed. Colonoscopy: yes; normal. Most recent colonoscopy in 04/2016 was normal. History of single tubular adenoma in 2014. Mammogram within the last year: no. Number of breast biopsies: unassessed. Up to date with pelvic exams:  yes. Any excessive radiation exposure in the past: no  Past Medical History:  Diagnosis Date  . ADHD (attention deficit hyperactivity disorder) 08/2011  . Arthritis   . Breast cancer ([place]) 10/2006   left  . Complication of anesthesia    nausea  . Constipation   . Hematuria   . History of kidney stones   . Nephrolithiasis    and stone distal R ureter on CT and R hydronephrosis  . Numbness    under l arm  . Osteopenia 05/2013   T score minus 1.7  . PONV (postoperative nausea and vomiting)   . TMJ (temporomandibular joint disorder)     Past Surgical History:  Procedure Laterality Date  . ABDOMINAL HYSTERECTOMY    . AUGMENTATION MAMMAPLASTY  1999  . BREAST BIOPSY     x4 in 2009-2012, all benign  . BREAST SURGERY  2008   Lumpectomy-Bilateral mastectomy-implants  . COLPOSCOPY    .  CYSTOSCOPY    . CYSTOSCOPY WITH RETROGRADE PYELOGRAM, URETEROSCOPY AND STENT PLACEMENT Left 11/05/2014   Procedure: CYSTOSCOPY WITH RETROGRADE PYELOGRAM, URETEROSCOPY AND STENT PLACEMENT;  Surgeon: Franchot Gallo, MD;  Location: [facility];  Service: Urology;  Laterality: Left;  . CYSTOSCOPY/RETROGRADE/URETEROSCOPY/STONE EXTRACTION WITH BASKET Right 11/13/2013   Procedure: CYSTOSCOPY, RIGHT RETROGRADE PYLOGRAM WITH INTERPRETATION, RIGHT URETEROSCOPY WITH DILATION OF URETERAL STRICTURE WITH BASKET EXTRACTION OF STONE;  Surgeon: Ailene Rud, MD;  Location: [facility];  Service: Urology;   Laterality: Right;  . FOOT SURGERY  2014   Right big toe  . GYNECOLOGIC CRYOSURGERY  age 89  . LAPAROSCOPIC TOTAL HYSTERECTOMY    . MASTECTOMY Bilateral 01-2007  . PARATHYROIDECTOMY N/A 07/07/2015   Procedure: PARATHYROIDECTOMY;  Surgeon: Armandina Gemma, MD;  Location: [facility];  Service: General;  Laterality: N/A;  . PELVIC LAPAROSCOPY     LAP HYST BSO  . STONE EXTRACTION WITH BASKET Left 11/05/2014   Procedure: STONE EXTRACTION WITH BASKET;  Surgeon: Franchot Gallo, MD;  Location: [facility];  Service: Urology;  Laterality: Left;  . TONSILLECTOMY  1971  . TOTAL ABDOMINAL HYSTERECTOMY W/ BILATERAL SALPINGOOPHORECTOMY  9/08    Social History   Social History  . Marital status: Married    Spouse name: N/A  . Number of children: 4  . Years of education: N/A   Occupational History  . 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 Smoking status: Never Smoker  . Smokeless tobacco: Never Used  . Alcohol use 2.5 oz/week    5 Standard drinks or equivalent per week     Comment: 1 glass of wine 4-5 times week.  . Drug use: No  . Sexual activity: Yes    Partners: Male    Birth control/ protection: Surgical     Comment: HYST   Other Topics Concern  . Not on file   Social History Narrative   Lives with her husband, 2 dogs, 2 birds.  She has boy/girl twins, and a daughter in college.  Kristen Gamble is married, has 2 granddaughters     FAMILY HISTORY:  We obtained a detailed, 4-generation family history.  Significant diagnoses are listed below: Family History  Problem Relation Age of Onset  . Diabetes Father   . Alcohol abuse Father   . Depression Father   . Ulcers Father   . Stomach cancer Father 61    treated with partial gastrectomy. Died of other causes at 53.  . Diabetes Maternal Grandmother   . Hypertension Maternal Grandmother   . Heart attack Maternal Grandmother   . Heart disease Maternal Grandmother   . Hypothyroidism Maternal Grandmother    . Melanoma Maternal Grandmother 60    d.90  . Hypertension Maternal Grandfather   . Stroke Maternal Grandfather   . Asthma Maternal Grandfather   . Colon polyps Maternal Grandfather   . Heart attack Paternal Grandfather   . Heart disease Paternal Grandfather 62    died of MI  . Arthritis Mother   . Hypothyroidism Mother   . Diabetes Brother   . Breast cancer Paternal Aunt 1    d.70s  . Rectal cancer Cousin 40    d.21s Daughter of unaffected maternal uncle.   Kristen Gamble has one biological daughter who is 63 without cancers or tumors. Her biological son died at age 32.5 from myocarditis. Ms. Dicenso also has an adopted son and daughter who are twins, age 82. Ms. Nestler has one brother,  age 63 without cancers.  Ms. Sanborn father died at age 19 from complications associated with alcohol abuse. He previously had a history of stomach cancer in his 25s which was treated with partial gastrectomy. Ms. Froberg father had 4 sisters and 2 brothers. One sister had breast cancer in her 71s and died in her 19s. Another sister died without cancers in her 30s. The other 2 sisters are in their 69s without cancers. Ms. Agostinelli paternal grandmother died at 71 without cancer. Her paternal grandfather died in his 13s from a heart attack.  Ms. Kemnitz mother is 76 without cancers. She underwent TAH/BSO around age 38 for reasons unrelated to cancer. Ms. Smyser mother has 2 brothers and a sister. One brother died at 8 without cancers. The other brother is 80 without cancers. This brother has a daughter who died in her 70s from rectal cancer. Ms. Bobrowski mother's sister is 55 without cancers. Ms. Apsey maternal grandmother had melanoma in her 97s and died at 17. Her maternal grandfather died in his 46s and had a history of colon polyps.  Ms. Illingworth is unaware of previous family history of genetic testing for hereditary cancer risks besides her own. Patient's maternal ancestors are of Native [place] and [place] descent, and paternal  ancestors are of [place] descent. There is no reported Ashkenazi Jewish ancestry. There is no known consanguinity.  GENETIC COUNSELING ASSESSMENT: Kristen Gamble is a 56 y.o. female with a personal and family history of young breast, stomach, and rectal cancers which is suggestive of a hereditary cancer syndrome and predisposition to cancer. We, therefore, discussed and recommended the following at today's visit.   DISCUSSION: We reviewed the characteristics, features and inheritance patterns of hereditary cancer syndromes. We also discussed genetic testing, including the appropriate family members to test, the process of testing, insurance coverage and turn-around-time for results. We discussed the implications of a negative, positive and/or variant of uncertain significant result. We recommended Kristen Gamble pursue genetic testing for the 46-gene Common Hereditary Cancers Panel offered by [facility].   Based on Kristen Gamble personal and family history of cancer, she meets medical criteria for genetic testing. Despite that she meets criteria, she may still have an out of pocket cost. We discussed that if her out of pocket cost for testing is over $100, the laboratory will call and confirm whether she wants to proceed with testing.  If the out of pocket cost of testing is less than $100 she will be billed by the genetic testing laboratory. We also reviewed Kristen Gamble option to pursue testing through [facility]'s self-pay price of $250.   PLAN: After considering the risks, benefits, and limitations, Kristen Gamble  declines genetic testing at this time. She plans to continue considering her options and will call us if/when she decides to pursue testing. We understand this decision, and remain available to coordinate genetic testing at any time in the future. We, therefore, recommend Kristen Gamble continue to follow the cancer screening guidelines given by her primary healthcare provider. Her biological daughter is advised to begin  annual mammograms at age 64 (41 years prior to Kristen Gamble's age at diagnosis). At that time, her daughter should consult her physician as to whether supplementing annual mammograms with annual breast MRIs is indicated.  Lastly, we encouraged Kristen Gamble to remain in contact with cancer genetics annually so that we can continuously update the family history and inform her of any changes in cancer genetics and testing that may be of benefit for this  family.   Ms.  Gamble questions were answered to her satisfaction today. Our contact information was provided should additional questions or concerns arise. Thank you for the referral and allowing [place] to share in the care of your patient.   Kristen Misty, MS, [facility] Certified Naval [email or web address] phone: [phone]  The patient was seen for a total of 30 minutes in face-to-face genetic counseling.    _______________________________________________________________________ For Office Staff:  Number of people involved in session: 1 Was an Intern/ student involved with case: no

## 2016-10-16 NOTE — Telephone Encounter (Signed)
Message received on VM for this RN from " Delight Hoh stating he is assisting pt to obtain life insurance policy and has attempted to obtain medical records ".  " all I get is VM's and I need to know if my request is being processed "  This message will be forwarded to appropriate departments - including HIM and [facility].

## 2016-10-29 DIAGNOSIS — H16292 Other keratoconjunctivitis, left eye: Secondary | ICD-10-CM | POA: Diagnosis not present

## 2016-12-31 DIAGNOSIS — F321 Major depressive disorder, single episode, moderate: Secondary | ICD-10-CM | POA: Diagnosis not present

## 2017-02-12 DIAGNOSIS — M1812 Unilateral primary osteoarthritis of first carpometacarpal joint, left hand: Secondary | ICD-10-CM | POA: Diagnosis not present

## 2017-02-18 DIAGNOSIS — M79672 Pain in left foot: Secondary | ICD-10-CM | POA: Diagnosis not present

## 2017-02-18 DIAGNOSIS — Z4789 Encounter for other orthopedic aftercare: Secondary | ICD-10-CM | POA: Diagnosis not present

## 2017-02-27 DIAGNOSIS — F418 Other specified anxiety disorders: Secondary | ICD-10-CM | POA: Diagnosis not present

## 2017-02-27 DIAGNOSIS — Z853 Personal history of malignant neoplasm of breast: Secondary | ICD-10-CM | POA: Diagnosis not present

## 2017-02-27 DIAGNOSIS — E559 Vitamin D deficiency, unspecified: Secondary | ICD-10-CM | POA: Diagnosis not present

## 2017-02-27 DIAGNOSIS — K5900 Constipation, unspecified: Secondary | ICD-10-CM | POA: Diagnosis not present

## 2017-02-27 DIAGNOSIS — R5383 Other fatigue: Secondary | ICD-10-CM | POA: Diagnosis not present

## 2017-03-07 DIAGNOSIS — R5383 Other fatigue: Secondary | ICD-10-CM | POA: Diagnosis not present

## 2017-03-19 DIAGNOSIS — Z6823 Body mass index (BMI) 23.0-23.9, adult: Secondary | ICD-10-CM | POA: Diagnosis not present

## 2017-03-19 DIAGNOSIS — Z01419 Encounter for gynecological examination (general) (routine) without abnormal findings: Secondary | ICD-10-CM | POA: Diagnosis not present

## 2017-03-19 DIAGNOSIS — Z1212 Encounter for screening for malignant neoplasm of rectum: Secondary | ICD-10-CM | POA: Diagnosis not present

## 2017-03-25 DIAGNOSIS — B079 Viral wart, unspecified: Secondary | ICD-10-CM | POA: Diagnosis not present

## 2017-03-25 DIAGNOSIS — D485 Neoplasm of uncertain behavior of skin: Secondary | ICD-10-CM | POA: Diagnosis not present

## 2017-03-29 DIAGNOSIS — M79672 Pain in left foot: Secondary | ICD-10-CM | POA: Diagnosis not present

## 2017-04-01 DIAGNOSIS — R5383 Other fatigue: Secondary | ICD-10-CM | POA: Diagnosis not present

## 2017-04-01 DIAGNOSIS — K5900 Constipation, unspecified: Secondary | ICD-10-CM | POA: Diagnosis not present

## 2017-04-01 DIAGNOSIS — F418 Other specified anxiety disorders: Secondary | ICD-10-CM | POA: Diagnosis not present

## 2017-04-01 DIAGNOSIS — Z853 Personal history of malignant neoplasm of breast: Secondary | ICD-10-CM | POA: Diagnosis not present

## 2017-04-15 DIAGNOSIS — H16202 Unspecified keratoconjunctivitis, left eye: Secondary | ICD-10-CM | POA: Diagnosis not present

## 2017-05-29 ENCOUNTER — Encounter: Payer: Self-pay | Admitting: Family Medicine

## 2017-05-29 ENCOUNTER — Ambulatory Visit (INDEPENDENT_AMBULATORY_CARE_PROVIDER_SITE_OTHER): Payer: BLUE CROSS/BLUE SHIELD | Admitting: Family Medicine

## 2017-05-29 VITALS — BP 100/60 | HR 72 | Temp 99.9°F | Ht 63.5 in | Wt 133.6 lb

## 2017-05-29 DIAGNOSIS — J019 Acute sinusitis, unspecified: Secondary | ICD-10-CM | POA: Diagnosis not present

## 2017-05-29 DIAGNOSIS — J309 Allergic rhinitis, unspecified: Secondary | ICD-10-CM

## 2017-05-29 MED ORDER — AMOXICILLIN 500 MG PO TABS: 1000.0000 mg | ORAL_TABLET | Freq: Two times a day (BID) | ORAL | 0 refills | Status: DC

## 2017-05-29 NOTE — Progress Notes (Signed)
Chief Complaint  Patient presents with  . Cough    and ST, sinus drainage and HA. Chest hurts and has been taking OTC cold and sinus NYquil day and night as well as sinus rinses x 10 days. Mucus is thick and yellow. Not sure if she has had any fevers.     10 days ago she started with allergy symptoms--runny nose, congestion and slight cough. She started using Flonase which helped some.  She was in [place] last week, and while she was away she started getting worse-- increased cough, sore throat. Head and chest hurts from coughing.  She is drinking more water. She started feeling a little better, but now is getting worse again.  Headaches behind her eyes.  No nasal drainage.  Cough is productive of yellow phlegm. She has postnasal drainage, feels like it is in the back of her throat.  Bad taste in her mouth. She has chest discomfort/congestion and sore from coughing. Denies shortness of breath.  She has taken Dayquil, Nyquil (none today), nasal saline, Flonase. Lozenges for cough.  She had some occasional chills.  No fever, body aches. Hasn't had flu shot yet.  PMH, PSH, SH reviewed  Outpatient Encounter Medications as of 05/29/2017  Medication Sig  . fluticasone (FLONASE) 50 MCG/ACT nasal spray Place 2 sprays into the nose daily. (Patient taking differently: Place 2 sprays into the nose daily as needed for allergies. )  . L-Methylfolate-Algae (DEPLIN 15 PO) Take by mouth.  . Multiple Vitamins-Minerals (MULTIVITAMIN WITH MINERALS) tablet Take 1 tablet daily by mouth.  . polyethylene glycol (MIRALAX / GLYCOLAX) packet Take 17 g by mouth daily as needed.  . traZODone (DESYREL) 100 MG tablet Take 150 mg by mouth.  . vitamin B-12 (CYANOCOBALAMIN) 100 MCG tablet Take 100 mcg by mouth daily.  Marland Kitchen amoxicillin (AMOXIL) 500 MG tablet Take 2 tablets (1,000 mg total) 2 (two) times daily by mouth.  . fexofenadine (ALLEGRA) 180 MG tablet Take 180 mg by mouth daily as needed for allergies.   . [DISCONTINUED]  fluticasone (FLONASE) 50 MCG/ACT nasal spray Place into the nose.  . [DISCONTINUED] HYDROcodone-acetaminophen (NORCO/VICODIN) 5-325 MG tablet Take 1-2 tablets by mouth every 4 (four) hours as needed for moderate pain. (Patient not taking: Reported on 09/27/2016)  . [DISCONTINUED] loratadine (CLARITIN) 10 MG tablet Take 10 mg by mouth daily.  . [DISCONTINUED] polyethylene glycol (MIRALAX / GLYCOLAX) packet Take by mouth.  . [DISCONTINUED] traZODone (DESYREL) 150 MG tablet Take 150 mg by mouth at bedtime.   No facility-administered encounter medications on file as of 05/29/2017.    (amox was rx'd today, not using prior to visit)  Allergies  Allergen Reactions  . Morphine Itching    REACTION: itching REACTION: itching   ROS: +sinus pain, no dizziness, chest pain, shortness of breath, nausea, vomiting, diarrhea, bleeding, bruising, rash.  +URI symptoms per HPI. Moods are good.  PHYSICAL EXAM:  BP 100/60   Pulse 72   Temp 99.9 F (37.7 C) (Tympanic)   Ht 5' 3.5" (1.613 m)   Wt 133 lb 9.6 oz (60.6 kg)   BMI 23.29 kg/m   Well appearing, pleasant female, with frequent throat-clearing and cough during visit. HEENT: PERRL, EOMI, conjunctiva and sclera are clear.  TM's and EAC's normal. Nasal mucosa appears normal, no purulence noted. Sinuses minimally tender x4 OP is clear, no erythema, moist mucus membranes Neck: no lymphadenopathy or mass Heart: regular rate and rhythm Lungs: clear bilaterally with good air movement Skin: normal turgor, no rash  Psych: normal mood, affect, hygiene and grooming Neuro: alert and oriented, cranial nerves intact, normal strength, gait   ASSESSMENT/PLAN:   Acute non-recurrent sinusitis, unspecified location - Plan: amoxicillin (AMOXIL) 500 MG tablet  Allergic rhinitis, unspecified seasonality, unspecified trigger  Return for flu shot when better.   Drink plenty of water. Continue to use Flonase and nasal saline. Consider sinus rinses (neti-pot or  sinus rinse kit). Start using Guaifenesin (I recommend 12 hour Mucinex--you can use the plain or the DM version.  The DM version has dextromethorphan which is a cough suppressant, versus using the plain mucinex and getting a separate delsym syrup, as needed). Don't use Dayquil or Nyquil if taking DM products. Okay to use sudafed as a decongestant for sinus pain if needed.  Take the full course of amoxacillin. Contact us if your symptoms persist or worsen.

## 2017-05-29 NOTE — Patient Instructions (Signed)
  Drink plenty of water. Continue to use Flonase and nasal saline. Consider sinus rinses (neti-pot or sinus rinse kit). Start using Guaifenesin (I recommend 12 hour Mucinex--you can use the plain or the DM version.  The DM version has dextromethorphan which is a cough suppressant, versus using the plain mucinex and getting a separate delsym syrup, as needed). Don't use Dayquil or Nyquil if taking DM products. Okay to use sudafed as a decongestant for sinus pain if needed.  Take the full course of amoxacillin. Contact us if your symptoms persist or worsen.

## 2017-06-17 DIAGNOSIS — F321 Major depressive disorder, single episode, moderate: Secondary | ICD-10-CM | POA: Diagnosis not present

## 2017-06-17 DIAGNOSIS — Z23 Encounter for immunization: Secondary | ICD-10-CM | POA: Diagnosis not present

## 2017-08-09 DIAGNOSIS — N951 Menopausal and female climacteric states: Secondary | ICD-10-CM | POA: Diagnosis not present

## 2017-08-09 DIAGNOSIS — E039 Hypothyroidism, unspecified: Secondary | ICD-10-CM | POA: Diagnosis not present

## 2017-08-09 DIAGNOSIS — R5383 Other fatigue: Secondary | ICD-10-CM | POA: Diagnosis not present

## 2017-08-22 DIAGNOSIS — K5900 Constipation, unspecified: Secondary | ICD-10-CM | POA: Diagnosis not present

## 2017-08-22 DIAGNOSIS — R5383 Other fatigue: Secondary | ICD-10-CM | POA: Diagnosis not present

## 2017-08-22 DIAGNOSIS — E039 Hypothyroidism, unspecified: Secondary | ICD-10-CM | POA: Diagnosis not present

## 2017-08-22 DIAGNOSIS — E2749 Other adrenocortical insufficiency: Secondary | ICD-10-CM | POA: Diagnosis not present

## 2017-09-09 ENCOUNTER — Ambulatory Visit (INDEPENDENT_AMBULATORY_CARE_PROVIDER_SITE_OTHER): Payer: BLUE CROSS/BLUE SHIELD | Admitting: Family Medicine

## 2017-09-09 ENCOUNTER — Encounter: Payer: Self-pay | Admitting: Family Medicine

## 2017-09-09 VITALS — BP 104/60 | HR 60 | Temp 97.6°F | Ht 63.5 in | Wt 131.8 lb

## 2017-09-09 DIAGNOSIS — J309 Allergic rhinitis, unspecified: Secondary | ICD-10-CM

## 2017-09-09 DIAGNOSIS — H6501 Acute serous otitis media, right ear: Secondary | ICD-10-CM | POA: Diagnosis not present

## 2017-09-09 MED ORDER — AMOXICILLIN 875 MG PO TABS: 875.0000 mg | ORAL_TABLET | Freq: Two times a day (BID) | ORAL | 0 refills | Status: DC

## 2017-09-09 MED ORDER — FLUTICASONE PROPIONATE 50 MCG/ACT NA SUSP: 2.0000 | Freq: Every day | NASAL | 11 refills | Status: DC

## 2017-09-09 NOTE — Progress Notes (Signed)
Chief Complaint  Patient presents with  . Ear Pain    right ear pain. First of this month started with sinus pain. Started using netti pot and thinks she got water in her ear from that. Sinuses feel a little better but over the last 4-5 days her right ear has worsened.     She had some cold symptoms earlier this month. She took sudafed and mucinex, and did neti-pot. She thinks she got water in her ear from the neti-pot. She is having pain in the right ear, and pain on her right neck, and also pain with swallowing just on the right side.  She started back with increased congestion/headaches (behind her eyes) in the last few days.  Restarted sudafed and mucinex last week.  Mucus is clear.  Some chills, no fevers.  PMH, PSH. SH reviewed  Outpatient Encounter Medications as of 09/09/2017  Medication Sig  . guaiFENesin (MUCINEX) 600 MG 12 hr tablet Take 600 mg by mouth 2 (two) times daily.  Marland Kitchen L-Methylfolate-Algae (DEPLIN 15 PO) Take by mouth.  . Multiple Vitamins-Minerals (MULTIVITAMIN WITH MINERALS) tablet Take 1 tablet daily by mouth.  . phenylephrine (SUDAFED PE) 10 MG TABS tablet Take 10 mg by mouth every 4 (four) hours as needed.  . polyethylene glycol (MIRALAX / GLYCOLAX) packet Take 17 g by mouth daily as needed.  . Thyroid 48.75 MG TABS Take 1 tablet by mouth daily.  . traZODone (DESYREL) 100 MG tablet Take 150 mg by mouth.  . vitamin B-12 (CYANOCOBALAMIN) 100 MCG tablet Take 100 mcg by mouth daily.  . fluticasone (FLONASE) 50 MCG/ACT nasal spray Place 2 sprays into the nose daily. (Patient not taking: Reported on 09/09/2017)  . [DISCONTINUED] amoxicillin (AMOXIL) 500 MG tablet Take 2 tablets (1,000 mg total) 2 (two) times daily by mouth.  . [DISCONTINUED] fexofenadine (ALLEGRA) 180 MG tablet Take 180 mg by mouth daily as needed for allergies.    No facility-administered encounter medications on file as of 09/09/2017.    Allergies  Allergen Reactions  . Morphine Itching    REACTION:  itching REACTION: itching   ROS: no fever, some chills.  No dizziness, chest pain, shortness of breath, nausea, vomiting, diarrhea, bleeding, bruising, rash.  URI complaints as per HPI.  Moods are good.  She is looking forward to her children coming home for spring break, traveling/college visits.  PHYSICAL EXAM:  BP 104/60   Pulse 60   Temp 97.6 F (36.4 C) (Tympanic)   Ht 5' 3.5" (1.613 m)   Wt 131 lb 12.8 oz (59.8 kg)   BMI 22.98 kg/m   Well-appearing, pleasant female in no distress HEENT: PERRL, EOMI, conjunctiva and sclera are clear. L TM and EAC normal. R TM--some small bubbles noted, slightly white, no erythema Nasal mucosa is mod edematous, no erythema or purulence. OP is clear, sinuses nontender Neck: slight tender lymphadenopathy on the right. No masses Heart: regular rate and rhythm Lungs: clear bilaterally Neuro: alert and oriented, cranial nerves intact, normal gait   ASSESSMENT/PLAN:  Right acute serous otitis media, recurrence not specified - No bacterial infection at this time. Given amoxil rx to start if worsening pain, fever develop - Plan: amoxicillin (AMOXIL) 875 MG tablet  Allergic rhinitis - restart zyrtec and flonase, which is also likely to help with her ear discomfort - Plan: fluticasone (FLONASE) 50 MCG/ACT nasal spray   Restart zyrtec and Flonase for allergies. Continue sudafed and mucinex as needed Take the antibiotic if you have increased pain or  fever in the next few days.

## 2017-09-09 NOTE — Patient Instructions (Signed)
  Restart zyrtec and Flonase for allergies. Continue sudafed and mucinex as needed Take the antibiotic if you have increased pain or fever in the next few days.

## 2017-09-26 ENCOUNTER — Inpatient Hospital Stay: Payer: BLUE CROSS/BLUE SHIELD | Attending: Adult Health | Admitting: Adult Health

## 2017-09-26 ENCOUNTER — Telehealth: Payer: Self-pay | Admitting: Adult Health

## 2017-09-26 ENCOUNTER — Encounter: Payer: Self-pay | Admitting: Adult Health

## 2017-09-26 DIAGNOSIS — N644 Mastodynia: Secondary | ICD-10-CM | POA: Insufficient documentation

## 2017-09-26 DIAGNOSIS — Z978 Presence of other specified devices: Secondary | ICD-10-CM

## 2017-09-26 DIAGNOSIS — Z853 Personal history of malignant neoplasm of breast: Secondary | ICD-10-CM | POA: Diagnosis not present

## 2017-09-26 DIAGNOSIS — M8580 Other specified disorders of bone density and structure, unspecified site: Secondary | ICD-10-CM | POA: Diagnosis not present

## 2017-09-26 DIAGNOSIS — Z9223 Personal history of estrogen therapy: Secondary | ICD-10-CM | POA: Diagnosis not present

## 2017-09-26 DIAGNOSIS — Z803 Family history of malignant neoplasm of breast: Secondary | ICD-10-CM | POA: Insufficient documentation

## 2017-09-26 DIAGNOSIS — Z170 Estrogen receptor positive status [ER+]: Secondary | ICD-10-CM | POA: Insufficient documentation

## 2017-09-26 DIAGNOSIS — Z79899 Other long term (current) drug therapy: Secondary | ICD-10-CM | POA: Insufficient documentation

## 2017-09-26 DIAGNOSIS — Z9013 Acquired absence of bilateral breasts and nipples: Secondary | ICD-10-CM | POA: Diagnosis not present

## 2017-09-26 DIAGNOSIS — T8541XA Breakdown (mechanical) of breast prosthesis and implant, initial encounter: Secondary | ICD-10-CM | POA: Insufficient documentation

## 2017-09-26 DIAGNOSIS — Z9882 Breast implant status: Secondary | ICD-10-CM | POA: Diagnosis not present

## 2017-09-26 DIAGNOSIS — Z800 Family history of malignant neoplasm of digestive organs: Secondary | ICD-10-CM | POA: Diagnosis not present

## 2017-09-26 NOTE — Telephone Encounter (Signed)
Per 3/14 los decline avs and calendar

## 2017-09-26 NOTE — Progress Notes (Signed)
CLINIC:  Survivorship   REASON FOR VISIT:  Routine follow-up for history of breast cancer.   BRIEF ONCOLOGIC HISTORY:    Breast cancer, left breast ([place])   01/2007 Definitive Surgery    Bilateral mastectomies with subpectoral implant placement: IDC, grade 1, ER/PR+, HER2/neu negative      01/2007 Pathologic Stage    Stage IA: T1c N0       01/2007 Oncotype testing    RS 13 (8% ROR)      02/2007 - 01/2014 Anti-estrogen oral therapy    Tamoxifen 20 mg then transitioned to letrzole with lupron then tamoxifen again (11/09) then pt discontinued 06/2010 and resumed 01/2011, ultimately discontinued 01/2014        INTERVAL HISTORY:  Kristen Gamble presents to the [facility] today for routine follow-up for her history of breast cancer.  Overall, she reports feeling quite well. She has noted some pain in her left upper breast x 1-2 months.  She hasn't noted it being related to her cycles.  She has silicone implants that are 56 years old, and she feels like her left implant is flatter, and distorts with body movement more so than usual.  She denies any other issues today.  She sees her PCP regularly, exercises regularly, and is up to date with her cancer screenings.     REVIEW OF SYSTEMS:  Review of Systems  Constitutional: Negative for appetite change, chills, fatigue and fever.  HENT:   Negative for hearing loss, lump/mass and trouble swallowing.   Eyes: Negative for eye problems and icterus.  Respiratory: Negative for chest tightness, cough and shortness of breath.   Cardiovascular: Negative for chest pain, leg swelling and palpitations.  Gastrointestinal: Negative for abdominal distention and abdominal pain.  Endocrine: Negative for hot flashes.  Genitourinary: Negative for difficulty urinating.   Musculoskeletal: Negative for arthralgias.  Skin: Negative for itching and rash.  Neurological: Negative for dizziness, extremity weakness, headaches and numbness.  Hematological:  Negative for adenopathy. Does not bruise/bleed easily.  Psychiatric/Behavioral: Negative for depression. The patient is not nervous/anxious.          PAST MEDICAL/SURGICAL HISTORY:  Past Medical History:  Diagnosis Date  . ADHD (attention deficit hyperactivity disorder) 08/2011  . Arthritis   . Breast cancer ([place]) 10/2006   left  . Complication of anesthesia    nausea  . Constipation   . Hematuria   . History of kidney stones   . Nephrolithiasis    and stone distal R ureter on CT and R hydronephrosis  . Numbness    under l arm  . Osteopenia 05/2013   T score minus 1.7  . PONV (postoperative nausea and vomiting)   . TMJ (temporomandibular joint disorder)    Past Surgical History:  Procedure Laterality Date  . ABDOMINAL HYSTERECTOMY    . AUGMENTATION MAMMAPLASTY  1999  . BREAST BIOPSY     x4 in 2009-2012, all benign  . BREAST SURGERY  2008   Lumpectomy-Bilateral mastectomy-implants  . COLPOSCOPY    . CYSTOSCOPY    . CYSTOSCOPY WITH RETROGRADE PYELOGRAM, URETEROSCOPY AND STENT PLACEMENT Left 11/05/2014   Procedure: CYSTOSCOPY WITH RETROGRADE PYELOGRAM, URETEROSCOPY AND STENT PLACEMENT;  Surgeon: Franchot Gallo, MD;  Location: [facility];  Service: Urology;  Laterality: Left;  . CYSTOSCOPY/RETROGRADE/URETEROSCOPY/STONE EXTRACTION WITH BASKET Right 11/13/2013   Procedure: CYSTOSCOPY, RIGHT RETROGRADE PYLOGRAM WITH INTERPRETATION, RIGHT URETEROSCOPY WITH DILATION OF URETERAL STRICTURE WITH BASKET EXTRACTION OF STONE;  Surgeon: Ailene Rud, MD;  Location:  [facility];  Service: Urology;  Laterality: Right;  . FOOT SURGERY  2014   Right big toe  . GYNECOLOGIC CRYOSURGERY  age 31  . LAPAROSCOPIC TOTAL HYSTERECTOMY    . MASTECTOMY Bilateral 01-2007  . PARATHYROIDECTOMY N/A 07/07/2015   Procedure: PARATHYROIDECTOMY;  Surgeon: Armandina Gemma, MD;  Location: [place];  Service: General;  Laterality: N/A;  . PELVIC LAPAROSCOPY     LAP HYST BSO  . STONE EXTRACTION  WITH BASKET Left 11/05/2014   Procedure: STONE EXTRACTION WITH BASKET;  Surgeon: Franchot Gallo, MD;  Location: [facility];  Service: Urology;  Laterality: Left;  . TONSILLECTOMY  1971  . TOTAL ABDOMINAL HYSTERECTOMY W/ BILATERAL SALPINGOOPHORECTOMY  9/08     ALLERGIES:  Allergies  Allergen Reactions  . Morphine Itching    REACTION: itching REACTION: itching     CURRENT MEDICATIONS:  Outpatient Encounter Medications as of 09/26/2017  Medication Sig  . cetirizine (ZYRTEC) 10 MG chewable tablet Chew 10 mg by mouth daily.  . fluticasone (FLONASE) 50 MCG/ACT nasal spray Place 2 sprays into both nostrils daily.  Marland Kitchen guaiFENesin (MUCINEX) 600 MG 12 hr tablet Take 600 mg by mouth 2 (two) times daily.  Marland Kitchen L-Methylfolate-Algae (DEPLIN 15 PO) Take by mouth.  . Multiple Vitamins-Minerals (MULTIVITAMIN WITH MINERALS) tablet Take 1 tablet daily by mouth.  . phenylephrine (SUDAFED PE) 10 MG TABS tablet Take 10 mg by mouth every 4 (four) hours as needed.  . polyethylene glycol (MIRALAX / GLYCOLAX) packet Take 17 g by mouth daily as needed.  . Thyroid 48.75 MG TABS Take 1 tablet by mouth daily.  . traZODone (DESYREL) 100 MG tablet Take 150 mg by mouth.  . vitamin B-12 (CYANOCOBALAMIN) 100 MCG tablet Take 100 mcg by mouth daily.  . [DISCONTINUED] amoxicillin (AMOXIL) 875 MG tablet Take 1 tablet (875 mg total) by mouth 2 (two) times daily.   No facility-administered encounter medications on file as of 09/26/2017.      ONCOLOGIC FAMILY HISTORY:  Family History  Problem Relation Age of Onset  . Diabetes Father   . Alcohol abuse Father   . Depression Father   . Ulcers Father   . Stomach cancer Father 20       treated with partial gastrectomy. Died of other causes at 53.  . Diabetes Maternal Grandmother   . Hypertension Maternal Grandmother   . Heart attack Maternal Grandmother   . Heart disease Maternal Grandmother   . Hypothyroidism Maternal Grandmother   . Melanoma Maternal  Grandmother 60       d.90  . Hypertension Maternal Grandfather   . Stroke Maternal Grandfather   . Asthma Maternal Grandfather   . Colon polyps Maternal Grandfather   . Heart attack Paternal Grandfather   . Heart disease Paternal Grandfather 56       died of MI  . Arthritis Mother   . Hypothyroidism Mother   . Diabetes Brother   . Breast cancer Paternal Aunt 21       d.70s  . Rectal cancer Cousin 40       d.62s Daughter of unaffected maternal uncle.     SOCIAL HISTORY:  Social History   Socioeconomic History  . Marital status: Married    Spouse name: Not on file  . Number of children: 4  . Years of education: Not on file  . Highest education level: Not on file  Social Needs  . Financial resource strain: Not on file  . Food insecurity - worry: Not  on file  . Food insecurity - inability: Not on file  . Transportation needs - medical: Not on file  . Transportation needs - non-medical: Not on file  Occupational History  . 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 Smoking status: Never Smoker  . Smokeless tobacco: Never Used  Substance and Sexual Activity  . Alcohol use: Yes    Alcohol/week: 2.5 oz    Types: 5 Standard drinks or equivalent per week    Comment: 1 glass of wine 4-5 times week.  . Drug use: No  . Sexual activity: Yes    Partners: Male    Birth control/protection: Surgical    Comment: HYST  Other Topics Concern  . Not on file  Social History Narrative   Lives with her husband, 2 dogs, 2 birds.  She has boy/girl twins, and a daughter in college.  Joslyn Hy is married, has 2 granddaughters      PHYSICAL EXAMINATION:  Vital Signs: Vitals:   09/26/17 1320  BP: 105/70  Pulse: 81  Resp: 14  Temp: 98.4 F (36.9 C)  SpO2: 99%   Filed Weights   09/26/17 1320  Weight: 132 lb 12.8 oz (60.2 kg)   General: Well-nourished, well-appearing female in no acute distress.  Unaccompanied today.   HEENT: Head is normocephalic.  Pupils equal and  reactive to light. Conjunctivae clear without exudate.  Sclerae anicteric. Oral mucosa is pink, moist.  Oropharynx is pink without lesions or erythema.  Lymph: No cervical, supraclavicular, or infraclavicular lymphadenopathy noted on palpation.  Cardiovascular: Regular rate and rhythm.Marland Kitchen Respiratory: Clear to auscultation bilaterally. Chest expansion symmetric; breathing non-labored.  Breast Exam:  Right breast s/p mastectomy and implant placement, no swelling or masses, left breast with flattening noted more so than right breast, with implant distortion with shoulder abduction -Axilla: No axillary adenopathy bilaterally.  GI: Abdomen soft and round; non-tender, non-distended. Bowel sounds normoactive. No hepatosplenomegaly.   GU: Deferred.  Neuro: No focal deficits. Steady gait.  Psych: Mood and affect normal and appropriate for situation.  MSK: No focal spinal tenderness to palpation, full range of motion in bilateral upper extremities Extremities: No edema. Skin: Warm and dry.  LABORATORY DATA:  None for this visit   DIAGNOSTIC IMAGING:  Most recent mammogram:   N/a s/p bilateral mastectomies    ASSESSMENT AND PLAN:  Ms.. Anastas is a pleasant 57 y.o. female with history of Stage IA left breast invasive ductal carcinoma, ER+/PR+/HER2-, diagnosed in 01/2007, treated with bilateral mastectomies, and anti estrogen completed in 01/2014.  She [facility] the [facility] for surveillance and routine follow-up.   1. History of breast cancer:  Ms. Stupka is currently clinically and radiographically without evidence of disease or recurrence of breast cancer.   She will return in one year for surveillance and monitoring per protocol.  I encouraged her to call me with any questions or concerns before her next visit at the cancer center, and I would be happy to see her sooner, if needed.    2. Left implant distortion: MRI w w/o contrast to r/o silicone implant rupture, or less likely breast cancer  recurrence.    3. Cancer screening:  Due to Ms. Holloway's history and her age, she should receive screening for skin cancers, colon cancer, and GYN cancers. She was encouraged to follow-up with her PCP for appropriate cancer screenings.   4. Health maintenance and wellness promotion: Ms. Goyal was encouraged to consume 5-7 servings of fruits and vegetables per day.  She was also encouraged to engage in moderate to vigorous exercise for 30 minutes per day most days of the week. She was instructed to limit her alcohol consumption and continue to abstain from tobacco use.    Dispo:  -Return to cancer center in one year for lts follow up -MRI breasts   A total of (30) minutes of face-to-face time was spent with this patient with greater than 50% of that time in counseling and care-coordination.   Kristen Phlegm, NP Survivorship Program [facility] [phone]   Note: PRIMARY CARE PROVIDER Rita Ohara, [place] [phone]

## 2017-10-07 ENCOUNTER — Inpatient Hospital Stay: Payer: BLUE CROSS/BLUE SHIELD

## 2017-10-07 ENCOUNTER — Inpatient Hospital Stay (HOSPITAL_BASED_OUTPATIENT_CLINIC_OR_DEPARTMENT_OTHER): Payer: BLUE CROSS/BLUE SHIELD | Admitting: Genetic Counselor

## 2017-10-07 ENCOUNTER — Encounter: Payer: Self-pay | Admitting: Genetic Counselor

## 2017-10-07 DIAGNOSIS — Z803 Family history of malignant neoplasm of breast: Secondary | ICD-10-CM

## 2017-10-07 DIAGNOSIS — Z808 Family history of malignant neoplasm of other organs or systems: Secondary | ICD-10-CM

## 2017-10-07 DIAGNOSIS — Z800 Family history of malignant neoplasm of digestive organs: Secondary | ICD-10-CM

## 2017-10-07 DIAGNOSIS — C50912 Malignant neoplasm of unspecified site of left female breast: Secondary | ICD-10-CM

## 2017-10-07 DIAGNOSIS — Z170 Estrogen receptor positive status [ER+]: Secondary | ICD-10-CM

## 2017-10-07 DIAGNOSIS — Z1379 Encounter for other screening for genetic and chromosomal anomalies: Secondary | ICD-10-CM

## 2017-10-07 NOTE — Progress Notes (Signed)
REFERRING PROVIDER: Rita Ohara, MD [address] [place], [place] [address]  PRIMARY PROVIDER:  Rita Ohara, MD  PRIMARY REASON FOR VISIT:  1. Malignant neoplasm of left breast in female, estrogen receptor positive, unspecified site of breast ([place])   2. Family history of breast cancer   3. Family history of rectal cancer   4. Family history of melanoma      HISTORY OF PRESENT ILLNESS:   Kristen Gamble, a 57 y.o. female, was seen for a [facility] cancer genetics consultation at the request of Dr. Tomi Bamberger due to a personal and family history of cancer.  Kristen Gamble presents to clinic today to discuss the possibility of a hereditary predisposition to cancer, genetic testing, and to further clarify her future cancer risks, as well as potential cancer risks for family members.   In 2008, at the age of 89, Kristen Gamble was diagnosed with invasive ductal carcinoma of the left breast. This was treated with bilateral mastectomy, and anti-estrogen through 2015.  She has genetic testing at [facility] which is reportedly negative.  Kristen Gamble was seen in our office on October 16, 2016 for updated testing.  At that time she declined testing to think further on it.  She returns today for genetic testing.    CANCER HISTORY:    Breast cancer, left breast ([place])   01/2007 Definitive Surgery    Bilateral mastectomies with subpectoral implant placement: IDC, grade 1, ER/PR+, HER2/neu negative      01/2007 Pathologic Stage    Stage IA: T1c N0       01/2007 Oncotype testing    RS 13 (8% ROR)      02/2007 - 01/2014 Anti-estrogen oral therapy    Tamoxifen 20 mg then transitioned to letrzole with lupron then tamoxifen again (11/09) then pt discontinued 06/2010 and resumed 01/2011, ultimately discontinued 01/2014          Past Medical History:  Diagnosis Date  . ADHD (attention deficit hyperactivity disorder) 08/2011  . Arthritis   . Breast cancer ([place]) 10/2006   left  . Complication of anesthesia    nausea  .  Constipation   . Family history of breast cancer   . Family history of melanoma   . Family history of rectal cancer   . Hematuria   . History of kidney stones   . Nephrolithiasis    and stone distal R ureter on CT and R hydronephrosis  . Numbness    under l arm  . Osteopenia 05/2013   T score minus 1.7  . PONV (postoperative nausea and vomiting)   . TMJ (temporomandibular joint disorder)     Past Surgical History:  Procedure Laterality Date  . ABDOMINAL HYSTERECTOMY    . AUGMENTATION MAMMAPLASTY  1999  . BREAST BIOPSY     x4 in 2009-2012, all benign  . BREAST SURGERY  2008   Lumpectomy-Bilateral mastectomy-implants  . COLPOSCOPY    . CYSTOSCOPY    . CYSTOSCOPY WITH RETROGRADE PYELOGRAM, URETEROSCOPY AND STENT PLACEMENT Left 11/05/2014   Procedure: CYSTOSCOPY WITH RETROGRADE PYELOGRAM, URETEROSCOPY AND STENT PLACEMENT;  Surgeon: Franchot Gallo, MD;  Location: [facility];  Service: Urology;  Laterality: Left;  . CYSTOSCOPY/RETROGRADE/URETEROSCOPY/STONE EXTRACTION WITH BASKET Right 11/13/2013   Procedure: CYSTOSCOPY, RIGHT RETROGRADE PYLOGRAM WITH INTERPRETATION, RIGHT URETEROSCOPY WITH DILATION OF URETERAL STRICTURE WITH BASKET EXTRACTION OF STONE;  Surgeon: Ailene Rud, MD;  Location: [facility];  Service: Urology;  Laterality: Right;  . FOOT SURGERY  2014  Right big toe  . GYNECOLOGIC CRYOSURGERY  age 25  . LAPAROSCOPIC TOTAL HYSTERECTOMY    . MASTECTOMY Bilateral 01-2007  . PARATHYROIDECTOMY N/A 07/07/2015   Procedure: PARATHYROIDECTOMY;  Surgeon: Armandina Gemma, MD;  Location: [place];  Service: General;  Laterality: N/A;  . PELVIC LAPAROSCOPY     LAP HYST BSO  . STONE EXTRACTION WITH BASKET Left 11/05/2014   Procedure: STONE EXTRACTION WITH BASKET;  Surgeon: Franchot Gallo, MD;  Location: [facility];  Service: Urology;  Laterality: Left;  . TONSILLECTOMY  1971  . TOTAL ABDOMINAL HYSTERECTOMY W/ BILATERAL SALPINGOOPHORECTOMY  9/08     Social History   Socioeconomic History  . Marital status: Married    Spouse name: Not on file  . Number of children: 4  . Years of education: Not on file  . Highest education level: Not on file  Occupational History  . 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 Financial resource strain: Not on file  . Food insecurity:    Worry: Not on file    Inability: Not on file  . Transportation needs:    Medical: Not on file    Non-medical: Not on file  Tobacco Use  . Smoking status: Never Smoker  . Smokeless tobacco: Never Used  Substance and Sexual Activity  . Alcohol use: Yes    Alcohol/week: 2.5 oz    Types: 5 Standard drinks or equivalent per week    Comment: 1 glass of wine 4-5 times week.  . Drug use: No  . Sexual activity: Yes    Partners: Male    Birth control/protection: Surgical    Comment: HYST  Lifestyle  . Physical activity:    Days per week: Not on file    Minutes per session: Not on file  . Stress: Not on file  Relationships  . Social connections:    Talks on phone: Not on file    Gets together: Not on file    Attends religious service: Not on file    Active member of club or organization: Not on file    Attends meetings of clubs or organizations: Not on file    Relationship status: Not on file  Other Topics Concern  . Not on file  Social History Narrative   Lives with her husband, 2 dogs, 2 birds.  She has boy/girl twins, and a daughter in college.  Joslyn Hy is married, has 2 granddaughters     FAMILY HISTORY:  We obtained a detailed, 4-generation family history.  Significant diagnoses are listed below: Family History  Problem Relation Age of Onset  . Diabetes Father   . Alcohol abuse Father   . Depression Father   . Ulcers Father   . Stomach cancer Father 71       treated with partial gastrectomy. Died of other causes at 39.  . Diabetes Maternal Grandmother   . Hypertension Maternal Grandmother   . Heart attack Maternal  Grandmother   . Heart disease Maternal Grandmother   . Hypothyroidism Maternal Grandmother   . Melanoma Maternal Grandmother 60       d.90  . Hypertension Maternal Grandfather   . Stroke Maternal Grandfather   . Asthma Maternal Grandfather   . Colon polyps Maternal Grandfather   . Heart attack Paternal Grandfather   . Heart disease Paternal Grandfather 22       died of MI  . Arthritis Mother   . Hypothyroidism Mother   . Diabetes Brother   .  Breast cancer Paternal Aunt 55       d.70s  . Rectal cancer Cousin 40       d.48s Daughter of unaffected maternal uncle.    Kristen Gamble has one biological daughter who is 97 without cancers or tumors. Her biological son died at age 3.5 from myocarditis. Kristen Gamble also has an adopted son and daughter who are twins, age 54. Kristen Gamble has one brother, age 45 without cancers.  Kristen Gamble father died at age 35 from complications associated with alcohol abuse. He previously had a history of stomach cancer in his 5's which was treated with partial gastrectomy. Kristen Gamble father had 4 sisters and 2 brothers. One sister had breast cancer in her 46's and died in her 47's. Another sister died without cancers in her 39's. The other 2 sisters are in their 62's without cancers. Kristen Gamble paternal grandmother died at 25 without cancer. Her paternal grandfather died in his 40's from a heart attack.  Kristen Gamble mother is 56 without cancers. She underwent TAH/BSO around age 30 for reasons unrelated to cancer. Kristen Gamble mother has 2 brothers and a sister. One brother died at 15 without cancers. The other brother is 42 without cancers. This brother has a daughter who died in her 42's from rectal cancer. Kristen Gamble mother's sister is 63 without cancers. Kristen Gamble maternal grandmother had melanoma in her 54's and died at 20. Her maternal grandfather died in his 67's and had a history of colon polyps.  Kristen Gamble is unaware of previous family history of genetic testing for hereditary  cancer risks besides her own. Patient's maternal ancestors are of Native [place] and [place] descent, and paternal ancestors are of [place] descent. There is no reported Ashkenazi Jewish ancestry. There is no known consanguinity.  GENETIC COUNSELING ASSESSMENT: Kristen Gamble is a 57 y.o. female with a personal and family history of cancer which is somewhat suggestive of a hereditary cancer syndrome and predisposition to cancer. We, therefore, discussed and recommended the following at today's visit.   DISCUSSION: We discussed that about 5-10% of breast cancer is hereditary with most cases due to BRCA mutations.  She had tested negative for BRCA mutations in the past, but should undergo genetic testing again, as there are mutations that were missed with our technology at that time.  It is estimated that about 7-10% of patients who negative in the past may have a BRCA mutation.  Other genes can be part of hereditary breast cancer syndromes, including ATM, CHEK2 and PALB2.  We reviewed the characteristics, features and inheritance patterns of hereditary cancer syndromes. We also discussed genetic testing, including the appropriate family members to test, the process of testing, insurance coverage and turn-around-time for results. We discussed the implications of a negative, positive and/or variant of uncertain significant result. We recommended Kristen Gamble pursue genetic testing for the multi cancer gene panel. The Multi-Gene Panel offered by [facility] includes sequencing and/or deletion duplication testing of the following 83 genes: ALK, APC, ATM, AXIN2,BAP1,  BARD1, BLM, BMPR1A, BRCA1, BRCA2, BRIP1, CASR, CDC73, CDH1, CDK4, CDKN1B, CDKN1C, CDKN2A (p14ARF), CDKN2A (p16INK4a), CEBPA, CHEK2, CTNNA1, DICER1, DIS3L2, EGFR (c.2369C>T, p.Thr790Met variant only), EPCAM (Deletion/duplication testing only), FH, FLCN, GATA2, GPC3, GREM1 (Promoter region deletion/duplication testing only), HOXB13 (c.251G>A, p.Gly84Glu), HRAS,  KIT, MAX, MEN1, MET, MITF (c.952G>A, p.Glu318Lys variant only), MLH1, MSH2, MSH3, MSH6, MUTYH, NBN, NF1, NF2, NTHL1, PALB2, PDGFRA, PHOX2B, PMS2, POLD1, POLE, POT1, PRKAR1A, PTCH1, PTEN, RAD50, RAD51C, RAD51D, RB1, RECQL4, RET, RUNX1, SDHAF2,  SDHA (sequence changes only), SDHB, SDHC, SDHD, SMAD4, SMARCA4, SMARCB1, SMARCE1, STK11, SUFU, TERT, TERT, TMEM127, TP53, TSC1, TSC2, VHL, WRN and WT1.    Based on Kristen Gamble. [place] personal and family history of cancer, she meets medical criteria for genetic testing. Despite that she meets criteria, she may still have an out of pocket cost. We discussed that if her out of pocket cost for testing is over $100, the laboratory will call and confirm whether she wants to proceed with testing.  If the out of pocket cost of testing is less than $100 she will be billed by the genetic testing laboratory.   PLAN: After considering the risks, benefits, and limitations, Kristen Gamble  provided informed consent to pursue genetic testing and the blood sample was sent to [facility] for analysis of the multi-cancer panel. Results should be available within approximately 2-3 weeks' time, at which point they will be disclosed by telephone to Kristen Gamble, as will any additional recommendations warranted by these results. Kristen Gamble. [place] will receive a summary of her genetic counseling visit and a copy of her results once available. This information will also be available in Epic. We encouraged Kristen Gamble. [place] to remain in contact with cancer genetics annually so that we can continuously update the family history and inform her of any changes in cancer genetics and testing that may be of benefit for her family. Kristen Gamble. [place] questions were answered to her satisfaction today. Our contact information was provided should additional questions or concerns arise.  Lastly, we encouraged Kristen Gamble. [place] to remain in contact with cancer genetics annually so that we can continuously update the family history and inform her of any  changes in cancer genetics and testing that may be of benefit for this family.   Kristen Gamble.  [place] questions were answered to her satisfaction today. Our contact information was provided should additional questions or concerns arise. Thank you for the referral and allowing [place] to share in the care of your patient.   Karen P. Florene Glen, [place], [facility] Certified Genetic Counselor [email or web address] phone: [phone]  The patient was seen for a total of 30 minutes in face-to-face genetic counseling.  This patient was discussed with Drs. Magrinat, Lindi Adie and/or Burr Medico who agrees with the above.    _______________________________________________________________________ For Office Staff:  Number of people involved in session: 1 Was an Intern/ student involved with case: no

## 2017-10-08 ENCOUNTER — Telehealth: Payer: Self-pay

## 2017-10-08 ENCOUNTER — Other Ambulatory Visit: Payer: Self-pay

## 2017-10-08 DIAGNOSIS — Z978 Presence of other specified devices: Secondary | ICD-10-CM

## 2017-10-08 NOTE — Telephone Encounter (Signed)
Due to cost of breast MRI Kristen Gamble would like to know where else she may go to have this done. I suggested for her to call [facility] to see if cost would be any less for her. She is going to call them and see what they say. If able to complete at GI then she will cancel at [facility] Call back number given for any further questions.  Cyndia Bent RN

## 2017-10-08 NOTE — Progress Notes (Signed)
Patient has decided to receive imaging at [place] at [facility] due to insurance and payment options. I have faxed an order to that office. She is aware, she can schedule at appointment at her convenience.  Cyndia Bent RN

## 2017-10-09 ENCOUNTER — Ambulatory Visit (HOSPITAL_COMMUNITY): Admission: RE | Admit: 2017-10-09 | Payer: BLUE CROSS/BLUE SHIELD | Source: Ambulatory Visit

## 2017-10-10 ENCOUNTER — Other Ambulatory Visit: Payer: Self-pay

## 2017-10-10 ENCOUNTER — Telehealth: Payer: Self-pay

## 2017-10-10 DIAGNOSIS — Z978 Presence of other specified devices: Secondary | ICD-10-CM

## 2017-10-10 NOTE — Telephone Encounter (Signed)
Re-faxed patients breast MRI orders to [facility] again. Faxed on Tuesday 3/26 and received confirmation. Also faxed most recent office note and face sheet. Informed pt this was re-faxed.  Cyndia Bent RN

## 2017-10-10 NOTE — Progress Notes (Signed)
[facility] called informing us that if an MRI was to assess a breast implant rupture then in would need to done without contrast. A w/wo contrast could not be done because the contrast could affect the area if a rupture was there. New orders placed and faxed to [facility].  Cyndia Bent RN

## 2017-10-15 ENCOUNTER — Telehealth: Payer: Self-pay | Admitting: Genetic Counselor

## 2017-10-15 ENCOUNTER — Ambulatory Visit: Payer: Self-pay | Admitting: Genetic Counselor

## 2017-10-15 ENCOUNTER — Encounter: Payer: Self-pay | Admitting: Genetic Counselor

## 2017-10-15 DIAGNOSIS — C50912 Malignant neoplasm of unspecified site of left female breast: Secondary | ICD-10-CM

## 2017-10-15 DIAGNOSIS — Z808 Family history of malignant neoplasm of other organs or systems: Secondary | ICD-10-CM

## 2017-10-15 DIAGNOSIS — Z800 Family history of malignant neoplasm of digestive organs: Secondary | ICD-10-CM

## 2017-10-15 DIAGNOSIS — Z803 Family history of malignant neoplasm of breast: Secondary | ICD-10-CM

## 2017-10-15 DIAGNOSIS — Z1379 Encounter for other screening for genetic and chromosomal anomalies: Secondary | ICD-10-CM | POA: Insufficient documentation

## 2017-10-15 DIAGNOSIS — Z170 Estrogen receptor positive status [ER+]: Secondary | ICD-10-CM

## 2017-10-15 NOTE — Progress Notes (Signed)
HPI:  Kristen Gamble was previously seen in the [place] [facility] due to a personal and family history of cancer and concerns regarding a hereditary predisposition to cancer. Please refer to our prior cancer genetics clinic note for more information regarding Kristen Gamble's medical, social and family histories, and our assessment and recommendations, at the time. Kristen Gamble recent genetic test results were disclosed to her, as were recommendations warranted by these results. These results and recommendations are discussed in more detail below.  CANCER HISTORY:    Breast cancer, left breast ([place])   01/2007 Definitive Surgery    Bilateral mastectomies with subpectoral implant placement: IDC, grade 1, ER/PR+, HER2/neu negative      01/2007 Pathologic Stage    Stage IA: T1c N0       01/2007 Oncotype testing    RS 13 (8% ROR)      02/2007 - 01/2014 Anti-estrogen oral therapy    Tamoxifen 20 mg then transitioned to letrzole with lupron then tamoxifen again (11/09) then pt discontinued 06/2010 and resumed 01/2011, ultimately discontinued 01/2014      10/14/2017 Genetic Testing    Negative genetic testing on the multi-cancer panel.  The Multi-Gene Panel offered by [facility] includes sequencing and/or deletion duplication testing of the following 83 genes: ALK, APC, ATM, AXIN2,BAP1,  BARD1, BLM, BMPR1A, BRCA1, BRCA2, BRIP1, CASR, CDC73, CDH1, CDK4, CDKN1B, CDKN1C, CDKN2A (p14ARF), CDKN2A (p16INK4a), CEBPA, CHEK2, CTNNA1, DICER1, DIS3L2, EGFR (c.2369C>T, p.Thr790Met variant only), EPCAM (Deletion/duplication testing only), FH, FLCN, GATA2, GPC3, GREM1 (Promoter region deletion/duplication testing only), HOXB13 (c.251G>A, p.Gly84Glu), HRAS, KIT, MAX, MEN1, MET, MITF (c.952G>A, p.Glu318Lys variant only), MLH1, MSH2, MSH3, MSH6, MUTYH, NBN, NF1, NF2, NTHL1, PALB2, PDGFRA, PHOX2B, PMS2, POLD1, POLE, POT1, PRKAR1A, PTCH1, PTEN, RAD50, RAD51C, RAD51D, RB1, RECQL4, RET, RUNX1, SDHAF2, SDHA (sequence changes only),  SDHB, SDHC, SDHD, SMAD4, SMARCA4, SMARCB1, SMARCE1, STK11, SUFU, TERT, TERT, TMEM127, TP53, TSC1, TSC2, VHL, WRN and WT1.  The report date is October 14, 2017.        FAMILY HISTORY:  We obtained a detailed, 4-generation family history.  Significant diagnoses are listed below: Family History  Problem Relation Age of Onset  . Diabetes Father   . Alcohol abuse Father   . Depression Father   . Ulcers Father   . Stomach cancer Father 18       treated with partial gastrectomy. Died of other causes at 26.  . Diabetes Maternal Grandmother   . Hypertension Maternal Grandmother   . Heart attack Maternal Grandmother   . Heart disease Maternal Grandmother   . Hypothyroidism Maternal Grandmother   . Melanoma Maternal Grandmother 60       d.90  . Hypertension Maternal Grandfather   . Stroke Maternal Grandfather   . Asthma Maternal Grandfather   . Colon polyps Maternal Grandfather   . Heart attack Paternal Grandfather   . Heart disease Paternal Grandfather 66       died of MI  . Arthritis Mother   . Hypothyroidism Mother   . Diabetes Brother   . Breast cancer Paternal Aunt 25       d.70s  . Rectal cancer Cousin 40       d.22s Daughter of unaffected maternal uncle.    Kristen Gamble has one biological daughter who is 38 without cancers or tumors. Her biological son died at age 76.5 from myocarditis. Kristen Gamble also has an adopted son and daughter who are twins, age 81. Kristen Gamble has one brother, age 77 without cancers.  Kristen Gamble father died at age 65 from complications associated with alcohol abuse. He previously had a history of stomach cancer in his 67's which was treated with partial gastrectomy. Kristen Gamble father had 4 sisters and 2 brothers. One sister had breast cancer in her 62's and died in her 42's. Another sister died without cancers in her 38's. The other 2 sisters are in their 82's without cancers. Kristen Gamble paternal grandmother died at 8 without cancer. Her paternal grandfather died in his  33's from a heart attack.  Kristen Gamble mother is 22 without cancers. She underwent TAH/BSO around age 74 for reasons unrelated to cancer. Kristen Gamble mother has 2 brothers and a sister. One brother died at 30 without cancers. The other brother is 12 without cancers. This brother has a daughter who died in her 75's from rectal cancer. Kristen Gamble mother's sister is 58 without cancers. Kristen Gamble maternal grandmother had melanoma in her 14's and died at 5. Her maternal grandfather died in his 37's and had a history of colon polyps.  Kristen unawareof previous family history of genetic testing for hereditary cancer risksbesides her own. Patient's maternal ancestors are ofNative Research scientist (medical), and paternal ancestors are of Interior and spatial designer. There is noreported Ashkenazi Jewish ancestry. There is noknown consanguinity.  GENETIC TEST RESULTS: Genetic testing reported out on October 14, 2017 through the Multi-cancer panel found no deleterious mutations.  The Multi-Gene Panel offered by [facility] includes sequencing and/or deletion duplication testing of the following 83 genes: ALK, APC, ATM, AXIN2,BAP1,  BARD1, BLM, BMPR1A, BRCA1, BRCA2, BRIP1, CASR, CDC73, CDH1, CDK4, CDKN1B, CDKN1C, CDKN2A (p14ARF), CDKN2A (p16INK4a), CEBPA, CHEK2, CTNNA1, DICER1, DIS3L2, EGFR (c.2369C>T, p.Thr790Met variant only), EPCAM (Deletion/duplication testing only), FH, FLCN, GATA2, GPC3, GREM1 (Promoter region deletion/duplication testing only), HOXB13 (c.251G>A, p.Gly84Glu), HRAS, KIT, MAX, MEN1, MET, MITF (c.952G>A, p.Glu318Lys variant only), MLH1, MSH2, MSH3, MSH6, MUTYH, NBN, NF1, NF2, NTHL1, PALB2, PDGFRA, PHOX2B, PMS2, POLD1, POLE, POT1, PRKAR1A, PTCH1, PTEN, RAD50, RAD51C, RAD51D, RB1, RECQL4, RET, RUNX1, SDHAF2, SDHA (sequence changes only), SDHB, SDHC, SDHD, SMAD4, SMARCA4, SMARCB1, SMARCE1, STK11, SUFU, TERT, TERT, TMEM127, TP53, TSC1, TSC2, VHL, WRN and WT1.  The test report has been scanned into EPIC and is  located under the Molecular Pathology section of the Results Review tab.    We discussed with Kristen Gamble that since the current genetic testing is not perfect, it is possible there may be a gene mutation in one of these genes that current testing cannot detect, but that chance is small.  We also discussed, that it is possible that another gene that has not yet been discovered, or that we have not yet tested, is responsible for the cancer diagnoses in the family, and it is, therefore, important to remain in touch with cancer genetics in the future so that we can continue to offer Ms. Colquhoun the most up to date genetic testing.    CANCER SCREENING RECOMMENDATIONS:  This result is reassuring and indicates that Ms. Hauser likely does not have an increased risk for a future cancer due to a mutation in one of these genes. This normal test also suggests that Ms. Beane's cancer was most likely not due to an inherited predisposition associated with one of these genes.  Most cancers happen by chance and this negative test suggests that her cancer falls into this category.  We, therefore, recommended she continue to follow the cancer management and screening guidelines provided by her oncology and primary healthcare provider.   An individual's cancer risk  and medical management are not determined by genetic test results alone. Overall cancer risk assessment incorporates additional factors, including personal medical history, family history, and any available genetic information that may result in a personalized plan for cancer prevention and surveillance.  RECOMMENDATIONS FOR FAMILY MEMBERS:  Women in this family might be at some increased risk of developing cancer, over the general population risk, simply due to the family history of cancer.  We recommended women in this family have a yearly mammogram beginning at age 81, or 17 years younger than the earliest onset of cancer, an annual clinical breast exam, and perform monthly  breast self-exams. Women in this family should also have a gynecological exam as recommended by their primary provider. All family members should have a colonoscopy by age 58.  FOLLOW-UP: Lastly, we discussed with Ms. Rinck that cancer genetics is a rapidly advancing field and it is possible that new genetic tests will be appropriate for her and/or her family members in the future. We encouraged her to remain in contact with cancer genetics on an annual basis so we can update her personal and family histories and let her know of advances in cancer genetics that may benefit this family.   Our contact number was provided. Ms. Cisse questions were answered to her satisfaction, and she knows she is welcome to call us at anytime with additional questions or concerns.   Roma Kayser, MS, [facility] Certified Genetic Counselor [email or web address]

## 2017-10-15 NOTE — Telephone Encounter (Signed)
Revealed negative genetic testing.  Discussed that we do not know why she has breast cancer or why there is cancer in the family. It could be due to a different gene that we are not testing, or maybe our current technology may not be able to pick something up.  It will be important for her to keep in contact with genetics to keep up with whether additional testing may be needed. 

## 2017-10-21 ENCOUNTER — Telehealth: Payer: Self-pay | Admitting: *Deleted

## 2017-10-21 DIAGNOSIS — T8549XA Other mechanical complication of breast prosthesis and implant, initial encounter: Secondary | ICD-10-CM | POA: Diagnosis not present

## 2017-10-21 DIAGNOSIS — Z853 Personal history of malignant neoplasm of breast: Secondary | ICD-10-CM | POA: Diagnosis not present

## 2017-10-21 NOTE — Telephone Encounter (Signed)
Spoke with pt , and was informed that pt had MRI breast done today.  Wanted to let Ria Comment, NP survivorship provider know. Pt would like to know if NP has any further recommendations, and when pt needs to follow up. Pt's    Phone      [phone].

## 2017-10-22 NOTE — Telephone Encounter (Signed)
Can you see if results are available?  I think she may have had it at [facility]?

## 2017-10-24 ENCOUNTER — Telehealth: Payer: Self-pay | Admitting: *Deleted

## 2017-10-24 NOTE — Telephone Encounter (Signed)
Patient left VM requesting results of breast MRI performed this past Monday.  This RN obtained results of MRI from [facility] on [address] in [place].  Called pt at [phone] and informed her of bilateral intracapsular ruptures.  Above discussed including need for follow up with plastic surgeon.  Kristen Gamble states she had reconstruction done under Dr Leeann Must at [facility].  Plan per call is results and most recent dictation will be faxed to Dr Stasia Cavalier Butch Penny will call to schedule an appointment.  This RN called Dr Kandis Ban at [phone] and obtained fax number of [phone] and verified pt may call for an appointment without a referral from this office.  This note will be forwarded to MD and [facility]/NP for communication.

## 2017-10-27 ENCOUNTER — Other Ambulatory Visit: Payer: Self-pay | Admitting: Oncology

## 2017-10-27 NOTE — Progress Notes (Signed)
Kristen Gamble has had a implant ruptures and she has been referred back to [facility] for further evaluation and treatment

## 2017-11-05 DIAGNOSIS — Z853 Personal history of malignant neoplasm of breast: Secondary | ICD-10-CM | POA: Diagnosis not present

## 2017-11-05 DIAGNOSIS — Z9889 Other specified postprocedural states: Secondary | ICD-10-CM | POA: Diagnosis not present

## 2017-11-05 DIAGNOSIS — Z9013 Acquired absence of bilateral breasts and nipples: Secondary | ICD-10-CM | POA: Diagnosis not present

## 2017-11-07 DIAGNOSIS — E559 Vitamin D deficiency, unspecified: Secondary | ICD-10-CM | POA: Diagnosis not present

## 2017-11-07 DIAGNOSIS — R5383 Other fatigue: Secondary | ICD-10-CM | POA: Diagnosis not present

## 2017-11-07 DIAGNOSIS — E039 Hypothyroidism, unspecified: Secondary | ICD-10-CM | POA: Diagnosis not present

## 2017-11-28 DIAGNOSIS — E039 Hypothyroidism, unspecified: Secondary | ICD-10-CM | POA: Diagnosis not present

## 2017-11-28 DIAGNOSIS — R5383 Other fatigue: Secondary | ICD-10-CM | POA: Diagnosis not present

## 2017-11-28 DIAGNOSIS — E2740 Unspecified adrenocortical insufficiency: Secondary | ICD-10-CM | POA: Diagnosis not present

## 2017-11-28 DIAGNOSIS — K5900 Constipation, unspecified: Secondary | ICD-10-CM | POA: Diagnosis not present

## 2017-12-02 DIAGNOSIS — H16202 Unspecified keratoconjunctivitis, left eye: Secondary | ICD-10-CM | POA: Diagnosis not present

## 2017-12-03 DIAGNOSIS — D2372 Other benign neoplasm of skin of left lower limb, including hip: Secondary | ICD-10-CM | POA: Diagnosis not present

## 2017-12-03 DIAGNOSIS — D2371 Other benign neoplasm of skin of right lower limb, including hip: Secondary | ICD-10-CM | POA: Diagnosis not present

## 2017-12-03 DIAGNOSIS — D485 Neoplasm of uncertain behavior of skin: Secondary | ICD-10-CM | POA: Diagnosis not present

## 2017-12-03 DIAGNOSIS — D1801 Hemangioma of skin and subcutaneous tissue: Secondary | ICD-10-CM | POA: Diagnosis not present

## 2017-12-03 DIAGNOSIS — L821 Other seborrheic keratosis: Secondary | ICD-10-CM | POA: Diagnosis not present

## 2017-12-16 DIAGNOSIS — Z79899 Other long term (current) drug therapy: Secondary | ICD-10-CM | POA: Diagnosis not present

## 2017-12-16 DIAGNOSIS — Z9013 Acquired absence of bilateral breasts and nipples: Secondary | ICD-10-CM | POA: Diagnosis not present

## 2017-12-16 DIAGNOSIS — T8543XA Leakage of breast prosthesis and implant, initial encounter: Secondary | ICD-10-CM | POA: Diagnosis not present

## 2017-12-16 DIAGNOSIS — Y812 Prosthetic and other implants, materials and accessory general- and plastic-surgery devices associated with adverse incidents: Secondary | ICD-10-CM | POA: Diagnosis not present

## 2017-12-16 DIAGNOSIS — T8543XS Leakage of breast prosthesis and implant, sequela: Secondary | ICD-10-CM | POA: Diagnosis not present

## 2017-12-16 DIAGNOSIS — Z7951 Long term (current) use of inhaled steroids: Secondary | ICD-10-CM | POA: Diagnosis not present

## 2017-12-16 DIAGNOSIS — Y838 Other surgical procedures as the cause of abnormal reaction of the patient, or of later complication, without mention of misadventure at the time of the procedure: Secondary | ICD-10-CM | POA: Diagnosis not present

## 2017-12-16 DIAGNOSIS — E039 Hypothyroidism, unspecified: Secondary | ICD-10-CM | POA: Diagnosis not present

## 2017-12-16 DIAGNOSIS — Z853 Personal history of malignant neoplasm of breast: Secondary | ICD-10-CM | POA: Diagnosis not present

## 2017-12-24 ENCOUNTER — Encounter: Payer: Self-pay | Admitting: Family Medicine

## 2017-12-24 DIAGNOSIS — Z853 Personal history of malignant neoplasm of breast: Secondary | ICD-10-CM | POA: Diagnosis not present

## 2017-12-24 DIAGNOSIS — Z9889 Other specified postprocedural states: Secondary | ICD-10-CM | POA: Diagnosis not present

## 2017-12-24 DIAGNOSIS — Z9013 Acquired absence of bilateral breasts and nipples: Secondary | ICD-10-CM | POA: Diagnosis not present

## 2018-04-10 ENCOUNTER — Encounter: Payer: Self-pay | Admitting: Family Medicine

## 2018-04-10 ENCOUNTER — Ambulatory Visit (INDEPENDENT_AMBULATORY_CARE_PROVIDER_SITE_OTHER): Payer: BLUE CROSS/BLUE SHIELD | Admitting: Family Medicine

## 2018-04-10 VITALS — BP 106/72 | HR 64 | Temp 97.0°F | Ht 63.5 in | Wt 124.2 lb

## 2018-04-10 DIAGNOSIS — R350 Frequency of micturition: Secondary | ICD-10-CM

## 2018-04-10 DIAGNOSIS — F321 Major depressive disorder, single episode, moderate: Secondary | ICD-10-CM | POA: Diagnosis not present

## 2018-04-10 DIAGNOSIS — R300 Dysuria: Secondary | ICD-10-CM

## 2018-04-10 DIAGNOSIS — Z23 Encounter for immunization: Secondary | ICD-10-CM

## 2018-04-10 DIAGNOSIS — N3000 Acute cystitis without hematuria: Secondary | ICD-10-CM | POA: Diagnosis not present

## 2018-04-10 LAB — POCT URINALYSIS DIP (PROADVANTAGE DEVICE)
BILIRUBIN UA: NEGATIVE mg/dL
Bilirubin, UA: NEGATIVE
Glucose, UA: NEGATIVE mg/dL
Nitrite, UA: NEGATIVE
PH UA: 6 (ref 5.0–8.0)
PROTEIN UA: NEGATIVE mg/dL
SPECIFIC GRAVITY, URINE: 1.02
Urobilinogen, Ur: NEGATIVE

## 2018-04-10 MED ORDER — NITROFURANTOIN MONOHYD MACRO 100 MG PO CAPS
100.0000 mg | ORAL_CAPSULE | Freq: Two times a day (BID) | ORAL | 0 refills | Status: DC
Start: 2018-04-10 — End: 2018-09-05

## 2018-04-10 NOTE — Progress Notes (Signed)
Chief Complaint  Patient presents with  . Urinary Frequency    and burning as well as lbp x 1 day.    Woke up 3-4am this morning with urinary urgency, felt uncomfortable, some burning.  Throughout the day she continues to have urgency and frequency.  Having some discomfort in the low back.  It has been a little better this afternoon, was worse this morning. Drank cranberry juice this morning, pushing water all day, and took AZO-cranberry (didn't turn urine orange, not affecting sample given), took it this morning.  No fever, chills, nausea, vomiting, blank pain. Pain is in lower back, bilaterally. H/o UTI's, none in a few years. Also h/o kidney stones.  Has had a lot going on this past year--Had breast implants removed, they had ruptured. Her son has had issues with marijuana and alcohol use.  He has gone to rehab (he is 21), and is current in a sober living environment in [place]. She is struggling some, has an appointment with Dr. Clovis Pu today. Has been to [place].  Affecting whole family.  PMH, PSH, SH reviewed Current Outpatient Medications on File Prior to Visit  Medication Sig Dispense Refill  . cetirizine (ZYRTEC) 10 MG chewable tablet Chew 10 mg by mouth daily.    . Cranberry-Vitamin C-Probiotic (AZO CRANBERRY PO) Take 1 tablet by mouth as needed.    . fluticasone (FLONASE) 50 MCG/ACT nasal spray Place 2 sprays into both nostrils daily. 16 g 11  . L-Methylfolate-Algae (DEPLIN 15 PO) Take by mouth.    . Multiple Vitamins-Minerals (MULTIVITAMIN WITH MINERALS) tablet Take 1 tablet daily by mouth.    . polyethylene glycol (MIRALAX / GLYCOLAX) packet Take 17 g by mouth daily as needed.    . Thyroid 48.75 MG TABS Take 1 tablet by mouth daily.    . traZODone (DESYREL) 100 MG tablet Take 150 mg by mouth.  0  . vitamin B-12 (CYANOCOBALAMIN) 100 MCG tablet Take 100 mcg by mouth daily.    Marland Kitchen guaiFENesin (MUCINEX) 600 MG 12 hr tablet Take 600 mg by mouth 2 (two) times daily.    . phenylephrine  (SUDAFED PE) 10 MG TABS tablet Take 10 mg by mouth every 4 (four) hours as needed.     No current facility-administered medications on file prior to visit.    Allergies  Allergen Reactions  . Morphine Itching    REACTION: itching REACTION: itching   ROS: as reported above, see HPI. No URI symptoms, chest pain, shortness of breath. No bleeding, bruising, rash.  +mood changes/stress. +urinary complaints per HPI  PHYSICAL EXAM:  BP 106/72   Pulse 64   Temp (!) 97 F (36.1 C) (Tympanic)   Ht 5' 3.5" (1.613 m)   Wt 124 lb 3.2 oz (56.3 kg)   BMI 21.66 kg/m   Well appearing, pleasant female, in no distress Neck: no lymphadenopathy or mass Heart: regular rate and rhythm Lungs: clear bilaterally Back: no CVA tenderness Abdomen: soft, minimally tender in lower abdomen, slightly more on both sides than centrally. 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 Strongly encouraged counseling. F/u with  psych as scheduled for today. Continue al-anon and support groups.   Continue to drink plenty of water, and either cranberry juice vs extract tablets. Don't use AZO for longer than 2 days. If you continue to feel a little better today, you may wish to hold off until tomorrow morning. If you have persistent symptoms, then start the antibiotic. We will be in touch with the culture results within 2-4 days.

## 2018-04-10 NOTE — Patient Instructions (Addendum)
  Continue to drink plenty of water, and either cranberry juice vs extract tablets. Don't use AZO for longer than 2 days. If you continue to feel a little better today, you may wish to hold off until tomorrow morning. If you have persistent symptoms, then start the antibiotic. We will be in touch with the culture results within 2-4 days.  Counseling is a good idea. Dr. Clovis Pu may have recommendations for therapists. I like Marya Amsler and the other folks at [facility] ([place]).

## 2018-04-11 LAB — URINE CULTURE

## 2018-04-12 ENCOUNTER — Encounter: Payer: Self-pay | Admitting: Family Medicine

## 2018-05-21 DIAGNOSIS — R5383 Other fatigue: Secondary | ICD-10-CM | POA: Diagnosis not present

## 2018-05-21 DIAGNOSIS — E559 Vitamin D deficiency, unspecified: Secondary | ICD-10-CM | POA: Diagnosis not present

## 2018-05-21 DIAGNOSIS — E039 Hypothyroidism, unspecified: Secondary | ICD-10-CM | POA: Diagnosis not present

## 2018-05-30 DIAGNOSIS — R5383 Other fatigue: Secondary | ICD-10-CM | POA: Diagnosis not present

## 2018-05-30 DIAGNOSIS — E2740 Unspecified adrenocortical insufficiency: Secondary | ICD-10-CM | POA: Diagnosis not present

## 2018-05-30 DIAGNOSIS — Z853 Personal history of malignant neoplasm of breast: Secondary | ICD-10-CM | POA: Diagnosis not present

## 2018-05-30 DIAGNOSIS — E039 Hypothyroidism, unspecified: Secondary | ICD-10-CM | POA: Diagnosis not present

## 2018-06-17 DIAGNOSIS — Z01419 Encounter for gynecological examination (general) (routine) without abnormal findings: Secondary | ICD-10-CM | POA: Diagnosis not present

## 2018-06-17 DIAGNOSIS — Z6821 Body mass index (BMI) 21.0-21.9, adult: Secondary | ICD-10-CM | POA: Diagnosis not present

## 2018-06-17 DIAGNOSIS — Z1212 Encounter for screening for malignant neoplasm of rectum: Secondary | ICD-10-CM | POA: Diagnosis not present

## 2018-06-20 DIAGNOSIS — F438 Other reactions to severe stress: Secondary | ICD-10-CM | POA: Diagnosis not present

## 2018-06-20 DIAGNOSIS — F4323 Adjustment disorder with mixed anxiety and depressed mood: Secondary | ICD-10-CM | POA: Diagnosis not present

## 2018-06-20 DIAGNOSIS — F332 Major depressive disorder, recurrent severe without psychotic features: Secondary | ICD-10-CM | POA: Diagnosis not present

## 2018-06-30 ENCOUNTER — Other Ambulatory Visit: Payer: Self-pay

## 2018-06-30 ENCOUNTER — Telehealth: Payer: Self-pay | Admitting: Psychiatry

## 2018-06-30 MED ORDER — TRAZODONE HCL 100 MG PO TABS: 200.0000 mg | ORAL_TABLET | Freq: Every day | ORAL | 0 refills | Status: DC

## 2018-06-30 NOTE — Telephone Encounter (Signed)
Wants refill of Trazadone 100mg  unil office vist on 07/23/2018. [facility] on [address] [phone]

## 2018-06-30 NOTE — Telephone Encounter (Signed)
rx sent to pharmacy

## 2018-07-03 DIAGNOSIS — F438 Other reactions to severe stress: Secondary | ICD-10-CM | POA: Diagnosis not present

## 2018-07-03 DIAGNOSIS — F332 Major depressive disorder, recurrent severe without psychotic features: Secondary | ICD-10-CM | POA: Diagnosis not present

## 2018-07-13 ENCOUNTER — Encounter: Payer: Self-pay | Admitting: Emergency Medicine

## 2018-07-13 DIAGNOSIS — G4700 Insomnia, unspecified: Secondary | ICD-10-CM

## 2018-07-13 DIAGNOSIS — F411 Generalized anxiety disorder: Secondary | ICD-10-CM

## 2018-07-13 DIAGNOSIS — F410 Panic disorder [episodic paroxysmal anxiety] without agoraphobia: Secondary | ICD-10-CM

## 2018-07-17 DIAGNOSIS — F332 Major depressive disorder, recurrent severe without psychotic features: Secondary | ICD-10-CM | POA: Diagnosis not present

## 2018-07-17 DIAGNOSIS — F438 Other reactions to severe stress: Secondary | ICD-10-CM | POA: Diagnosis not present

## 2018-07-23 ENCOUNTER — Ambulatory Visit (INDEPENDENT_AMBULATORY_CARE_PROVIDER_SITE_OTHER): Payer: BLUE CROSS/BLUE SHIELD | Admitting: Psychiatry

## 2018-07-23 ENCOUNTER — Encounter: Payer: Self-pay | Admitting: Psychiatry

## 2018-07-23 DIAGNOSIS — F4001 Agoraphobia with panic disorder: Secondary | ICD-10-CM

## 2018-07-23 DIAGNOSIS — F331 Major depressive disorder, recurrent, moderate: Secondary | ICD-10-CM

## 2018-07-23 DIAGNOSIS — F5105 Insomnia due to other mental disorder: Secondary | ICD-10-CM

## 2018-07-23 DIAGNOSIS — F411 Generalized anxiety disorder: Secondary | ICD-10-CM | POA: Diagnosis not present

## 2018-07-23 DIAGNOSIS — F900 Attention-deficit hyperactivity disorder, predominantly inattentive type: Secondary | ICD-10-CM

## 2018-07-23 NOTE — Progress Notes (Signed)
Kristen Gamble [id number] [birth year] 58 y.o.  Subjective:   Patient ID:  Kristen Gamble is a 58 y.o. (DOB July 25, 1960) female.  Chief Complaint:  Chief Complaint  Patient presents with  . Follow-up    Medication management    HPI last Kristen Gamble presents to the office today for follow-up of depression.  Depression is better with med changes and therapy and support group.  Depression is managed.  Exercise helps anxiety.  Patient reports stable mood and denies depressed or irritable moods.  Patient reports episodic difficulty with anxiety.  Patient denies difficulty with sleep initiation or maintenance. Denies appetite disturbance.  Patient reports that energy and motivation have been good.  Patient denies any difficulty with concentration.  Patient denies any suicidal ideation.  Sleep good.  Son in transitional living rehab in HS.  First visit home at Christmas.  Went ok.  More hopeful and secure and confident.  Seeing [name] in therapy and it's helpful.  Also attending [facility].  Father was also an alcoholic.  Prior psychiatric medications include methylphenidate, mirtazapine 45 mg, trazodone, Deplin, N-acetylcysteine.  She last took stimulants December 2018.  Review of Systems:  Review of Systems  Neurological: Negative for tremors and weakness.  Psychiatric/Behavioral: Negative for agitation, behavioral problems, confusion, decreased concentration, dysphoric mood, hallucinations, self-injury, sleep disturbance and suicidal ideas. The patient is not nervous/anxious and is not hyperactive.     Medications: I have reviewed the patient's current medications.  Current Outpatient Medications  Medication Sig Dispense Refill  . Acetylcysteine 600 MG CAPS Take 600 mg by mouth daily.    . cetirizine (ZYRTEC) 10 MG chewable tablet Chew 10 mg by mouth daily.    . cholecalciferol (VITAMIN D3) 25 MCG (1000 UT) tablet Take 1,000 Units by mouth daily.    . fluticasone (FLONASE) 50 MCG/ACT nasal spray  Place 2 sprays into both nostrils daily. 16 g 11  . guaiFENesin (MUCINEX) 600 MG 12 hr tablet Take 600 mg by mouth 2 (two) times daily.    Marland Kitchen L-Methylfolate-Algae (DEPLIN 15 PO) Take by mouth.    . Multiple Vitamins-Minerals (MULTIVITAMIN WITH MINERALS) tablet Take 1 tablet daily by mouth.    . phenylephrine (SUDAFED PE) 10 MG TABS tablet Take 10 mg by mouth every 4 (four) hours as needed.    . polyethylene glycol (MIRALAX / GLYCOLAX) packet Take 17 g by mouth daily as needed.    . Thyroid 48.75 MG TABS Take 1 tablet by mouth daily.    . traZODone (DESYREL) 100 MG tablet Take 2 tablets (200 mg total) by mouth at bedtime. 180 tablet 0  . vitamin B-12 (CYANOCOBALAMIN) 100 MCG tablet Take 100 mcg by mouth daily.    . Cranberry-Vitamin C-Probiotic (AZO CRANBERRY PO) Take 1 tablet by mouth as needed.    . methylphenidate (RITALIN) 20 MG tablet Take 20 mg by mouth every morning.    . nitrofurantoin, macrocrystal-monohydrate, (MACROBID) 100 MG capsule Take 1 capsule (100 mg total) by mouth 2 (two) times daily. (Patient not taking: Reported on 07/23/2018) 10 capsule 0   No current facility-administered medications for this visit.     Medication Side Effects: None  Allergies:  Allergies  Allergen Reactions  . Prednisone Palpitations  . Morphine Itching    REACTION: itching REACTION: itching    Past Medical History:  Diagnosis Date  . ADHD (attention deficit hyperactivity disorder) 08/2011  . Arthritis   . Breast cancer ([place]) 10/2006   left  . Complication of anesthesia  nausea  . Constipation   . Family history of breast cancer   . Family history of melanoma   . Family history of rectal cancer   . Hematuria   . History of kidney stones   . Nephrolithiasis    and stone distal R ureter on CT and R hydronephrosis  . Numbness    under l arm  . Osteopenia 05/2013   T score minus 1.7  . PONV (postoperative nausea and vomiting)   . TMJ (temporomandibular joint disorder)     Family  History  Problem Relation Age of Onset  . Diabetes Father   . Alcohol abuse Father   . Depression Father   . Ulcers Father   . Stomach cancer Father 18       treated with partial gastrectomy. Died of other causes at 81.  . Diabetes Maternal Grandmother   . Hypertension Maternal Grandmother   . Heart attack Maternal Grandmother   . Heart disease Maternal Grandmother   . Hypothyroidism Maternal Grandmother   . Melanoma Maternal Grandmother 60       d.90  . Hypertension Maternal Grandfather   . Stroke Maternal Grandfather   . Asthma Maternal Grandfather   . Colon polyps Maternal Grandfather   . Heart attack Paternal Grandfather   . Heart disease Paternal Grandfather 66       died of MI  . Arthritis Mother   . Hypothyroidism Mother   . Diabetes Brother   . Breast cancer Paternal Aunt 72       d.70s  . Rectal cancer Cousin 40       d.18s Daughter of unaffected maternal uncle.    Social History   Socioeconomic History  . Marital status: Married    Spouse name: Not on file  . Number of children: 4  . Years of education: Not on file  . Highest education level: Not on file  Occupational History  . 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 Financial resource strain: Not on file  . Food insecurity:    Worry: Not on file    Inability: Not on file  . Transportation needs:    Medical: Not on file    Non-medical: Not on file  Tobacco Use  . Smoking status: Never Smoker  . Smokeless tobacco: Never Used  Substance and Sexual Activity  . Alcohol use: Yes    Alcohol/week: 5.0 standard drinks    Types: 5 Standard drinks or equivalent per week    Comment: 1 glass of wine 4-5 times week.  . Drug use: No  . Sexual activity: Yes    Partners: Male    Birth control/protection: Surgical    Comment: HYST  Lifestyle  . Physical activity:    Days per week: Not on file    Minutes per session: Not on file  . Stress: Not on file  Relationships  . Social connections:     Talks on phone: Not on file    Gets together: Not on file    Attends religious service: Not on file    Active member of club or organization: Not on file    Attends meetings of clubs or organizations: Not on file    Relationship status: Not on file  . Intimate partner violence:    Fear of current or ex partner: Not on file    Emotionally abused: Not on file    Physically abused: Not on file    Forced sexual activity: Not  on file  Other Topics Concern  . Not on file  Social History Narrative   Lives with her husband, 2 dogs, 2 birds.  She has boy/girl twins, and a daughter who graduated from [employer] and is applying to med schools.  Joslyn Hy is married, has 2 granddaughters.   Son (41 yo) alcoholic; was sent to rehab, and in sober living environment in [place] 03/2018.    Past Medical History, Surgical history, Social history, and Family history were reviewed and updated as appropriate.   Please see review of systems for further details on the patient's review from today.   Enjoys to granddaughters 8 and 40 years old  Objective:   Physical Exam:  There were no vitals taken for this visit.  Physical Exam Constitutional:      General: She is not in acute distress.    Appearance: She is well-developed.  Musculoskeletal:        General: No deformity.  Neurological:     Mental Status: She is alert and oriented to person, place, and time.     Motor: No tremor.     Coordination: Coordination normal.     Gait: Gait normal.  Psychiatric:        Attention and Perception: Attention and perception normal.        Mood and Affect: Mood is not anxious or depressed. Affect is not labile, blunt, angry or inappropriate.        Speech: Speech normal.        Behavior: Behavior normal.        Thought Content: Thought content normal. Thought content does not include homicidal or suicidal ideation. Thought content does not include homicidal or suicidal plan.        Cognition and Memory: Cognition normal.         Judgment: Judgment normal.     Comments: Insight intact. No auditory or visual hallucinations. No delusions.      Lab Review:     Component Value Date/Time   NA 141 04/25/2016 1020   NA 141 02/02/2015 0805   K 3.6 04/25/2016 1020   K 4.1 02/02/2015 0805   CL 108 04/25/2016 1020   CO2 26 04/25/2016 1020   CO2 26 02/02/2015 0805   GLUCOSE 99 04/25/2016 1020   GLUCOSE 80 02/02/2015 0805   BUN 8 04/25/2016 1020   BUN 20.3 02/02/2015 0805   CREATININE 0.51 04/25/2016 1020   CREATININE 0.7 02/02/2015 0805   CALCIUM 9.2 04/25/2016 1020   CALCIUM 10.6 (H) 02/02/2015 0805   PROT 6.7 04/25/2016 1020   PROT 6.9 02/02/2015 0805   ALBUMIN 4.1 04/25/2016 1020   ALBUMIN 4.0 02/02/2015 0805   AST 22 04/25/2016 1020   AST 26 02/02/2015 0805   ALT 15 04/25/2016 1020   ALT 28 02/02/2015 0805   ALKPHOS 56 04/25/2016 1020   ALKPHOS 83 02/02/2015 0805   BILITOT 0.5 04/25/2016 1020   BILITOT 0.38 02/02/2015 0805   GFRNONAA >60 04/25/2016 1020   GFRAA >60 04/25/2016 1020       Component Value Date/Time   WBC 4.7 04/25/2016 1020   RBC 4.14 04/25/2016 1020   HGB 13.7 04/25/2016 1020   HGB 13.8 02/02/2015 0805   HCT 39.1 04/25/2016 1020   HCT 41.4 02/02/2015 0805   PLT 243 04/25/2016 1020   PLT 214 02/02/2015 0805   MCV 94.4 04/25/2016 1020   MCV 96.1 02/02/2015 0805   MCH 33.1 04/25/2016 1020   MCHC 35.0 04/25/2016 1020  RDW 12.6 04/25/2016 1020   RDW 13.5 02/02/2015 0805   LYMPHSABS 1.7 04/25/2016 1020   LYMPHSABS 1.7 02/02/2015 0805   MONOABS 0.3 04/25/2016 1020   MONOABS 0.4 02/02/2015 0805   EOSABS 0.0 04/25/2016 1020   EOSABS 0.1 02/02/2015 0805   BASOSABS 0.0 04/25/2016 1020   BASOSABS 0.0 02/02/2015 0805    No results found for: POCLITH, LITHIUM   No results found for: PHENYTOIN, PHENOBARB, VALPROATE, CBMZ   .res Assessment: Plan:    Major depressive disorder, recurrent episode, moderate (HCC)  Generalized anxiety disorder  Panic disorder with  agoraphobia  Insomnia due to mental condition  Done and symptoms have been largely controlled through the combination of increasing the trazodone, starting the NAC, therapy with [name] and [facility].  She is satisfied with her current treatment.  She appears to be fairly medication sensitive but is tolerating these meds well.  The NAC is being used off label for ADD and cognitive reasons.  She does not want to take stimulants.  Follow-up 6 months  Lynder Parents, MD, DFAPA  Please see After Visit Summary for patient specific instructions.  Future Appointments  Date Time Provider [place]  09/29/2018 11:00 AM Causey, Charlestine Massed, NP [facility] None    No orders of the defined types were placed in this encounter.     -------------------------------

## 2018-08-04 DIAGNOSIS — F332 Major depressive disorder, recurrent severe without psychotic features: Secondary | ICD-10-CM | POA: Diagnosis not present

## 2018-08-04 DIAGNOSIS — F438 Other reactions to severe stress: Secondary | ICD-10-CM | POA: Diagnosis not present

## 2018-08-15 DIAGNOSIS — M8589 Other specified disorders of bone density and structure, multiple sites: Secondary | ICD-10-CM | POA: Diagnosis not present

## 2018-08-15 DIAGNOSIS — Z853 Personal history of malignant neoplasm of breast: Secondary | ICD-10-CM | POA: Diagnosis not present

## 2018-08-15 DIAGNOSIS — Z8262 Family history of osteoporosis: Secondary | ICD-10-CM | POA: Diagnosis not present

## 2018-08-15 DIAGNOSIS — Z90710 Acquired absence of both cervix and uterus: Secondary | ICD-10-CM | POA: Diagnosis not present

## 2018-08-15 LAB — HM DEXA SCAN

## 2018-08-18 DIAGNOSIS — F438 Other reactions to severe stress: Secondary | ICD-10-CM | POA: Diagnosis not present

## 2018-08-18 DIAGNOSIS — F332 Major depressive disorder, recurrent severe without psychotic features: Secondary | ICD-10-CM | POA: Diagnosis not present

## 2018-08-20 ENCOUNTER — Encounter: Payer: Self-pay | Admitting: *Deleted

## 2018-09-01 ENCOUNTER — Telehealth: Payer: Self-pay | Admitting: *Deleted

## 2018-09-01 NOTE — Telephone Encounter (Signed)
"  Kristen Gamble [phone]).  Leaking breast implants removed June 2019.  Pain before removal.  Sporadic pain to left rib cage and right lower back was sporadic has become continuous.  Calling to be seen because pain is worse.  ES Tylenol maybe every other day."            Annual Long Term Survivorship visit at 11:00 am on 09-29-2018.

## 2018-09-02 NOTE — Telephone Encounter (Signed)
This RN returned call to pt per TRIAGE note- Kristen Gamble states pain is primarily in her left axillary and on the left chest.  Pain started post " when they removed my implants and had to scrape my chest and I thought the pain would get better but it is only getting more intense "  She does not feel any new lumps.  She would like to see Dr [name] " because I hope the cancer hasn't come back "  Appointment made per above.

## 2018-09-02 NOTE — Telephone Encounter (Signed)
"  Kristen Gamble [phone]).  Triage nurse to call me about scheduling appointment based on yesterday's message."

## 2018-09-04 NOTE — Progress Notes (Signed)
Kristen Gamble  [phone] [phone]     ID: Kristen Gamble   DOB: 05-13-1961  MR#: [id number]  JYN#:[id number]  Patient Care Team: Rita Ohara, MD as PCP - General (Family Medicine) Iyana Topor, Virgie Dad, MD (Hematology and Oncology) [name], Belinda Block, MD as Consulting Physician (Gynecology) Dian Queen, MD as Consulting Physician (Obstetrics and Gynecology) Leta Baptist, MD as Consulting Physician (Otolaryngology) Sueanne Margarita, MD as Consulting Physician (Cardiology) Ronald Lobo, MD as Consulting Physician (Gastroenterology) OTHER MD:    CHIEF COMPLAINT: Estrogen receptor positive breast cancer  CURRENT TREATMENT: Observation    HISTORY OF PRESENT ILLNESS: From the original intake note:   I do not have records before 10/2006, but in that month the patient underwent bilateral mastectomy for left sided breast cancer, which proved to be an invasive ductal carcinoma, grade 1, measuring 6 mm, in the setting of DCIS (grade 2).  One of the margins was positive focally.  The tumor was ER and PR more than 95% positive, Hercept test negative (at 0/1), and she had an Oncotype recurrence score of 13 predicting a distant risk of recurrence of 8% if she took tamoxifen for 5 years.  In 11/2006 the patient underwent a second excision to clear the margins.  There was a 5 mm residual focus of tumor.  Left axillary lymph node sampling showed a negative sentinel lymph node.  Margins were negative, although close (margins for DCIS were less than 1 mm, and for the invasive tumor less than 2 mm, both deep).  She had lateral subpectoral implants placed, and has been on Femara with fairly significant side effects that are a major source of concern to the patient.  In addition, the patient underwent TAH/BSO 03/2007 with benign pathology.  She has also had multiple other breast biopsies (12/2006, two right sided benign breast lesions; 08/2007, removal of a dermatofibroma;  12/2007, removal of a 2 mm left breast mass which was benign, and 04/2008 a left breast biopsy which proved to be scar).  The patient has had extensive studies including CT of chest, abdomen, and pelvis 12/2007, which was negative.  She had a bone scan in 05/2007 which was negative.   Her subsequent history is as detailed below.   INTERVAL HISTORY: Kristen Gamble returns today for follow-up and treatment of her estrogen receptor positive breast cancer.   She continues under observation.    Since her last visit here, she had her bilateral implants removed due to rupture on 12/16/2017. She has decided against a new reconstruction.   She also underwent a bone density screening on 08/15/2018, showing a T-score of -2.0, which is considered osteopenic.     REVIEW OF SYSTEMS: Since her implant removal, Kristen Gamble has felt an uncomfortable pain around her ribcage, but in the last month or two she has had sharp jolts of pain around her rib cage and in her axilla. She has had some stress surrounding her son and his medical issues. She has headaches all the time, but she attributes it to working at her computer a lot; she has a blue light filter for her glasses. 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 She is having hot flashes, mostly at night; she also has some stress induced hot flashes. The patient denies unusual headaches, visual changes, nausea, vomiting, or dizziness. There has been no unusual cough, phlegm production, or pleurisy. This been no change in bowel or bladder habits. The patient denies unexplained fatigue or unexplained weight loss, bleeding, rash,  or fever. A detailed review of systems was otherwise noncontributory.    PAST MEDICAL HISTORY: Past Medical History:  Diagnosis Date  . ADHD (attention deficit hyperactivity disorder) 08/2011  . Arthritis   . Breast cancer ([place]) 10/2006   left  . Complication of anesthesia    nausea  . Constipation   . Family history of breast cancer   .  Family history of melanoma   . Family history of rectal cancer   . Hematuria   . History of kidney stones   . Nephrolithiasis    and stone distal R ureter on CT and R hydronephrosis  . Numbness    under l arm  . Osteopenia 05/2013   T score minus 1.7  . PONV (postoperative nausea and vomiting)   . TMJ (temporomandibular joint disorder)     PAST SURGICAL HISTORY: Past Surgical History:  Procedure Laterality Date  . ABDOMINAL HYSTERECTOMY    . AUGMENTATION MAMMAPLASTY  1999  . BREAST BIOPSY     x4 in 2009-2012, all benign  . BREAST SURGERY  2008   Lumpectomy-Bilateral mastectomy-implants  . COLPOSCOPY    . CYSTOSCOPY    . CYSTOSCOPY WITH RETROGRADE PYELOGRAM, URETEROSCOPY AND STENT PLACEMENT Left 11/05/2014   Procedure: CYSTOSCOPY WITH RETROGRADE PYELOGRAM, URETEROSCOPY AND STENT PLACEMENT;  Surgeon: Franchot Gallo, MD;  Location: [facility];  Service: Urology;  Laterality: Left;  . CYSTOSCOPY/RETROGRADE/URETEROSCOPY/STONE EXTRACTION WITH BASKET Right 11/13/2013   Procedure: CYSTOSCOPY, RIGHT RETROGRADE PYLOGRAM WITH INTERPRETATION, RIGHT URETEROSCOPY WITH DILATION OF URETERAL STRICTURE WITH BASKET EXTRACTION OF STONE;  Surgeon: Ailene Rud, MD;  Location: [facility];  Service: Urology;  Laterality: Right;  . FOOT SURGERY  2014   Right big toe  . GYNECOLOGIC CRYOSURGERY  age 71  . LAPAROSCOPIC TOTAL HYSTERECTOMY    . MASTECTOMY Bilateral 01-2007  . PARATHYROIDECTOMY N/A 07/07/2015   Procedure: PARATHYROIDECTOMY;  Surgeon: Armandina Gemma, MD;  Location: [facility];  Service: General;  Laterality: N/A;  . PELVIC LAPAROSCOPY     LAP HYST BSO  . STONE EXTRACTION WITH BASKET Left 11/05/2014   Procedure: STONE EXTRACTION WITH BASKET;  Surgeon: Franchot Gallo, MD;  Location[facility];  Service: Urology;  Laterality: Left;  . TONSILLECTOMY  1971  . TOTAL ABDOMINAL HYSTERECTOMY W/ BILATERAL SALPINGOOPHORECTOMY  9/08    FAMILY HISTORY: Family History    Problem Relation Age of Onset  . Diabetes Father   . Alcohol abuse Father   . Depression Father   . Ulcers Father   . Stomach cancer Father 73       treated with partial gastrectomy. Died of other causes at 108.  . Diabetes Maternal Grandmother   . Hypertension Maternal Grandmother   . Heart attack Maternal Grandmother   . Heart disease Maternal Grandmother   . Hypothyroidism Maternal Grandmother   . Melanoma Maternal Grandmother 60       d.90  . Hypertension Maternal Grandfather   . Stroke Maternal Grandfather   . Asthma Maternal Grandfather   . Colon polyps Maternal Grandfather   . Heart attack Paternal Grandfather   . Heart disease Paternal Grandfather 75       died of MI  . Arthritis Mother   . Hypothyroidism Mother   . Diabetes Brother   . Breast cancer Paternal Aunt 59       d.70s  . Rectal cancer Cousin 40       d.51s Daughter of unaffected maternal uncle.   The patient's father died  at the age of 62 in the setting of alcoholism.  The patient's mother is alive. There is a brother in good health.  There is no other history of breast cancer or ovarian cancer in the family.   GYNECOLOGIC HISTORY: She is GX, P2.  First pregnancy to term age 14.  She has had significant problems with hot flashes and vaginal dryness since she had her TAH/BSO in September of 2008.   SOCIAL HISTORY: (updated 09/05/2018) Lenah is very active in our [name]. 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 Their daughter [name], will be going to [employer]. Their second child, [name], died at the age of 2.5 from a viral myocarditis.  [name] has a son, [name], from a prior marriage. Laryah and [name] have also adopted a pair of twins. The patient attends [employer] DIRECTIVES: in place   HEALTH MAINTENANCE: Social History   Tobacco Use  . Smoking status: Never Smoker  . Smokeless tobacco: Never Used   Substance Use Topics  . Alcohol use: Yes    Alcohol/week: 5.0 standard drinks    Types: 5 Standard drinks or equivalent per week    Comment: 1 glass of wine 4-5 times week.  . Drug use: No     Colonoscopy: 2015, f/u recommended in 3 years  PAP: s/p TAH/BSO  Bone density: 06/05/2013, osteopenia  Lipid panel: 2013  Allergies  Allergen Reactions  . Prednisone Palpitations  . Morphine Itching    REACTION: itching REACTION: itching    Current Outpatient Medications  Medication Sig Dispense Refill  . Acetylcysteine 600 MG CAPS Take 600 mg by mouth daily.    . cetirizine (ZYRTEC) 10 MG chewable tablet Chew 10 mg by mouth daily.    . cholecalciferol (VITAMIN D3) 25 MCG (1000 UT) tablet Take 1,000 Units by mouth daily.    . fluticasone (FLONASE) 50 MCG/ACT nasal spray Place 2 sprays into both nostrils daily. 16 g 11  . gabapentin (NEURONTIN) 300 MG capsule Take 1 capsule (300 mg total) by mouth at bedtime. 90 capsule 4  . guaiFENesin (MUCINEX) 600 MG 12 hr tablet Take 600 mg by mouth 2 (two) times daily.    Marland Kitchen L-Methylfolate-Algae (DEPLIN 15 PO) Take by mouth.    . Multiple Vitamins-Minerals (MULTIVITAMIN WITH MINERALS) tablet Take 1 tablet daily by mouth.    . phenylephrine (SUDAFED PE) 10 MG TABS tablet Take 10 mg by mouth every 4 (four) hours as needed.    . polyethylene glycol (MIRALAX / GLYCOLAX) packet Take 17 g by mouth daily as needed.    . Thyroid 48.75 MG TABS Take 1 tablet by mouth daily.    . traZODone (DESYREL) 100 MG tablet Take 2 tablets (200 mg total) by mouth at bedtime. 180 tablet 0  . vitamin B-12 (CYANOCOBALAMIN) 100 MCG tablet Take 100 mcg by mouth daily.     No current facility-administered medications for this visit.     OBJECTIVE: Middle-aged white woman in no acute distress Vitals:   09/05/18 0942  BP: 93/74  Pulse: 66  Resp: 18  Temp: 97.6 F (36.4 C)  SpO2: 100%     Body mass index is 21.83 kg/m.    ECOG FS: 1  Sclerae unicteric, EOMs  intact Oropharynx clear and moist No cervical or supraclavicular adenopathy Lungs no rales or rhonchi Heart regular rate and rhythm Abd soft, nontender, positive bowel sounds MSK no focal spinal tenderness,  no upper extremity lymphedema Neuro: nonfocal, well oriented, appropriate affect Breasts: Status post bilateral mastectomies.  There is no evidence of chest wall recurrence.  The painful area in the left axilla shows some focal tenderness to vigorous palpation but the soft tissues are entirely normal.  LAB RESULTS: Lab Results  Component Value Date   WBC 4.7 04/25/2016   NEUTROABS 2.5 04/25/2016   HGB 13.7 04/25/2016   HCT 39.1 04/25/2016   MCV 94.4 04/25/2016   PLT 243 04/25/2016      Chemistry      Component Value Date/Time   NA 141 04/25/2016 1020   NA 141 02/02/2015 0805   K 3.6 04/25/2016 1020   K 4.1 02/02/2015 0805   CL 108 04/25/2016 1020   CO2 26 04/25/2016 1020   CO2 26 02/02/2015 0805   BUN 8 04/25/2016 1020   BUN 20.3 02/02/2015 0805   CREATININE 0.51 04/25/2016 1020   CREATININE 0.7 02/02/2015 0805      Component Value Date/Time   CALCIUM 9.2 04/25/2016 1020   CALCIUM 10.6 (H) 02/02/2015 0805   ALKPHOS 56 04/25/2016 1020   ALKPHOS 83 02/02/2015 0805   AST 22 04/25/2016 1020   AST 26 02/02/2015 0805   ALT 15 04/25/2016 1020   ALT 28 02/02/2015 0805   BILITOT 0.5 04/25/2016 1020   BILITOT 0.38 02/02/2015 0805       Lab Results  Component Value Date   LABCA2 27 02/04/2012    No components found for: [id number]  No results for input(s): INR in the last 168 hours.  Urinalysis    Component Value Date/Time   COLORURINE YELLOW 10/28/2013 1010   APPEARANCEUR CLOUDY (A) 10/28/2013 1010   LABSPEC 1.020 04/10/2018 1427   PHURINE 7.5 10/28/2013 1010   GLUCOSEU NEG 10/28/2013 1010   HGBUR LARGE (A) 10/28/2013 1010   BILIRUBINUR negative 04/10/2018 1427   BILIRUBINUR neg 01/03/2015 1336   KETONESUR negative 04/10/2018 1427   KETONESUR NEG  10/28/2013 1010   PROTEINUR negative 04/10/2018 1427   PROTEINUR neg 01/03/2015 1336   PROTEINUR NEG 10/28/2013 1010   UROBILINOGEN negative 01/03/2015 1336   UROBILINOGEN 0.2 10/28/2013 1010   NITRITE Negative 04/10/2018 1427   NITRITE neg 01/03/2015 1336   NITRITE NEG 10/28/2013 1010   LEUKOCYTESUR Trace (A) 04/10/2018 1427    STUDIES: Bone density results reviewed   ASSESSMENT: 58 y.o. Chunky woman status post bilateral mastectomies with subpectoral implant placement July 2008 for a T1c N0, grade 1 invasive ductal carcinoma, strongly estrogen and progesterone receptor positive, HER2/neu negative, Oncotype DX recurrence score of 13, predicting an 8% risk of recurrence after 5 years of tamoxifen.    (1) She started antiestrogens September 2008, initially tamoxifen, then Femara with Lupron, switched to tamoxifen again November 2009, stopped tamoxifen at her own discretion December 2011, resumed July 2012,  discontinued July 2015 , when the patient went off Vagifem suppositories  (2) osteopenia:  (a) scan 06/05/2013 showed a T score of -1.7  (b) DEXA scan 08/15/2018 shows a T score of -2.0    PLAN: Kristen Gamble is now 11-1/2 years out from definitive surgery for breast cancer with no evidence of disease recurrence.  This is very favorable.  I reassured her that shooting pains in an area of prior surgery is really not uncommon.  These pains can come and go as scars change with time.  She does have a little bit of focal tenderness over the rib cage laterally on the left and I am  setting her up for plain films of that area today.  I think she would benefit from gabapentin at bedtime for the shooting pains and swell as for issues with hot flashes and I put the prescription in for her.  She has been on this previously with no side effects.  I wrote her a prescription for bras and prostheses.  We discussed her bone density and I suggested she discuss denosumab/Prolia with her primary care  physician Dr. Tomi Bamberger.  Otherwise she will return to see me in 1 year.  She knows to call for any other issue that may develop before the next visit.  Kristen Gamble, Virgie Dad, MD  09/05/18 10:10 AM Medical Oncology and Hematology [facility] [address] [place], [place] [address] Tel. [phone]    Fax. [phone]  I, Jacqualyn Posey am acting as a Education administrator for Chauncey Cruel, MD.   I, Lurline Del MD, have reviewed the above documentation for accuracy and completeness, and I agree with the above.

## 2018-09-05 ENCOUNTER — Telehealth: Payer: Self-pay | Admitting: Oncology

## 2018-09-05 ENCOUNTER — Inpatient Hospital Stay: Payer: BLUE CROSS/BLUE SHIELD | Attending: Oncology | Admitting: Oncology

## 2018-09-05 ENCOUNTER — Ambulatory Visit (HOSPITAL_COMMUNITY)
Admission: RE | Admit: 2018-09-05 | Discharge: 2018-09-05 | Disposition: A | Discharge status: Home / Self Care | Payer: BLUE CROSS/BLUE SHIELD | Source: Ambulatory Visit | Attending: Oncology | Admitting: Oncology

## 2018-09-05 VITALS — BP 93/74 | HR 66 | Temp 97.6°F | Resp 18 | Ht 63.5 in | Wt 125.2 lb

## 2018-09-05 DIAGNOSIS — C50919 Malignant neoplasm of unspecified site of unspecified female breast: Secondary | ICD-10-CM | POA: Diagnosis not present

## 2018-09-05 DIAGNOSIS — Z79811 Long term (current) use of aromatase inhibitors: Secondary | ICD-10-CM | POA: Diagnosis not present

## 2018-09-05 DIAGNOSIS — M1990 Unspecified osteoarthritis, unspecified site: Secondary | ICD-10-CM

## 2018-09-05 DIAGNOSIS — Z9013 Acquired absence of bilateral breasts and nipples: Secondary | ICD-10-CM | POA: Diagnosis not present

## 2018-09-05 DIAGNOSIS — Z170 Estrogen receptor positive status [ER+]: Secondary | ICD-10-CM | POA: Diagnosis not present

## 2018-09-05 DIAGNOSIS — Z79899 Other long term (current) drug therapy: Secondary | ICD-10-CM | POA: Diagnosis not present

## 2018-09-05 DIAGNOSIS — M8580 Other specified disorders of bone density and structure, unspecified site: Secondary | ICD-10-CM | POA: Diagnosis not present

## 2018-09-05 DIAGNOSIS — Z808 Family history of malignant neoplasm of other organs or systems: Secondary | ICD-10-CM | POA: Diagnosis not present

## 2018-09-05 DIAGNOSIS — R232 Flushing: Secondary | ICD-10-CM | POA: Diagnosis not present

## 2018-09-05 DIAGNOSIS — Z803 Family history of malignant neoplasm of breast: Secondary | ICD-10-CM | POA: Diagnosis not present

## 2018-09-05 DIAGNOSIS — R51 Headache: Secondary | ICD-10-CM | POA: Diagnosis not present

## 2018-09-05 DIAGNOSIS — Z90710 Acquired absence of both cervix and uterus: Secondary | ICD-10-CM

## 2018-09-05 DIAGNOSIS — C50012 Malignant neoplasm of nipple and areola, left female breast: Secondary | ICD-10-CM

## 2018-09-05 DIAGNOSIS — Z90722 Acquired absence of ovaries, bilateral: Secondary | ICD-10-CM | POA: Insufficient documentation

## 2018-09-05 MED ORDER — GABAPENTIN 300 MG PO CAPS: 300.0000 mg | ORAL_CAPSULE | Freq: Every day | ORAL | 4 refills | Status: DC

## 2018-09-05 NOTE — Telephone Encounter (Signed)
Patient decline avs and calendar °

## 2018-09-07 ENCOUNTER — Encounter: Payer: Self-pay | Admitting: Oncology

## 2018-09-12 DIAGNOSIS — F438 Other reactions to severe stress: Secondary | ICD-10-CM | POA: Diagnosis not present

## 2018-09-12 DIAGNOSIS — F332 Major depressive disorder, recurrent severe without psychotic features: Secondary | ICD-10-CM | POA: Diagnosis not present

## 2018-09-25 ENCOUNTER — Other Ambulatory Visit: Payer: Self-pay | Admitting: Psychiatry

## 2018-09-26 ENCOUNTER — Ambulatory Visit (INDEPENDENT_AMBULATORY_CARE_PROVIDER_SITE_OTHER): Payer: Self-pay | Admitting: Family Medicine

## 2018-09-26 ENCOUNTER — Encounter: Payer: Self-pay | Admitting: Family Medicine

## 2018-09-26 ENCOUNTER — Other Ambulatory Visit: Payer: Self-pay

## 2018-09-26 VITALS — BP 110/60 | HR 76 | Temp 97.8°F | Resp 16 | Wt 124.6 lb

## 2018-09-26 DIAGNOSIS — R6889 Other general symptoms and signs: Secondary | ICD-10-CM

## 2018-09-26 LAB — POCT INFLUENZA A/B
Influenza A, POC: NEGATIVE
Influenza B, POC: NEGATIVE

## 2018-09-26 NOTE — Progress Notes (Signed)
   Subjective:    Patient ID: Kristen Gamble, female    DOB: 1961-04-23, 58 y.o.   MRN: [id number]  HPI Chief Complaint  Patient presents with  . cough    cough, tired, HA, allergies. sore throat, left ear pain, pressure in chest. started yesterday.    Here with complaints of a 24 hour history of fever (subjective), chills, headache, left ear discomfort, sore throat, nausea, chest congestion, dry cough.  No recent travel. Daughter roommate with flu 2 weeks ago.   Denies history of lung disease.  History of breast cancer. No current treatment needed.   Does not smoke. No recent antibiotics.   Denies dizziness, palpitations, abdominal pain, vomiting, diarrhea, urinary symptoms.   Reviewed allergies, medications, past medical, surgical, family, and social history.    Review of Systems Pertinent positives and negatives in the history of present illness.     Objective:   Physical Exam BP 110/60   Pulse 76   Temp 97.8 F (36.6 C) (Oral)   Resp 16   Wt 124 lb 9.6 oz (56.5 kg)   SpO2 98%   BMI 21.73 kg/m   Alert and oriented and in no acute distress. No sinus tenderness. Nares with erythema, edema and no discharge.  Tympanic membranes and canals are normal. Pharyngeal area is mildly erythematous, no edema or exudate. Neck is supple without adenopathy or thyromegaly. Cardiac exam shows a regular rhythm without murmurs or gallops. Lungs are clear to auscultation, normal work of breathing. Skin is warm and dry.       Assessment & Plan:  Flu-like symptoms - Plan: Influenza A/B  Negative flu swab.  Discussed that she appears to have a viral illness.  No obvious source of infection.  Vitals are WNL. encouraged her to stay home, rest, hydrate and treat her symptoms.  She will call Monday and let me know if she is not improving.

## 2018-09-26 NOTE — Patient Instructions (Signed)
  Your flu swab is negative.   I suspect your symptoms are related to a viral illness and recommend treating your symptoms at this point.  Mucinex DM for congestion and cough, drink extra water, use salt water gargles for throat irritation and Tylenol or Ibuprofen for aches and pains.  Call if you are not improving by days 7-10 of your illness or if you develop fever, wheezing or worsening symptoms.

## 2018-09-29 ENCOUNTER — Encounter: Payer: BLUE CROSS/BLUE SHIELD | Admitting: Adult Health

## 2018-10-04 ENCOUNTER — Other Ambulatory Visit: Payer: Self-pay | Admitting: Family Medicine

## 2018-10-04 DIAGNOSIS — J309 Allergic rhinitis, unspecified: Secondary | ICD-10-CM

## 2018-10-17 DIAGNOSIS — F332 Major depressive disorder, recurrent severe without psychotic features: Secondary | ICD-10-CM | POA: Diagnosis not present

## 2018-10-17 DIAGNOSIS — F438 Other reactions to severe stress: Secondary | ICD-10-CM | POA: Diagnosis not present

## 2018-10-27 DIAGNOSIS — Z9011 Acquired absence of right breast and nipple: Secondary | ICD-10-CM | POA: Diagnosis not present

## 2018-10-27 DIAGNOSIS — C50912 Malignant neoplasm of unspecified site of left female breast: Secondary | ICD-10-CM | POA: Diagnosis not present

## 2018-11-07 DIAGNOSIS — F438 Other reactions to severe stress: Secondary | ICD-10-CM | POA: Diagnosis not present

## 2018-11-19 DIAGNOSIS — C50912 Malignant neoplasm of unspecified site of left female breast: Secondary | ICD-10-CM | POA: Diagnosis not present

## 2018-12-16 DIAGNOSIS — F438 Other reactions to severe stress: Secondary | ICD-10-CM | POA: Diagnosis not present

## 2018-12-16 DIAGNOSIS — F332 Major depressive disorder, recurrent severe without psychotic features: Secondary | ICD-10-CM | POA: Diagnosis not present

## 2019-01-01 DIAGNOSIS — E039 Hypothyroidism, unspecified: Secondary | ICD-10-CM | POA: Diagnosis not present

## 2019-01-01 DIAGNOSIS — E559 Vitamin D deficiency, unspecified: Secondary | ICD-10-CM | POA: Diagnosis not present

## 2019-01-01 DIAGNOSIS — R5383 Other fatigue: Secondary | ICD-10-CM | POA: Diagnosis not present

## 2019-01-13 ENCOUNTER — Encounter: Payer: BLUE CROSS/BLUE SHIELD | Admitting: Psychiatry

## 2019-01-13 ENCOUNTER — Other Ambulatory Visit: Payer: Self-pay

## 2019-01-13 NOTE — Progress Notes (Signed)
error    This encounter was created in error - please disregard.

## 2019-01-22 DIAGNOSIS — F418 Other specified anxiety disorders: Secondary | ICD-10-CM | POA: Diagnosis not present

## 2019-01-22 DIAGNOSIS — R5383 Other fatigue: Secondary | ICD-10-CM | POA: Diagnosis not present

## 2019-01-22 DIAGNOSIS — E2740 Unspecified adrenocortical insufficiency: Secondary | ICD-10-CM | POA: Diagnosis not present

## 2019-01-22 DIAGNOSIS — E039 Hypothyroidism, unspecified: Secondary | ICD-10-CM | POA: Diagnosis not present

## 2019-01-27 DIAGNOSIS — D1801 Hemangioma of skin and subcutaneous tissue: Secondary | ICD-10-CM | POA: Diagnosis not present

## 2019-01-27 DIAGNOSIS — L821 Other seborrheic keratosis: Secondary | ICD-10-CM | POA: Diagnosis not present

## 2019-01-27 DIAGNOSIS — D2372 Other benign neoplasm of skin of left lower limb, including hip: Secondary | ICD-10-CM | POA: Diagnosis not present

## 2019-01-28 ENCOUNTER — Ambulatory Visit: Payer: BLUE CROSS/BLUE SHIELD | Admitting: Psychiatry

## 2019-02-09 DIAGNOSIS — F4323 Adjustment disorder with mixed anxiety and depressed mood: Secondary | ICD-10-CM | POA: Diagnosis not present

## 2019-03-11 DIAGNOSIS — R5383 Other fatigue: Secondary | ICD-10-CM | POA: Diagnosis not present

## 2019-03-11 DIAGNOSIS — E039 Hypothyroidism, unspecified: Secondary | ICD-10-CM | POA: Diagnosis not present

## 2019-03-11 DIAGNOSIS — N200 Calculus of kidney: Secondary | ICD-10-CM | POA: Diagnosis not present

## 2019-03-11 DIAGNOSIS — M8580 Other specified disorders of bone density and structure, unspecified site: Secondary | ICD-10-CM | POA: Diagnosis not present

## 2019-03-16 ENCOUNTER — Other Ambulatory Visit: Payer: Self-pay

## 2019-03-16 DIAGNOSIS — R6889 Other general symptoms and signs: Secondary | ICD-10-CM | POA: Diagnosis not present

## 2019-03-16 DIAGNOSIS — Z20822 Contact with and (suspected) exposure to covid-19: Secondary | ICD-10-CM

## 2019-03-17 LAB — NOVEL CORONAVIRUS, NAA: SARS-CoV-2, NAA: NOT DETECTED

## 2019-03-19 ENCOUNTER — Other Ambulatory Visit: Payer: Self-pay | Admitting: Family Medicine

## 2019-03-19 DIAGNOSIS — J309 Allergic rhinitis, unspecified: Secondary | ICD-10-CM

## 2019-03-19 NOTE — Telephone Encounter (Signed)
Last physical was a long time ago (2013?), mainly comes in for acute visits.  Rec scheduling CPE, and refilling flonase until then (3-4 months is fine, no rush)

## 2019-03-19 NOTE — Telephone Encounter (Signed)
Is this okay to refill? 

## 2019-03-19 NOTE — Telephone Encounter (Signed)
Filled x 3 months and left message for patient to please call back and schedule appt. Will call again to schedule.

## 2019-03-23 ENCOUNTER — Other Ambulatory Visit: Payer: Self-pay | Admitting: Psychiatry

## 2019-04-03 DIAGNOSIS — F4323 Adjustment disorder with mixed anxiety and depressed mood: Secondary | ICD-10-CM | POA: Diagnosis not present

## 2019-04-25 DIAGNOSIS — Z23 Encounter for immunization: Secondary | ICD-10-CM | POA: Diagnosis not present

## 2019-05-11 ENCOUNTER — Other Ambulatory Visit: Payer: Self-pay | Admitting: *Deleted

## 2019-05-11 DIAGNOSIS — Z20828 Contact with and (suspected) exposure to other viral communicable diseases: Secondary | ICD-10-CM | POA: Diagnosis not present

## 2019-05-11 DIAGNOSIS — Z20822 Contact with and (suspected) exposure to covid-19: Secondary | ICD-10-CM

## 2019-05-13 DIAGNOSIS — E039 Hypothyroidism, unspecified: Secondary | ICD-10-CM | POA: Diagnosis not present

## 2019-05-13 LAB — NOVEL CORONAVIRUS, NAA: SARS-CoV-2, NAA: NOT DETECTED

## 2019-05-18 ENCOUNTER — Other Ambulatory Visit: Payer: Self-pay

## 2019-05-18 ENCOUNTER — Ambulatory Visit (INDEPENDENT_AMBULATORY_CARE_PROVIDER_SITE_OTHER): Payer: BC Managed Care – PPO | Admitting: Family Medicine

## 2019-05-18 ENCOUNTER — Encounter: Payer: Self-pay | Admitting: Family Medicine

## 2019-05-18 VITALS — BP 92/60 | HR 60 | Temp 96.8°F | Ht 63.5 in | Wt 122.8 lb

## 2019-05-18 DIAGNOSIS — R5383 Other fatigue: Secondary | ICD-10-CM

## 2019-05-18 DIAGNOSIS — E559 Vitamin D deficiency, unspecified: Secondary | ICD-10-CM | POA: Diagnosis not present

## 2019-05-18 DIAGNOSIS — R002 Palpitations: Secondary | ICD-10-CM | POA: Diagnosis not present

## 2019-05-18 DIAGNOSIS — F411 Generalized anxiety disorder: Secondary | ICD-10-CM

## 2019-05-18 DIAGNOSIS — E039 Hypothyroidism, unspecified: Secondary | ICD-10-CM

## 2019-05-18 NOTE — Progress Notes (Signed)
Chief Complaint  Patient presents with  . Consult    has been daily to every other day heart fluttering as well as pain pressure in her chest (mostly left sided). Had B/L breast inplants removed in 2019. Tried to see Dr.Traci Turner but had been over 4 years so would need new referral but wanted to come in and see you anyway.   . Other    she just scheduled for Mon 05/25/2019 8:45am fasting CPE.    A couple of months ago she started noticing some intermittent fluttering in her chest. She has hypothyroidism, had been going to [facility], but switched to Dr. Chalmers Cater. She had changed her medications.  She had been taking Naturthroid (from [facility])  90 mg, there was a recall ,and she was changed and reduced to 60 mg of [place] Thyroid.  Dose changed about 1-2 months ago.  She has noticed  MORE of the fluttering in the last 3 weeks. She had labs done last week for Dr. Chalmers Cater, hasn't gotten results yet.  She describes it as feeling a pocket of fast flutters.  No sustained rapid heartbeat.  No associated chest pain or shortness of breath when having the fluttering. 1 cup coffee, decaff tea, no significant chocolate No decongestants.  In the last month or so she has had some recurrent pain in her left chest.  She has a sore spot at the interior chest (which she has pointed out to oncologist in the past), but also has pressure in the chest, more diffusely on the left side.  Pressure is at rest.  She has previously seen Dr. Radford Pax, and tried to schedule appt, but since it was so long ago, she was told she needed a referral, which is why she presents today. She had low risk perfusion study in 01/2015. She had a normal echo in 2011  She had breast implants removed due to rupture in 12/2017.  Dr. [name] put her on gabapentin in 08/2018 for shooting pains in the chest wall, and also to help with hot flashes. She remains on gabapentin, tolerates without side effects.  She has been under a lot of stress.  She has  been caring for her mother, hasn't exercised as much in the last 6 weeks.  Her mother has atrial fibrillation, and got a pacemaker, "it has been horrible, all on me, brother not helping". She also had issues with anxiety and depression related to son's use of alcohol/drugs, sent to rehab in [place]. He is currently in college in [place] and doing well. She sees Dr. Clovis Pu, and is also getting counseling [name].  She is active in [place]  [facility], [place], [place] reviewed  Outpatient Encounter Medications as of 05/18/2019  Medication Sig  . fluticasone (FLONASE) 50 MCG/ACT nasal spray SHAKE LIQUID AND USE 2 SPRAYS IN EACH NOSTRIL DAILY  . gabapentin (NEURONTIN) 300 MG capsule Take 1 capsule (300 mg total) by mouth at bedtime.  Marland Kitchen L-Methylfolate-Algae (DEPLIN 15 PO) Take 1 capsule by mouth daily.   . Multiple Vitamins-Minerals (MULTIVITAMIN WITH MINERALS) tablet Take 1 tablet daily by mouth.  . polyethylene glycol (MIRALAX / GLYCOLAX) packet Take 17 g by mouth daily as needed.  . thyroid (NP THYROID) 60 MG tablet Take 60 mg by mouth daily before breakfast.  . traZODone (DESYREL) 100 MG tablet TAKE 2 TABLETS(200 MG) BY MOUTH AT BEDTIME  . vitamin B-12 (CYANOCOBALAMIN) 100 MCG tablet Take 100 mcg by mouth daily.  . cetirizine (ZYRTEC) 10 MG chewable tablet Chew 10  mg by mouth daily.  . cholecalciferol (VITAMIN D3) 25 MCG (1000 UT) tablet Take 1,000 Units by mouth daily.  Marland Kitchen guaiFENesin (MUCINEX) 600 MG 12 hr tablet Take 600 mg by mouth 2 (two) times daily.  . phenylephrine (SUDAFED PE) 10 MG TABS tablet Take 10 mg by mouth every 4 (four) hours as needed.  . [DISCONTINUED] Acetylcysteine 600 MG CAPS Take 600 mg by mouth daily.  . [DISCONTINUED] Thyroid 48.75 MG TABS Take 1 tablet by mouth daily.   No facility-administered encounter medications on file as of 05/18/2019.    Allergies  Allergen Reactions  . Prednisone Palpitations  . Morphine Itching    REACTION: itching REACTION: itching   ROS:  No fever, chills,  headaches, dizziness, URI symptoms, shortness of breath, GI complaints, pain, rashes.  She reports having dry skin, chronic constipation (unchanged), has lost some weight in the last 2 months (lots of stress).  Decreased energy, tired all the time.  Sleeps well with trazadone. See HPI  PHYSICAL EXAM:  BP 92/60   Pulse 60   Temp (!) 96.8 F (36 C) (Other (Comment))   Ht 5' 3.5" (1.613 m)   Wt 122 lb 12.8 oz (55.7 kg)   BMI 21.41 kg/m   Pleasant, well-appearing female, not as energetic/happy as she normally is, seems tired/stressed HEENT: conjunctiva and sclera are clear, EOMI. Wearing mask Neck: no lymphadenopathy, thyromegaly or carotid bruit Heart: bradycardic. No murmur, rub, gallop Lungs: clear bilaterally Back: no spinal or CVA tenderness Abdomen: soft, nontender, no mass Extremities: no edema, 2+ pulses Neuro: alert and oriented, normal gait. Psych: +stressed/anxious. Full range of affect.  Normal hygiene and grooming  EKG: sinus brady, 1st degree block.  ASSESSMENT/PLAN:  Palpitations - Ddx discussed. May need echo, monitor. Stress may be a factor - Plan: Comprehensive metabolic panel, CBC with Differential/Platelet, Magnesium, EKG 12-Lead  Fatigue, unspecified type - Plan: Comprehensive metabolic panel, CBC with Differential/Platelet, VITAMIN D 25 Hydroxy (Vit-D Deficiency, Fractures), Magnesium  Vitamin D deficiency - Plan: VITAMIN D 25 Hydroxy (Vit-D Deficiency, Fractures)  GAD (generalized anxiety disorder)  Hypothyroidism, unspecified type - need results from Dr. Chalmers Cater (discussed how thyroid can effect her symptoms)    Check your pulse when you feel the flutters (may not be your heart at all). Count your pulse (count x 15 seconds and multiply by 4). The heart rate will be helpful to know (under 100, over 150, etc can mean different things).  If after meals, and not related to your pulse, try treating for reflux  c-met, CBC, Vit D EKG Get results from Dr.  Chalmers Cater  Refer to Dr. Radford Pax after labs back.  Scheduled for CPE for next week.  Will come fasting then for lipids.  nonfasting today.

## 2019-05-18 NOTE — Patient Instructions (Signed)
Your heart sounded normal--no murmurs or extra beats.  Check your pulse when you feel the flutters (may not be your heart at all). Count your pulse (count x 15 seconds and multiply by 4). The heart rate will be helpful to know (under 100, over 150, etc can mean different things).  If after meals, and not related to your pulse, try treating for reflux.  We will try and get the thyroid results from Dr. Chalmers Cater, as this can clearly contribute.  If everything is normal, we will refer you back to Dr. Radford Pax.  I suspect they will check an echocardiogram and a heart monitor.

## 2019-05-19 ENCOUNTER — Encounter: Payer: Self-pay | Admitting: Family Medicine

## 2019-05-19 LAB — CBC WITH DIFFERENTIAL/PLATELET
Basophils Absolute: 0 10*3/uL (ref 0.0–0.2)
Basos: 0 %
EOS (ABSOLUTE): 0 10*3/uL (ref 0.0–0.4)
Eos: 0 %
Hematocrit: 39.1 % (ref 34.0–46.6)
Hemoglobin: 13.4 g/dL (ref 11.1–15.9)
Immature Grans (Abs): 0 10*3/uL (ref 0.0–0.1)
Immature Granulocytes: 0 %
Lymphocytes Absolute: 1.8 10*3/uL (ref 0.7–3.1)
Lymphs: 38 %
MCH: 31.7 pg (ref 26.6–33.0)
MCHC: 34.3 g/dL (ref 31.5–35.7)
MCV: 92 fL (ref 79–97)
Monocytes Absolute: 0.4 10*3/uL (ref 0.1–0.9)
Monocytes: 8 %
Neutrophils Absolute: 2.5 10*3/uL (ref 1.4–7.0)
Neutrophils: 54 %
Platelets: 211 10*3/uL (ref 150–450)
RBC: 4.23 x10E6/uL (ref 3.77–5.28)
RDW: 12.7 % (ref 11.7–15.4)
WBC: 4.6 10*3/uL (ref 3.4–10.8)

## 2019-05-19 LAB — COMPREHENSIVE METABOLIC PANEL
ALT: 27 IU/L (ref 0–32)
AST: 25 IU/L (ref 0–40)
Albumin/Globulin Ratio: 2 (ref 1.2–2.2)
Albumin: 4.5 g/dL (ref 3.8–4.9)
Alkaline Phosphatase: 67 IU/L (ref 39–117)
BUN/Creatinine Ratio: 31 — ABNORMAL HIGH (ref 9–23)
BUN: 18 mg/dL (ref 6–24)
Bilirubin Total: 0.2 mg/dL (ref 0.0–1.2)
CO2: 27 mmol/L (ref 20–29)
Calcium: 9.6 mg/dL (ref 8.7–10.2)
Chloride: 105 mmol/L (ref 96–106)
Creatinine, Ser: 0.58 mg/dL (ref 0.57–1.00)
GFR calc Af Amer: 118 mL/min/{1.73_m2} (ref >59)
GFR calc non Af Amer: 103 mL/min/{1.73_m2} (ref >59)
Globulin, Total: 2.2 g/dL (ref 1.5–4.5)
Glucose: 103 mg/dL — ABNORMAL HIGH (ref 65–99)
Potassium: 4.6 mmol/L (ref 3.5–5.2)
Sodium: 142 mmol/L (ref 134–144)
Total Protein: 6.7 g/dL (ref 6.0–8.5)

## 2019-05-19 LAB — VITAMIN D 25 HYDROXY (VIT D DEFICIENCY, FRACTURES): Vit D, 25-Hydroxy: 45 ng/mL (ref 30.0–100.0)

## 2019-05-19 LAB — MAGNESIUM: Magnesium: 2.1 mg/dL (ref 1.6–2.3)

## 2019-05-23 NOTE — Progress Notes (Signed)
Chief Complaint  Patient presents with  . Annual Exam    fasting annual exam, no pap. Has one scheduled for Jan with Kristen Gamble. Seeing eye doctor this month. No new concerns.     Kristen Gamble is a 58 y.o. female who presents for a complete physical.  She has the following concerns:  Patient was seen last week with complaint of fluttering in her chest. EKG showed first degree AV block.  She had normal CBC, c-met (wasn't fasting), Mg and vitamin D.  She had thyroid tests done by Kristen Gamble earlier, and was waiting on the results. Results were faxed over from Kristen Gamble visit during visit, after calling for results--see below. Episodes are very short lived just a few seconds, occurring 2-3 times a day.  Has only noticed it at rest, not when exercising. She continues to have some discomfort in the left chest, as well as tender spots on lateral chest wall (ongoing since at least 08/2018, when evaluated by Kristen Gamble; had CXR, bones normal, started on gabapentin).  Seasonal allergies:  Flaring since raking leaves a few days ago.  She started the Flonase again only 3 days ago, hasn't started antihistamines yet. Has eye drops at home she plans to start today.  Eyes are itchy and red today.  Some nasal congestion, no postnasal drainage or sinus pain.  Hypothyroidism:  Under the care of Kristen Gamble.  Previously had been going to [name]. Denies changes to her hair/skin/bowels weight..  She does have fatigue, which may be multifactorial (medication side effects, allergies, etc.)  Medication was changed to armor thyroid 78m (dose was reduced).  Labs last week from Kristen Gamble TSH 0.06 ACTH was normal  She is under the care of Kristen Gamble anxiety and ADD.  She is getting counseling with Kristen Gamble and is active in [facility]  Using Trazadone for sleep, works well.  This was also to help with depression, related to when her son when into rehab.  History of L breast cancer, under the care of Kristen Gamble   She last saw him in 08/2018.  She had grade 1 invasive ductal carcinoma, strongly estrogen and progesterone receptor positive, HER2/neu negative.  She is s/p bilateral mastectomy, with subpectoral implant placement July 2008.  Implants were removed due to rupture on 12/16/2017, and isn't interested in any further reconstruction at this time. Dr. MJana Hakimstarted her on gabapentin 3035mat bedtime for complaints of shooting pains in L chest, and also hot flashes.  The shooting pains are improved, but still having some different pain at the left chest, which she relates to the stress related to her mother.  She continues to have sore spots (that were evaluated with x-ray).  Osteopenia:  She had DEXA done 07/2018.  It showed osteopenia, with T-2.0 at left femoral neck. FRAX score was 24% for major fracture (1.8% for hip). Per his note, she was to discuss possible Prolia treatment with PCP.  She had previously received IV bisphosphonate therapy (while on cancer treatment in 2008).  She had 3 doses, 6 months apart.  Went to ER after the first dose, had a lower dose the second and third doses. Switched oncologists to [facility] who took her off the bisphosphonate, and put her on Tamoxifen.  She has known TMJ, with jaw pain and bone loss per pt.  It is better than in the past. No known surgeries planned.  Immunization History  Administered Date(s) Administered  . Hepatitis A,  Adult 11/12/2014  . Influenza,inj,Quad PF,6+ Mos 04/10/2018, 04/25/2019  . Influenza-Unspecified 06/04/2017  . Tdap 10/22/2011  . Typhoid Live 11/12/2014   Last pap smear: 2013, normal, by Kristen Gamble.  She reports she has had one since, with Kristen Gamble.  She is TAH/BSO 03/2007 with benign pathology. Scheduled to see Kristen Gamble in January Last mammogram: n/a since mastectomy, has had u/s with lump evals Last colonoscopy: 04/24/2016, Kristen Gamble. Diverticulosis noted Last DEXA: 07/2018, T-2.0 L fem neck Dentist: twice  yearly Ophtho: once yearly Exercise: Has Peloton and treadmill, exercising 4-5 days/week (took a 6 week break related to mother's health, but recently restarted), plus some walking.  She works with trainer 2x/week (just starting back up again).  Past Medical History:  Diagnosis Date  . ADHD (attention deficit hyperactivity disorder) 08/2011  . Arthritis   . Breast cancer ([place]) 10/2006   left  . Complication of anesthesia    nausea  . Constipation   . Family history of breast cancer   . Family history of melanoma   . Family history of rectal cancer   . Generalized anxiety disorder   . Hematuria   . History of kidney stones   . Major depression    treated by Dr. Clovis Pu  . Nephrolithiasis    and stone distal R ureter on CT and R hydronephrosis  . Numbness    under l arm (resolved/intermittent)  . Osteopenia 05/2013   T score minus 1.7  . PONV (postoperative nausea and vomiting)   . TMJ (temporomandibular joint disorder)     Past Surgical History:  Procedure Laterality Date  . ABDOMINAL HYSTERECTOMY    . AUGMENTATION MAMMAPLASTY  1999  . BREAST BIOPSY     x4 in 2009-2012, all benign  . BREAST SURGERY  2008   Lumpectomy-Bilateral mastectomy-implants  . COLPOSCOPY    . CYSTOSCOPY    . CYSTOSCOPY WITH RETROGRADE PYELOGRAM, URETEROSCOPY AND STENT PLACEMENT Left 11/05/2014   Procedure: CYSTOSCOPY WITH RETROGRADE PYELOGRAM, URETEROSCOPY AND STENT PLACEMENT;  Surgeon: Franchot Gallo, MD;  Location: [facility];  Service: Urology;  Laterality: Left;  . CYSTOSCOPY/RETROGRADE/URETEROSCOPY/STONE EXTRACTION WITH BASKET Right 11/13/2013   Procedure: CYSTOSCOPY, RIGHT RETROGRADE PYLOGRAM WITH INTERPRETATION, RIGHT URETEROSCOPY WITH DILATION OF URETERAL STRICTURE WITH BASKET EXTRACTION OF STONE;  Surgeon: Kristen Rud, MD;  Location: [facility];  Service: Urology;  Laterality: Right;  . FOOT SURGERY  2014   Right big toe  . GYNECOLOGIC CRYOSURGERY  age 78  . LAPAROSCOPIC  TOTAL HYSTERECTOMY    . MASTECTOMY Bilateral 01-2007  . PARATHYROIDECTOMY N/A 07/07/2015   Procedure: PARATHYROIDECTOMY;  Surgeon: Armandina Gemma, MD;  Location: [facility];  Service: General;  Laterality: N/A;  . PELVIC LAPAROSCOPY     LAP HYST BSO  . STONE EXTRACTION WITH BASKET Left 11/05/2014   Procedure: STONE EXTRACTION WITH BASKET;  Surgeon: Franchot Gallo, MD;  Location[facility];  Service: Urology;  Laterality: Left;  . TONSILLECTOMY  1971  . TOTAL ABDOMINAL HYSTERECTOMY W/ BILATERAL SALPINGOOPHORECTOMY  9/08    Social History   Socioeconomic History  . Marital status: Married    Spouse name: Not on file  . Number of children: 4  . Years of education: Not on file  . Highest education level: Not on file  Occupational History  . 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 Financial resource strain: Not on file  . Food insecurity    Worry: Not on file    Inability:  Not on file  . Transportation needs    Medical: Not on file    Non-medical: Not on file  Tobacco Use  . Smoking status: Never Smoker  . Smokeless tobacco: Never Used  Substance and Sexual Activity  . Alcohol use: Yes    Alcohol/week: 1.0 standard drinks    Types: 1 Glasses of wine per week    Comment: 1 glass/week (cut back related to son't issues)  . Drug use: No  . Sexual activity: Yes    Partners: Male    Birth control/protection: Surgical    Comment: HYST  Lifestyle  . Physical activity    Days per week: Not on file    Minutes per session: Not on file  . Stress: Not on file  Relationships  . Social Herbalist on phone: Not on file    Gets together: Not on file    Attends religious service: Not on file    Active member of club or organization: Not on file    Attends meetings of clubs or organizations: Not on file    Relationship status: Not on file  . Intimate partner violence    Fear of current or ex partner: Not on file    Emotionally abused: Not on  file    Physically abused: Not on file    Forced sexual activity: Not on file  Other Topics Concern  . Not on file  Social History Narrative   Lives with her husband, 2 dogs, 2 cats.  She has boy/girl twins, and a daughter who graduated from [employer] and is in medical school at [employer]. Joslyn Hy is married, has 2 granddaughters.   Son (38 yo) alcoholic; was sent to rehab, and in sober living environment in [place] 03/2018.    In college in [place], doing well.   Helps care for her mother (who now lives nearby)    Family History  Problem Relation Age of Onset  . Diabetes Father   . Alcohol abuse Father   . Depression Father   . Ulcers Father   . Stomach cancer Father 16       treated with partial gastrectomy. Died of other causes at 6.  . Diabetes Maternal Grandmother   . Hypertension Maternal Grandmother   . Heart attack Maternal Grandmother   . Heart disease Maternal Grandmother   . Hypothyroidism Maternal Grandmother   . Melanoma Maternal Grandmother 60       d.90  . Hypertension Maternal Grandfather   . Stroke Maternal Grandfather   . Asthma Maternal Grandfather   . Colon polyps Maternal Grandfather   . Heart attack Paternal Grandfather   . Heart disease Paternal Grandfather 82       died of MI  . Arthritis Mother   . Hypothyroidism Mother   . Heart disease Mother 97       pacemaker, afib  . COPD Mother   . Diabetes Brother   . Breast cancer Paternal Aunt 60       d.70s  . Rectal cancer Cousin 40       d.55s Daughter of unaffected maternal uncle.    Outpatient Encounter Medications as of 05/25/2019  Medication Sig Note  . fluticasone (FLONASE) 50 MCG/ACT nasal spray SHAKE LIQUID AND USE 2 SPRAYS IN EACH NOSTRIL DAILY   . gabapentin (NEURONTIN) 300 MG capsule Take 1 capsule (300 mg total) by mouth at bedtime.   Marland Kitchen L-Methylfolate-Algae (DEPLIN 15 PO) Take 1 capsule by  mouth daily.    . Multiple Vitamins-Minerals (MULTIVITAMIN WITH MINERALS) tablet Take 1 tablet  daily by mouth.   . polyethylene glycol (MIRALAX / GLYCOLAX) packet Take 17 g by mouth daily as needed.   . thyroid (NP THYROID) 60 MG tablet Take 60 mg by mouth daily before breakfast.   . traZODone (DESYREL) 100 MG tablet TAKE 2 TABLETS(200 MG) BY MOUTH AT BEDTIME   . vitamin B-12 (CYANOCOBALAMIN) 100 MCG tablet Take 100 mcg by mouth daily.   . cetirizine (ZYRTEC) 10 MG chewable tablet Chew 10 mg by mouth daily.   Marland Kitchen guaiFENesin (MUCINEX) 600 MG 12 hr tablet Take 600 mg by mouth 2 (two) times daily. 05/25/2019: Uses prn  . phenylephrine (SUDAFED PE) 10 MG TABS tablet Take 10 mg by mouth every 4 (four) hours as needed.   . [DISCONTINUED] cholecalciferol (VITAMIN D3) 25 MCG (1000 UT) tablet Take 1,000 Units by mouth daily.    No facility-administered encounter medications on file as of 05/25/2019.     Allergies  Allergen Reactions  . Prednisone Palpitations  . Morphine Itching    REACTION: itching REACTION: itching    ROS:  The patient denies anorexia, fever, weight changes, headaches,  vision changes, decreased hearing, ear pain, sore throat, breast concerns, dizziness, syncope, dyspnea on exertion, cough, swelling, nausea, vomiting, diarrhea, abdominal pain, melena, hematochezia, indigestion/heartburn, hematuria, incontinence (some urge on the way to the bathroom in the morning), dysuria, vaginal bleeding, discharge, odor or itch, genital lesions, joint pains (just in fingers/hands/thumbs, known OA), numbness (rare in the left arm), tingling, weakness, tremor, suspicious skin lesions (sees derm yearly), abnormal bleeding/bruising, or enlarged lymph nodes. Allergies flaring this week (especially in her eyes). Hot flashes at night or with stress; much less frequent (gabapentin helps) Moods--stable with meds and treatment. Fatigue per HPI. Flutter in chest per HPI Chest pain per PI Constipation is controlled by Miralax.   PHYSICAL EXAM:  BP 98/60   Pulse 64   Temp 98.4 F (36.9 C)  (Other (Comment))   Ht _0  (1.626 m)   Wt 122 lb 3.2 oz (55.4 kg)   BMI 20.98 kg/m   Wt Readings from Last 3 Encounters:  05/18/19 122 lb 12.8 oz (55.7 kg)  09/26/18 124 lb 9.6 oz (56.5 kg)  09/05/18 125 lb 3.2 oz (56.8 kg)    General Appearance:    Alert, cooperative, no distress, appears stated age  Head:    Normocephalic, without obvious abnormality, atraumatic  Eyes:    PERRL, conjunctiva are mildly injected bilaterally.  No drainage or crusting. EOM's intact, fundi benign  Ears:    Normal TM's and external ear canals  Nose:   Not examined, wearing mask due to COVID-19 pandemic  Throat:   Not examined, wearing mask due to COVID-19 pandemic  Neck:   Supple, no lymphadenopathy;  thyroid:  no enlargement/ tenderness/nodules; no carotid bruit or JVD  Back:    Spine nontender, no curvature, ROM normal, no CVA   tenderness  Lungs:     Clear to auscultation bilaterally without wheezes, rales or   ronchi; respirations unlabored  Chest Wall:    Absence of breasts.  WHSS (horizontal) bilaterally. No axillary lymphadenopathy. Some soft tissue discomfort at lateral chest wall (lateral to the incision). No bony tenderness   Heart:    Regular rate and rhythm, S1 and S2 normal, no murmur, rub   or gallop  Breast Exam:   See chest wall above.  Abdomen:     Soft,  non-tender, nondistended, normoactive bowel sounds,    no masses, no hepatosplenomegaly  Genitalia:    Deferred to GYN     Extremities:   No clubbing, cyanosis or edema  Pulses:   2+ and symmetric all extremities  Skin:   Skin color, texture, turgor normal, no rashes or lesions  Lymph nodes:   Cervical, supraclavicular, and axillary nodes normal  Neurologic:   CNII-XII intact, normal strength, sensation and gait; reflexes 2+ and symmetric throughout                                Psych:   Normal mood, affect, hygiene and grooming.   ASSESSMENT/PLAN:  Annual physical exam  Osteopenia of neck of left femur - Discussed Ca, D,  weight-bearing. abnl FRAX, no osteoporosis-Prolia not covered. Prefers not to restart bisphosphonate. Thyroid issues contrib. recheck 2 yrs  Hypothyroidism, unspecified type - over-replaced per recent TSH done at Kristen Gamble.  She will contact her office to get mediation adjusted.   GAD (generalized anxiety disorder) - stable  Personal history of breast cancer - L breast, s/p bilateral mastectomies, doing well without recurrence or complication. Sees Kristen Gamble yearly  Palpitations - may be related to over-replaced thyroid; will hold off on cardiology referral to see if improves with lower dose  Chest wall pain - poss neuropathy (on gabapentin), poss component of muscular (lat dorsi, related to resuming exercise)  Seasonal allergic rhinitis, unspecified trigger - flaring now--cont flonase, may also use daily antihistamine and antihistamine eye drops prn   Lipids were going to be done (to assess her cardiovascular risk), however thyroid dose is not correct, which can affect lipids.  Will hold off on checking lipids again until euthyroid.  (doubt cardiac etiology to her symptoms of pain, suspect PVC's).  Recommended at least 30 minutes of aerobic activity at least 5 days/week, weight-bearing exercise at least 2x/week; proper sunscreen use reviewed; healthy diet, including goals of calcium and vitamin D intake and alcohol recommendations (less than or equal to 1 drink/day) reviewed; regular seatbelt use; changing batteries in smoke detectors.  Immunization recommendations discussed--continue yearly flu shots.  Shingrix recommended, risks/SE reviewed. To schedule NV when convenient after checking insurance. Colonoscopy recommendations reviewed--UTD.  We discussed your bone density test, showing osteopenia with elevated FRAX score (risk for fracture).  This puts you as a candidate for treatment.  Due to your tolerance issues with bisphosphonates in the past, as well as knowing that Prolia will NOT  be covered (need to have diagnosis of osteoporosis), we elected to hold off on treatment.  We know that your over-replacement of thyroid hormone is contributing to increased bone loss.  Be sure to follow up with Kristen Gamble on your thyroid medication.  Work on getting weight-bearing exercise, and continuing your calcium and vitamin D intake/supplementation.

## 2019-05-23 NOTE — Patient Instructions (Addendum)
  HEALTH MAINTENANCE RECOMMENDATIONS:  It is recommended that you get at least 30 minutes of aerobic exercise at least 5 days/week (for weight loss, you may need as much as 60-90 minutes). This can be any activity that gets your heart rate up. This can be divided in 10-15 minute intervals if needed, but try and build up your endurance at least once a week.  Weight bearing exercise is also recommended twice weekly.  Eat a healthy diet with lots of vegetables, fruits and fiber.  "Colorful" foods have a lot of vitamins (ie green vegetables, tomatoes, red peppers, etc).  Limit sweet tea, regular sodas and alcoholic beverages, all of which has a lot of calories and sugar.  Up to 1 alcoholic drink daily may be beneficial for women (unless trying to lose weight, watch sugars).  Drink a lot of water.  Calcium recommendations are 1200-1500 mg daily (1500 mg for postmenopausal women or women without ovaries), and vitamin D 1000 IU daily.  This should be obtained from diet and/or supplements (vitamins), and calcium should not be taken all at once, but in divided doses.  Sunscreen of at least SPF 30 should be used on all sun-exposed parts of the skin when outside between the hours of 10 am and 4 pm (not just when at beach or pool, but even with exercise, golf, tennis, and yard work!)  Use a sunscreen that says "broad spectrum" so it covers both UVA and UVB rays, and make sure to reapply every 1-2 hours.  Remember to change the batteries in your smoke detectors when changing your clock times in the spring and fall.  Use your seat belt every time you are in a car, and please drive safely and not be distracted with cell phones and texting while driving.  I recommend getting the new shingles vaccine (Shingrix). You will need to check with your insurance to see if it is covered, and if covered, schedule a nurse visit when convenient.  It is a series of 2 injections, spaced 2 months apart.  We discussed your bone  density test, showing osteopenia with elevated FRAX score (risk for fracture).  This puts you as a candidate for treatment.  Due to your tolerance issues with bisphosphonates in the past, as well as knowing that Prolia will NOT be covered (need to have diagnosis of osteoporosis), we elected to hold off on treatment.  We know that your over-replacement of thyroid hormone is contributing to increased bone loss.  Be sure to follow up with Dr. Chalmers Cater on your thyroid medication.  Work on getting weight-bearing exercise, and continuing your calcium and vitamin D intake/supplementation.

## 2019-05-25 ENCOUNTER — Encounter: Payer: Self-pay | Admitting: Family Medicine

## 2019-05-25 ENCOUNTER — Other Ambulatory Visit: Payer: Self-pay

## 2019-05-25 ENCOUNTER — Ambulatory Visit (INDEPENDENT_AMBULATORY_CARE_PROVIDER_SITE_OTHER): Payer: BC Managed Care – PPO | Admitting: Family Medicine

## 2019-05-25 VITALS — BP 98/60 | HR 64 | Temp 98.4°F | Ht 64.0 in | Wt 122.2 lb

## 2019-05-25 DIAGNOSIS — R002 Palpitations: Secondary | ICD-10-CM

## 2019-05-25 DIAGNOSIS — E039 Hypothyroidism, unspecified: Secondary | ICD-10-CM

## 2019-05-25 DIAGNOSIS — Z0000 Encounter for general adult medical examination without abnormal findings: Secondary | ICD-10-CM

## 2019-05-25 DIAGNOSIS — F411 Generalized anxiety disorder: Secondary | ICD-10-CM

## 2019-05-25 DIAGNOSIS — J302 Other seasonal allergic rhinitis: Secondary | ICD-10-CM

## 2019-05-25 DIAGNOSIS — Z853 Personal history of malignant neoplasm of breast: Secondary | ICD-10-CM

## 2019-05-25 DIAGNOSIS — M85852 Other specified disorders of bone density and structure, left thigh: Secondary | ICD-10-CM | POA: Diagnosis not present

## 2019-05-25 DIAGNOSIS — R0789 Other chest pain: Secondary | ICD-10-CM

## 2019-05-26 DIAGNOSIS — E039 Hypothyroidism, unspecified: Secondary | ICD-10-CM | POA: Diagnosis not present

## 2019-05-29 DIAGNOSIS — F4323 Adjustment disorder with mixed anxiety and depressed mood: Secondary | ICD-10-CM | POA: Diagnosis not present

## 2019-06-04 DIAGNOSIS — Z20828 Contact with and (suspected) exposure to other viral communicable diseases: Secondary | ICD-10-CM | POA: Diagnosis not present

## 2019-06-09 ENCOUNTER — Telehealth: Payer: Self-pay | Admitting: Family Medicine

## 2019-06-09 NOTE — Telephone Encounter (Signed)
Received requested records from [facility]

## 2019-06-10 ENCOUNTER — Encounter: Payer: Self-pay | Admitting: Family Medicine

## 2019-06-10 ENCOUNTER — Other Ambulatory Visit: Payer: Self-pay

## 2019-06-10 ENCOUNTER — Ambulatory Visit (INDEPENDENT_AMBULATORY_CARE_PROVIDER_SITE_OTHER): Payer: BC Managed Care – PPO | Admitting: Family Medicine

## 2019-06-10 VITALS — Temp 97.9°F | Ht 64.0 in | Wt 118.0 lb

## 2019-06-10 DIAGNOSIS — J302 Other seasonal allergic rhinitis: Secondary | ICD-10-CM

## 2019-06-10 DIAGNOSIS — J40 Bronchitis, not specified as acute or chronic: Secondary | ICD-10-CM | POA: Diagnosis not present

## 2019-06-10 DIAGNOSIS — R05 Cough: Secondary | ICD-10-CM | POA: Diagnosis not present

## 2019-06-10 DIAGNOSIS — R059 Cough, unspecified: Secondary | ICD-10-CM

## 2019-06-10 MED ORDER — AZITHROMYCIN 250 MG PO TABS: ORAL_TABLET | ORAL | 0 refills | Status: DC

## 2019-06-10 NOTE — Progress Notes (Signed)
Start time: 12:30 End time: 12:44  Virtual Visit via Video Note  I connected with Kristen Gamble on 06/10/19 by a video enabled telemedicine application and verified that I am speaking with the correct person using two identifiers.  Location: Patient: home Provider: office   I discussed the limitations of evaluation and management by telemedicine and the availability of in person appointments. The patient expressed understanding and agreed to proceed.  History of Present Illness:  Chief Complaint  Patient presents with  . Cough    scratchy throat and head congestion. Not really getting anything out through her nose. Chest is really tight. Started a few weeks ago when she was raking leaves at her mom's house. Mucus is clear. Had fever (low grade) two weeks ago and chills.    Felt like allergies at first. Starting feeling bad 12 days ago.  Very tired, (possibly also from her thyroid, dose was decreased after that). Had a LG fever a couple of weeks ago x 2 days, then resolved. She had a lot of pressure behind her eyes.  She started Claritin and flonase the weekend after her last visit here. She had gotten a little worse, but then felt better, but then overdid it around the house, and now feels bad again. Also taking sudafed.  She has cough, scratchy throat. Some hoarseness and PND.  Nasal drainage is clear.  Phlegm is sometimes slightly yellow.  Cough is productive, clear to slightly yellowish. Some chest tightness, hurts to cough.  PMH, PSH, SH reviewed  Outpatient Encounter Medications as of 06/10/2019  Medication Sig Note  . fluticasone (FLONASE) 50 MCG/ACT nasal spray SHAKE LIQUID AND USE 2 SPRAYS IN EACH NOSTRIL DAILY   . gabapentin (NEURONTIN) 300 MG capsule Take 1 capsule (300 mg total) by mouth at bedtime.   Marland Kitchen L-Methylfolate-Algae (DEPLIN 15 PO) Take 1 capsule by mouth daily.    Marland Kitchen loratadine (CLARITIN) 10 MG tablet Take 10 mg by mouth daily.   . Multiple Vitamins-Minerals  (MULTIVITAMIN WITH MINERALS) tablet Take 1 tablet daily by mouth.   . phenylephrine (SUDAFED PE) 10 MG TABS tablet Take 10 mg by mouth every 4 (four) hours as needed.   . polyethylene glycol (MIRALAX / GLYCOLAX) packet Take 17 g by mouth daily as needed.   . thyroid (NP THYROID) 60 MG tablet Take 60 mg by mouth daily before breakfast. 06/10/2019: Does not take on Wed or Sunday  . traZODone (DESYREL) 100 MG tablet TAKE 2 TABLETS(200 MG) BY MOUTH AT BEDTIME   . vitamin B-12 (CYANOCOBALAMIN) 100 MCG tablet Take 100 mcg by mouth daily.   . Zinc 22.5 MG TABS Take 1 tablet by mouth daily.   Marland Kitchen azithromycin (ZITHROMAX) 250 MG tablet Take 2 tablets by mouth on first day, then 1 tablet by mouth on days 2 through 5   . guaiFENesin (MUCINEX) 600 MG 12 hr tablet Take 600 mg by mouth 2 (two) times daily. 05/25/2019: Uses prn  . [DISCONTINUED] cetirizine (ZYRTEC) 10 MG chewable tablet Chew 10 mg by mouth daily.    No facility-administered encounter medications on file as of 06/10/2019.    (NOT TAKING ZPAK prior to visit)  Allergies  Allergen Reactions  . Prednisone Palpitations  . Morphine Itching    REACTION: itching REACTION: itching    ROS:  No further fevers.  Headaches improved.  No nausea, vomiting or shortness of breath.  +diarrhea.  See HPI     Observations/Objective:  Temp 97.9 F (36.6 C) (Other (Comment))  Ht 5\' 4"  (1.626 m)   Wt 118 lb (53.5 kg)   BMI 20.25 kg/m   Well-appearing, pleasant female, appearing comfortable, in no distress Intermittently she is coughing, voice gets crackly, relieved by clearing her throat or coughing. She is speaking easily in full sentences. She is alert, oriented, in good spirits. Exam is limited by virtual nature of the visit.    Assessment and Plan:  Bronchitis - cough over 10 days with some discolored phlegm. Treat with z-pak.  Discussed potential for viral (including COVID) - Plan: azithromycin (ZITHROMAX) 250 MG tablet  Cough - PND  contributing. Continue allergy meds, sudafed, add Mucinex DM  Seasonal allergic rhinitis, unspecified trigger - continue current meds.  Can't tolerate prednisone, so declined dosepak   Add Robitussin DM or Mucinex DM Cont allergy regimen z-pak  GI distress and palpitations from prednisone  Follow Up Instructions:    I discussed the assessment and treatment plan with the patient. The patient was provided an opportunity to ask questions and all were answered. The patient agreed with the plan and demonstrated an understanding of the instructions.   The patient was advised to call back or seek an in-person evaluation if the symptoms worsen or if the condition fails to improve as anticipated.  I provided 14 minutes of non-face-to-face time during this encounter.   Vikki Ports, MD

## 2019-06-10 NOTE — Patient Instructions (Addendum)
Drink plenty of water. Continue flonase (2 sprays each nostril daily), claritin, and sudafed as needed for sinus pain. Consider sinus rinses if sinus congestion/pain recurs.  Add either Mucinex DM or Robitussin DM (for the expectorant and cough suppressant).  Take the z-pak as directed.  Contact us of symptoms persist/worsen/change in any way.

## 2019-06-17 ENCOUNTER — Encounter: Payer: Self-pay | Admitting: Family Medicine

## 2019-06-17 DIAGNOSIS — Z20818 Contact with and (suspected) exposure to other bacterial communicable diseases: Secondary | ICD-10-CM | POA: Diagnosis not present

## 2019-06-19 ENCOUNTER — Other Ambulatory Visit: Payer: Self-pay | Admitting: Psychiatry

## 2019-06-19 NOTE — Telephone Encounter (Signed)
Still no apt since 07/2018

## 2019-06-22 NOTE — Telephone Encounter (Signed)
Call to reschedule her

## 2019-06-22 NOTE — Telephone Encounter (Signed)
Has an appointment in january

## 2019-07-07 DIAGNOSIS — E039 Hypothyroidism, unspecified: Secondary | ICD-10-CM | POA: Diagnosis not present

## 2019-07-22 ENCOUNTER — Encounter: Payer: Self-pay | Admitting: Psychiatry

## 2019-07-22 ENCOUNTER — Ambulatory Visit (INDEPENDENT_AMBULATORY_CARE_PROVIDER_SITE_OTHER): Payer: BC Managed Care – PPO | Admitting: Psychiatry

## 2019-07-22 ENCOUNTER — Other Ambulatory Visit: Payer: Self-pay

## 2019-07-22 DIAGNOSIS — F900 Attention-deficit hyperactivity disorder, predominantly inattentive type: Secondary | ICD-10-CM

## 2019-07-22 DIAGNOSIS — F5105 Insomnia due to other mental disorder: Secondary | ICD-10-CM

## 2019-07-22 DIAGNOSIS — F4001 Agoraphobia with panic disorder: Secondary | ICD-10-CM | POA: Diagnosis not present

## 2019-07-22 DIAGNOSIS — F331 Major depressive disorder, recurrent, moderate: Secondary | ICD-10-CM

## 2019-07-22 DIAGNOSIS — F411 Generalized anxiety disorder: Secondary | ICD-10-CM | POA: Diagnosis not present

## 2019-07-22 MED ORDER — TRAZODONE HCL 100 MG PO TABS: 200.0000 mg | ORAL_TABLET | Freq: Every day | ORAL | 3 refills | Status: DC

## 2019-07-22 NOTE — Progress Notes (Signed)
Kristen Gamble [id number] 08-13-1960 60 y.o.  Subjective:   Patient ID:  Kristen Gamble is a 59 y.o. (DOB 1960-12-26) female.  Chief Complaint:  Chief Complaint  Patient presents with  . Anxiety    Medication Management  . Depression    Medication Management  . ADHD    Medication Management    Anxiety Patient reports no confusion, decreased concentration, nervous/anxious behavior or suicidal ideas.    Depression        Associated symptoms include no decreased concentration and no suicidal ideas.  Past medical history includes anxiety.    last Kristen Gamble presents to the office today for follow-up of depression.  Last seen January 2020.  No meds were changed.  She remained on gabapentin 300 nightly, Deplin 15, trazodone 200 nightly.  Good holidays.  Still doing well.  Son came home in March.  Still counseling.  Kristen Gamble is going to [employer] [place].  He's doing so much better.  Some worry about alcohol with him but does not appear out of hand.  Covid helped bring the family together.  Helped [place] to heal.  Thankful.  Stressful fall with Kristen Gamble with eventual pacemaker.  Is having more ADHD sx and ran out of NAC  Depression is better with med changes and therapy and support group.  Depression is managed.  Exercise helps anxiety. Couple panic with triggers but not severe.  Patient reports stable mood and denies depressed or irritable moods.  Patient reports episodic difficulty with anxiety.  Patient denies difficulty with sleep initiation or maintenance. Denies appetite disturbance.  Patient reports that energy and motivation have been good.  Patient denies any difficulty with concentration.  Patient denies any suicidal ideation.  Sleep good.  Exercise a lot with [place] and loves it.  No alcohol.  Seeing Kristen Gamble in therapy monthly and it's helpful.  Also attending [facility].  Father was also an alcoholic.  Gabapentin for neuropathy.  Prior psychiatric medications include methylphenidate, mirtazapine 45 mg,  trazodone, Deplin, N-acetylcysteine.  She last took stimulants December 2018.  Review of Systems:  Review of Systems  Neurological: Negative for tremors and weakness.       Neuropathy better.  Psychiatric/Behavioral: Positive for depression. Negative for agitation, behavioral problems, confusion, decreased concentration, dysphoric mood, hallucinations, self-injury, sleep disturbance and suicidal ideas. The patient is not nervous/anxious and is not hyperactive.     Medications: I have reviewed the patient's current medications.  Current Outpatient Medications  Medication Sig Dispense Refill  . fluticasone (FLONASE) 50 MCG/ACT nasal spray SHAKE LIQUID AND USE 2 SPRAYS IN EACH NOSTRIL DAILY 16 g 2  . gabapentin (NEURONTIN) 300 MG capsule Take 1 capsule (300 mg total) by mouth at bedtime. 90 capsule 4  . guaiFENesin (MUCINEX) 600 MG 12 hr tablet Take 600 mg by mouth 2 (two) times daily.    Marland Kitchen L-Methylfolate-Algae (DEPLIN 15 PO) Take 1 capsule by mouth daily.     Marland Kitchen loratadine (CLARITIN) 10 MG tablet Take 10 mg by mouth daily.    . Multiple Vitamins-Minerals (MULTIVITAMIN WITH MINERALS) tablet Take 1 tablet daily by mouth.    . phenylephrine (SUDAFED PE) 10 MG TABS tablet Take 10 mg by mouth every 4 (four) hours as needed.    . polyethylene glycol (MIRALAX / GLYCOLAX) packet Take 17 g by mouth daily as needed.    . thyroid (NP THYROID) 60 MG tablet Take 60 mg by mouth daily before breakfast.    . traZODone (DESYREL) 100 MG tablet TAKE 2  TABLETS(200 MG) BY MOUTH AT BEDTIME 180 tablet 0  . vitamin B-12 (CYANOCOBALAMIN) 100 MCG tablet Take 100 mcg by mouth daily.    . Zinc 22.5 MG TABS Take 1 tablet by mouth daily.     No current facility-administered medications for this visit.    Medication Side Effects: None  Allergies:  Allergies  Allergen Reactions  . Prednisone Palpitations  . Morphine Itching    REACTION: itching REACTION: itching    Past Medical History:  Diagnosis Date  .  ADHD (attention deficit hyperactivity disorder) 08/2011  . Arthritis   . Breast cancer ([place]) 10/2006   left  . Complication of anesthesia    nausea  . Constipation   . Family history of breast cancer   . Family history of melanoma   . Family history of rectal cancer   . Generalized anxiety disorder   . Hematuria   . History of kidney stones   . Major depression    treated by Dr. Clovis Pu  . Nephrolithiasis    and stone distal R ureter on CT and R hydronephrosis  . Numbness    under l arm (resolved/intermittent)  . Osteopenia 05/2013   T score minus 1.7  . PONV (postoperative nausea and vomiting)   . TMJ (temporomandibular joint disorder)     Family History  Problem Relation Age of Onset  . Diabetes Father   . Alcohol abuse Father   . Depression Father   . Ulcers Father   . Stomach cancer Father 57       treated with partial gastrectomy. Died of other causes at 27.  . Diabetes Maternal Grandmother   . Hypertension Maternal Grandmother   . Heart attack Maternal Grandmother   . Heart disease Maternal Grandmother   . Hypothyroidism Maternal Grandmother   . Melanoma Maternal Grandmother 60       d.90  . Hypertension Maternal Grandfather   . Stroke Maternal Grandfather   . Asthma Maternal Grandfather   . Colon polyps Maternal Grandfather   . Heart attack Paternal Grandfather   . Heart disease Paternal Grandfather 63       died of MI  . Arthritis Mother   . Hypothyroidism Mother   . Heart disease Mother 29       pacemaker, afib  . COPD Mother   . Diabetes Brother   . Breast cancer Paternal Aunt 17       d.70s  . Rectal cancer Cousin 40       d.21s Daughter of unaffected maternal uncle.    Social History   Socioeconomic History  . Marital status: Married    Spouse name: Not on file  . Number of children: 4  . Years of education: Not on file  . Highest education level: Not on file  Occupational History  . 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 Smoking status: Never Smoker  . Smokeless tobacco: Never Used  Substance and Sexual Activity  . Alcohol use: Yes    Alcohol/week: 1.0 standard drinks    Types: 1 Glasses of wine per week    Comment: 1 glass/week (cut back related to son't issues)  . Drug use: No  . Sexual activity: Yes    Partners: Male    Birth control/protection: Surgical    Comment: HYST  Other Topics Concern  . Not on file  Social History Narrative   Lives with her husband, 2 dogs, 2 cats.  She has boy/girl twins, and  a daughter who graduated from [employer] and is in medical school at [employer]. Joslyn Hy is married, has 2 granddaughters.   Son (24 yo) alcoholic; was sent to rehab, and in sober living environment in [place] 03/2018.    In college in [place], doing well.   Helps care for her mother (who now lives nearby)   Social Determinants of Health   Financial Resource Strain:   . Difficulty of Paying Living Expenses: Not on file  Food Insecurity:   . Worried About Charity fundraiser in the Last Year: Not on file  . Ran Out of Food in the Last Year: Not on file  Transportation Needs:   . Lack of Transportation (Medical): Not on file  . Lack of Transportation (Non-Medical): Not on file  Physical Activity:   . Days of Exercise per Week: Not on file  . Minutes of Exercise per Session: Not on file  Stress:   . Feeling of Stress : Not on file  Social Connections:   . Frequency of Communication with Friends and Family: Not on file  . Frequency of Social Gatherings with Friends and Family: Not on file  . Attends Religious Services: Not on file  . Active Member of Clubs or Organizations: Not on file  . Attends Archivist Meetings: Not on file  . Marital Status: Not on file  Intimate Partner Violence:   . Fear of Current or Ex-Partner: Not on file  . Emotionally Abused: Not on file  . Physically Abused: Not on file  . Sexually Abused: Not on file    Past Medical History, Surgical history,  Social history, and Family history were reviewed and updated as appropriate.   Please see review of systems for further details on the patient's review from today.   Enjoys to granddaughters 71 and 59 years old  Objective:   Physical Exam:  There were no vitals taken for this visit.  Physical Exam Constitutional:      General: She is not in acute distress.    Appearance: She is well-developed.  Musculoskeletal:        General: No deformity.  Neurological:     Mental Status: She is alert and oriented to person, place, and time.     Motor: No tremor.     Coordination: Coordination normal.     Gait: Gait normal.  Psychiatric:        Attention and Perception: Attention and perception normal.        Mood and Affect: Mood is not anxious or depressed. Affect is not labile, blunt, angry or inappropriate.        Speech: Speech normal.        Behavior: Behavior normal.        Thought Content: Thought content normal. Thought content does not include homicidal or suicidal ideation. Thought content does not include homicidal or suicidal plan.        Cognition and Memory: Cognition normal.        Judgment: Judgment normal.     Comments: Insight intact. No auditory or visual hallucinations. No delusions.      Lab Review:     Component Value Date/Time   NA 142 05/18/2019 1213   NA 141 02/02/2015 0805   K 4.6 05/18/2019 1213   K 4.1 02/02/2015 0805   CL 105 05/18/2019 1213   CO2 27 05/18/2019 1213   CO2 26 02/02/2015 0805   GLUCOSE 103 (H) 05/18/2019 1213   GLUCOSE  99 04/25/2016 1020   GLUCOSE 80 02/02/2015 0805   BUN 18 05/18/2019 1213   BUN 20.3 02/02/2015 0805   CREATININE 0.58 05/18/2019 1213   CREATININE 0.7 02/02/2015 0805   CALCIUM 9.6 05/18/2019 1213   CALCIUM 10.6 (H) 02/02/2015 0805   PROT 6.7 05/18/2019 1213   PROT 6.9 02/02/2015 0805   ALBUMIN 4.5 05/18/2019 1213   ALBUMIN 4.0 02/02/2015 0805   AST 25 05/18/2019 1213   AST 26 02/02/2015 0805   ALT 27 05/18/2019  1213   ALT 28 02/02/2015 0805   ALKPHOS 67 05/18/2019 1213   ALKPHOS 83 02/02/2015 0805   BILITOT 0.2 05/18/2019 1213   BILITOT 0.38 02/02/2015 0805   GFRNONAA 103 05/18/2019 1213   GFRAA 118 05/18/2019 1213       Component Value Date/Time   WBC 4.6 05/18/2019 1213   WBC 4.7 04/25/2016 1020   RBC 4.23 05/18/2019 1213   RBC 4.14 04/25/2016 1020   HGB 13.4 05/18/2019 1213   HGB 13.8 02/02/2015 0805   HCT 39.1 05/18/2019 1213   HCT 41.4 02/02/2015 0805   PLT 211 05/18/2019 1213   MCV 92 05/18/2019 1213   MCV 96.1 02/02/2015 0805   MCH 31.7 05/18/2019 1213   MCH 33.1 04/25/2016 1020   MCHC 34.3 05/18/2019 1213   MCHC 35.0 04/25/2016 1020   RDW 12.7 05/18/2019 1213   RDW 13.5 02/02/2015 0805   LYMPHSABS 1.8 05/18/2019 1213   LYMPHSABS 1.7 02/02/2015 0805   MONOABS 0.3 04/25/2016 1020   MONOABS 0.4 02/02/2015 0805   EOSABS 0.0 05/18/2019 1213   BASOSABS 0.0 05/18/2019 1213   BASOSABS 0.0 02/02/2015 0805    No results found for: POCLITH, LITHIUM   No results found for: PHENYTOIN, PHENOBARB, VALPROATE, CBMZ   .res Assessment: Plan:    Major depressive disorder, recurrent episode, moderate (HCC)  Generalized anxiety disorder  Panic disorder with agoraphobia  Insomnia due to mental condition  Attention deficit hyperactivity disorder (ADHD), predominantly inattentive type  Done and symptoms have been largely controlled through the combination of increasing the trazodone, therapy with Kristen Gamble and [facility].  She is satisfied with her current treatment.  Restart NAC 1200 mg daily for ADD off label. Other options if needed.  She appears to be fairly medication sensitive but is tolerating these meds well.  The NAC is being used off label for ADD and cognitive reasons.  She does not want to take stimulants.  Follow up 1 year  Lynder Parents, MD, DFAPA  Please see After Visit Summary for patient specific instructions.  Future Appointments  Date Time Provider  [place]  09/07/2019  1:00 PM Magrinat, Virgie Dad, MD [facility] None  06/16/2020  8:45 AM Rita Ohara, MD [facility]    No orders of the defined types were placed in this encounter.     -------------------------------

## 2019-07-27 DIAGNOSIS — F4323 Adjustment disorder with mixed anxiety and depressed mood: Secondary | ICD-10-CM | POA: Diagnosis not present

## 2019-08-21 ENCOUNTER — Other Ambulatory Visit: Payer: Self-pay

## 2019-08-21 MED ORDER — DEPLIN 15 15-90.314 MG PO CAPS: 15.0000 mg | ORAL_CAPSULE | Freq: Every day | ORAL | 3 refills | Status: DC

## 2019-09-06 NOTE — Progress Notes (Signed)
[place]  [phone]) [phone] Fax:(336) [phone]     ID: Kristen Gamble   DOB: July 07, 1961  MR#: [id number]  BFX#:[id number]  Patient Care Team: Rita Ohara, MD as PCP - General (Family Medicine) Rodolph Hagemann, Virgie Dad, MD (Hematology and Oncology) [name], Belinda Block, MD as Consulting Physician (Gynecology) Dian Queen, MD as Consulting Physician (Obstetrics and Gynecology) Leta Baptist, MD as Consulting Physician (Otolaryngology) Sueanne Margarita, MD as Consulting Physician (Cardiology) Ronald Lobo, MD as Consulting Physician (Gastroenterology) OTHER MD:    CHIEF COMPLAINT: Estrogen receptor positive breast cancer (s/p bilateral mastectomies)  CURRENT TREATMENT: Observation    INTERVAL HISTORY: Kristen Gamble returns today for follow-up of her estrogen receptor positive breast cancer.  The interval history is generally unremarkable.  She is exercising almost daily, using Pel[employer], walking, and with a trainer.  At her last visit on 08/27/2018, she reported pain to her left ribcage since removal of her implants. She underwent left rib x-rays 09/05/2018, which was negative.  Since her last visit here, she has been tested for coronavirus twice, on 03/16/2019 and 05/11/2019, both of which were negative. Of note, she also tested negative for influenza on 09/26/2018.   REVIEW OF SYSTEMS: Kristen Gamble has had pain in the left chest area about for 5 months.  Sometimes when she takes a deep breath it hurts in that area as well.  She is not aware of any trauma although of course she did have bilateral mastectomies.  Recall her cancer was on the right side.  Aside from these issues a detailed review of systems today was stable   HISTORY OF PRESENT ILLNESS: From the original intake note:   I do not have records before 10/2006, but in that month the patient underwent bilateral mastectomy for left sided breast cancer, which proved to be an invasive ductal carcinoma, grade 1, measuring 6 mm, in the  setting of DCIS (grade 2).  One of the margins was positive focally.  The tumor was ER and PR more than 95% positive, Hercept test negative (at 0/1), and she had an Oncotype recurrence score of 13 predicting a distant risk of recurrence of 8% if she took tamoxifen for 5 years.  In 11/2006 the patient underwent a second excision to clear the margins.  There was a 5 mm residual focus of tumor.  Left axillary lymph node sampling showed a negative sentinel lymph node.  Margins were negative, although close (margins for DCIS were less than 1 mm, and for the invasive tumor less than 2 mm, both deep).  She had lateral subpectoral implants placed, and has been on Femara with fairly significant side effects that are a major source of concern to the patient.  In addition, the patient underwent TAH/BSO 03/2007 with benign pathology.  She has also had multiple other breast biopsies (12/2006, two right sided benign breast lesions; 08/2007, removal of a dermatofibroma; 12/2007, removal of a 2 mm left breast mass which was benign, and 04/2008 a left breast biopsy which proved to be scar).  The patient has had extensive studies including CT of chest, abdomen, and pelvis 12/2007, which was negative.  She had a bone scan in 05/2007 which was negative.   Her subsequent history is as detailed below.   PAST MEDICAL HISTORY: Past Medical History:  Diagnosis Date  . ADHD (attention deficit hyperactivity disorder) 08/2011  . Arthritis   . Breast cancer ([place]) 10/2006   left  . Complication of anesthesia    nausea  . Constipation   .  Family history of breast cancer   . Family history of melanoma   . Family history of rectal cancer   . Generalized anxiety disorder   . Hematuria   . History of kidney stones   . Major depression    treated by Dr. Clovis Pu  . Nephrolithiasis    and stone distal R ureter on CT and R hydronephrosis  . Numbness    under l arm (resolved/intermittent)  . Osteopenia 05/2013   T score minus  1.7  . PONV (postoperative nausea and vomiting)   . TMJ (temporomandibular joint disorder)     PAST SURGICAL HISTORY: Past Surgical History:  Procedure Laterality Date  . ABDOMINAL HYSTERECTOMY    . AUGMENTATION MAMMAPLASTY  1999  . BREAST BIOPSY     x4 in 2009-2012, all benign  . BREAST SURGERY  2008   Lumpectomy-Bilateral mastectomy-implants  . COLPOSCOPY    . CYSTOSCOPY    . CYSTOSCOPY WITH RETROGRADE PYELOGRAM, URETEROSCOPY AND STENT PLACEMENT Left 11/05/2014   Procedure: CYSTOSCOPY WITH RETROGRADE PYELOGRAM, URETEROSCOPY AND STENT PLACEMENT;  Surgeon: Franchot Gallo, MD;  Location: [facility];  Service: Urology;  Laterality: Left;  . CYSTOSCOPY/RETROGRADE/URETEROSCOPY/STONE EXTRACTION WITH BASKET Right 11/13/2013   Procedure: CYSTOSCOPY, RIGHT RETROGRADE PYLOGRAM WITH INTERPRETATION, RIGHT URETEROSCOPY WITH DILATION OF URETERAL STRICTURE WITH BASKET EXTRACTION OF STONE;  Surgeon: Ailene Rud, MD;  Location: [facility];  Service: Urology;  Laterality: Right;  . FOOT SURGERY  2014   Right big toe  . GYNECOLOGIC CRYOSURGERY  age 55  . LAPAROSCOPIC TOTAL HYSTERECTOMY    . MASTECTOMY Bilateral 01-2007  . PARATHYROIDECTOMY N/A 07/07/2015   Procedure: PARATHYROIDECTOMY;  Surgeon: Armandina Gemma, MD;  Location: [facility];  Service: General;  Laterality: N/A;  . PELVIC LAPAROSCOPY     LAP HYST BSO  . STONE EXTRACTION WITH BASKET Left 11/05/2014   Procedure: STONE EXTRACTION WITH BASKET;  Surgeon: Franchot Gallo, MD;  Location: [facility];  Service: Urology;  Laterality: Left;  . TONSILLECTOMY  1971  . TOTAL ABDOMINAL HYSTERECTOMY W/ BILATERAL SALPINGOOPHORECTOMY  9/08    FAMILY HISTORY: Family History  Problem Relation Age of Onset  . Diabetes Father   . Alcohol abuse Father   . Depression Father   . Ulcers Father   . Stomach cancer Father 95       treated with partial gastrectomy. Died of other causes at 61.  . Diabetes Maternal Grandmother   .  Hypertension Maternal Grandmother   . Heart attack Maternal Grandmother   . Heart disease Maternal Grandmother   . Hypothyroidism Maternal Grandmother   . Melanoma Maternal Grandmother 60       d.90  . Hypertension Maternal Grandfather   . Stroke Maternal Grandfather   . Asthma Maternal Grandfather   . Colon polyps Maternal Grandfather   . Heart attack Paternal Grandfather   . Heart disease Paternal Grandfather 30       died of MI  . Arthritis Mother   . Hypothyroidism Mother   . Heart disease Mother 48       pacemaker, afib  . COPD Mother   . Diabetes Brother   . Breast cancer Paternal Aunt 79       d.70s  . Rectal cancer Cousin 40       d.22s Daughter of unaffected maternal uncle.   The patient's father died at the age of 79 in the setting of alcoholism.  The patient's mother is alive. There is a brother in good  health.  There is no other history of breast cancer or ovarian cancer in the family.   GYNECOLOGIC HISTORY: She is GX, P2.  First pregnancy to term age 58.  She has had significant problems with hot flashes and vaginal dryness since she had her TAH/BSO in September of 2008.   SOCIAL HISTORY: (updated February 2021). Kristen Gamble is very active in our [employer]. 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  They work from home.  Their daughter Kristen Gamble, is a Museum/gallery exhibitions officer in medical school at [employer]. Their second child, Kristen Gamble, died at the age of 2.5 from a viral myocarditis.  Kristen Gamble has a son, Kristen Gamble, from a prior marriage. Kristen Gamble and Kristen Gamble have also adopted a pair of twins: The girl is currently at the [place] of the arts and the boy is going to college in [place].. The patient attends [employer] DIRECTIVES: in place   HEALTH MAINTENANCE: Social History   Tobacco Use  . Smoking status: Never Smoker  . Smokeless tobacco: Never Used  Substance Use Topics  . Alcohol use: Yes     Alcohol/week: 1.0 standard drinks    Types: 1 Glasses of wine per week    Comment: 1 glass/week (cut back related to son't issues)  . Drug use: No     Colonoscopy: 2015, f/u recommended in 3 years  PAP: s/p TAH/BSO  Bone density: 07/2018, -2.0  Lipid panel: 2013  Allergies  Allergen Reactions  . Prednisone Palpitations  . Morphine Itching    REACTION: itching REACTION: itching    Current Outpatient Medications  Medication Sig Dispense Refill  . fluticasone (FLONASE) 50 MCG/ACT nasal spray SHAKE LIQUID AND USE 2 SPRAYS IN EACH NOSTRIL DAILY 16 g 2  . gabapentin (NEURONTIN) 300 MG capsule Take 1 capsule (300 mg total) by mouth at bedtime. 90 capsule 4  . guaiFENesin (MUCINEX) 600 MG 12 hr tablet Take 600 mg by mouth 2 (two) times daily.    Marland Kitchen L-Methylfolate-Algae (DEPLIN 15) 15-90.314 MG CAPS Take 15 mg by mouth daily. 90 capsule 3  . loratadine (CLARITIN) 10 MG tablet Take 10 mg by mouth daily.    . Multiple Vitamins-Minerals (MULTIVITAMIN WITH MINERALS) tablet Take 1 tablet daily by mouth.    . phenylephrine (SUDAFED PE) 10 MG TABS tablet Take 10 mg by mouth every 4 (four) hours as needed.    . polyethylene glycol (MIRALAX / GLYCOLAX) packet Take 17 g by mouth daily as needed.    . thyroid (NP THYROID) 60 MG tablet Take 60 mg by mouth daily before breakfast.    . traZODone (DESYREL) 100 MG tablet Take 2 tablets (200 mg total) by mouth at bedtime. 180 tablet 3  . vitamin B-12 (CYANOCOBALAMIN) 100 MCG tablet Take 100 mcg by mouth daily.    . Zinc 22.5 MG TABS Take 1 tablet by mouth daily.     No current facility-administered medications for this visit.    OBJECTIVE: Middle-aged white woman who appears younger than stated age 24:   09/07/19 1254  BP: 102/65  Pulse: (!) 122  Resp: 18  Temp: 98.2 F (36.8 C)  SpO2: 94%     Body mass index is 22.02 kg/m.    ECOG FS: 1  Sclerae unicteric, EOMs intact Wearing a mask No cervical or supraclavicular adenopathy Lungs no  rales or rhonchi Heart regular rate and rhythm Abd soft, nontender, positive bowel sounds MSK no  focal spinal tenderness, no upper extremity lymphedema Neuro: nonfocal, well oriented, appropriate affect Breasts: Status post bilateral mastectomy.  There is no evidence of chest wall recurrence.  Palpation of the chest wall on the left does elicit mild to moderate discomfort.   LAB RESULTS: Lab Results  Component Value Date   WBC 4.6 05/18/2019   NEUTROABS 2.5 05/18/2019   HGB 13.4 05/18/2019   HCT 39.1 05/18/2019   MCV 92 05/18/2019   PLT 211 05/18/2019      Chemistry      Component Value Date/Time   NA 142 05/18/2019 1213   NA 141 02/02/2015 0805   K 4.6 05/18/2019 1213   K 4.1 02/02/2015 0805   CL 105 05/18/2019 1213   CO2 27 05/18/2019 1213   CO2 26 02/02/2015 0805   BUN 18 05/18/2019 1213   BUN 20.3 02/02/2015 0805   CREATININE 0.58 05/18/2019 1213   CREATININE 0.7 02/02/2015 0805      Component Value Date/Time   CALCIUM 9.6 05/18/2019 1213   CALCIUM 10.6 (H) 02/02/2015 0805   ALKPHOS 67 05/18/2019 1213   ALKPHOS 83 02/02/2015 0805   AST 25 05/18/2019 1213   AST 26 02/02/2015 0805   ALT 27 05/18/2019 1213   ALT 28 02/02/2015 0805   BILITOT 0.2 05/18/2019 1213   BILITOT 0.38 02/02/2015 0805       Lab Results  Component Value Date   LABCA2 27 02/04/2012    No components found for: FEOFH219  No results for input(s): INR in the last 168 hours.  Urinalysis    Component Value Date/Time   COLORURINE YELLOW 10/28/2013 1010   APPEARANCEUR CLOUDY (A) 10/28/2013 1010   LABSPEC 1.020 04/10/2018 1427   PHURINE 7.5 10/28/2013 1010   GLUCOSEU NEG 10/28/2013 1010   HGBUR LARGE (A) 10/28/2013 1010   BILIRUBINUR negative 04/10/2018 1427   BILIRUBINUR neg 01/03/2015 1336   KETONESUR negative 04/10/2018 1427   KETONESUR NEG 10/28/2013 1010   PROTEINUR negative 04/10/2018 1427   PROTEINUR neg 01/03/2015 1336   PROTEINUR NEG 10/28/2013 1010   UROBILINOGEN  negative 01/03/2015 1336   UROBILINOGEN 0.2 10/28/2013 1010   NITRITE Negative 04/10/2018 1427   NITRITE neg 01/03/2015 1336   NITRITE NEG 10/28/2013 1010   LEUKOCYTESUR Trace (A) 04/10/2018 1427    STUDIES: No results found.   ASSESSMENT: 59 y.o. [place] woman status post bilateral mastectomies with subpectoral implant placement July 2008 for a T1c N0, stage IA invasive ductal carcinoma, grade 1, strongly estrogen and progesterone receptor positive, HER2/neu negative,   (a) Oncotype DX recurrence score of 13, predicted an 8% risk of recurrence outside the breast if her only systemic therapy was 5 years of tamoxifen.    (1) She started antiestrogens September 2008, initially tamoxifen, then Femara with Lupron, switched to tamoxifen again November 2009, stopped tamoxifen at her own discretion December 2011, resumed July 2012,  discontinued July 2015 , when the patient went off Vagifem suppositories  (2) osteopenia:  (a) scan 06/05/2013 showed a T score of -1.7  (b) DEXA scan 08/15/2018 shows a T score of -2.0  (3) status post bilateral silicone implant reconstruction  (a) implants removed secondary to leakage, not replaced   PLAN: Cherylin is now 12-1/2 years out from definitive surgery for her breast cancer with no evidence of disease recurrence.  This is very favorable.  I think what she is experiencing in the left chest wall area is going to be musculoskeletal, possibly related to the remote trauma  of surgery and reconstruction or possibly related to her very active exercise program.  In any case, since we do not have an answer and she already had plain films of the ribs on that area, I am recommending we obtain a CT of the chest.  This should clear the issue and then she can continue to deal with the pain without having the added worried that it may be due to breast cancer.  As far as dealing with the pain is concerned I suggested occasional use of ice packs, Aleve, stretching,  massage, and trying to figure out if she is doing something exercise wise that makes it worse and then either avoid or modify that.  I am delighted at how well she is doing.  She will see me again in 1 year.  She knows to call for any other issue that may develop before then.  Total encounter time 25 minutes.*  Arianny Pun, Virgie Dad, MD  09/07/19 1:27 PM Medical Oncology and Hematology [facility] [place], [place] [address] Tel. [phone]    Fax. [phone]   I, Wilburn Mylar, am acting as scribe for Dr. Virgie Dad. Blyss Lugar.  I, Lurline Del MD, have reviewed the above documentation for accuracy and completeness, and I agree with the above.    *Total Encounter Time as defined by the Centers for Medicare and Medicaid Services includes, in addition to the face-to-face time of a patient visit (documented in the note above) non-face-to-face time: obtaining and reviewing outside history, ordering and reviewing medications, tests or procedures, care coordination (communications with other health care professionals or caregivers) and documentation in the medical record.

## 2019-09-07 ENCOUNTER — Other Ambulatory Visit: Payer: Self-pay

## 2019-09-07 ENCOUNTER — Inpatient Hospital Stay: Payer: BC Managed Care – PPO | Attending: Oncology | Admitting: Oncology

## 2019-09-07 VITALS — BP 102/65 | HR 122 | Temp 98.2°F | Resp 18 | Ht 64.0 in | Wt 128.3 lb

## 2019-09-07 DIAGNOSIS — Z90722 Acquired absence of ovaries, bilateral: Secondary | ICD-10-CM | POA: Insufficient documentation

## 2019-09-07 DIAGNOSIS — C50812 Malignant neoplasm of overlapping sites of left female breast: Secondary | ICD-10-CM | POA: Insufficient documentation

## 2019-09-07 DIAGNOSIS — E210 Primary hyperparathyroidism: Secondary | ICD-10-CM | POA: Insufficient documentation

## 2019-09-07 DIAGNOSIS — Z90710 Acquired absence of both cervix and uterus: Secondary | ICD-10-CM | POA: Diagnosis not present

## 2019-09-07 DIAGNOSIS — Z170 Estrogen receptor positive status [ER+]: Secondary | ICD-10-CM | POA: Diagnosis not present

## 2019-09-07 DIAGNOSIS — M8580 Other specified disorders of bone density and structure, unspecified site: Secondary | ICD-10-CM | POA: Diagnosis not present

## 2019-09-07 DIAGNOSIS — Z79899 Other long term (current) drug therapy: Secondary | ICD-10-CM | POA: Insufficient documentation

## 2019-09-07 DIAGNOSIS — M1990 Unspecified osteoarthritis, unspecified site: Secondary | ICD-10-CM | POA: Insufficient documentation

## 2019-09-07 DIAGNOSIS — Z9013 Acquired absence of bilateral breasts and nipples: Secondary | ICD-10-CM | POA: Insufficient documentation

## 2019-09-07 DIAGNOSIS — Z79811 Long term (current) use of aromatase inhibitors: Secondary | ICD-10-CM | POA: Diagnosis not present

## 2019-09-07 MED ORDER — GABAPENTIN 300 MG PO CAPS: 300.0000 mg | ORAL_CAPSULE | Freq: Every day | ORAL | 4 refills | Status: DC

## 2019-09-08 ENCOUNTER — Telehealth: Payer: Self-pay | Admitting: Oncology

## 2019-09-08 DIAGNOSIS — Z01419 Encounter for gynecological examination (general) (routine) without abnormal findings: Secondary | ICD-10-CM | POA: Diagnosis not present

## 2019-09-08 DIAGNOSIS — Z6822 Body mass index (BMI) 22.0-22.9, adult: Secondary | ICD-10-CM | POA: Diagnosis not present

## 2019-09-08 NOTE — Telephone Encounter (Signed)
I talk with patient regarding schedule  

## 2019-09-09 DIAGNOSIS — Z01419 Encounter for gynecological examination (general) (routine) without abnormal findings: Secondary | ICD-10-CM | POA: Diagnosis not present

## 2019-09-17 ENCOUNTER — Other Ambulatory Visit: Payer: Self-pay

## 2019-09-17 ENCOUNTER — Ambulatory Visit (HOSPITAL_COMMUNITY)
Admission: RE | Admit: 2019-09-17 | Discharge: 2019-09-17 | Disposition: A | Discharge status: Home / Self Care | Payer: BC Managed Care – PPO | Source: Ambulatory Visit | Attending: Oncology | Admitting: Oncology

## 2019-09-17 ENCOUNTER — Encounter (HOSPITAL_COMMUNITY): Payer: Self-pay

## 2019-09-17 DIAGNOSIS — E210 Primary hyperparathyroidism: Secondary | ICD-10-CM | POA: Diagnosis not present

## 2019-09-17 DIAGNOSIS — Z9013 Acquired absence of bilateral breasts and nipples: Secondary | ICD-10-CM | POA: Diagnosis not present

## 2019-09-17 DIAGNOSIS — M8580 Other specified disorders of bone density and structure, unspecified site: Secondary | ICD-10-CM

## 2019-09-17 DIAGNOSIS — C50812 Malignant neoplasm of overlapping sites of left female breast: Secondary | ICD-10-CM | POA: Diagnosis not present

## 2019-09-17 DIAGNOSIS — Z170 Estrogen receptor positive status [ER+]: Secondary | ICD-10-CM | POA: Insufficient documentation

## 2019-09-17 DIAGNOSIS — C50919 Malignant neoplasm of unspecified site of unspecified female breast: Secondary | ICD-10-CM | POA: Diagnosis not present

## 2019-09-17 MED ORDER — IOHEXOL 300 MG/ML  SOLN
100.0000 mL | Freq: Once | INTRAMUSCULAR | Status: AC | PRN
  Administered 2019-09-17: 75 mL via INTRAVENOUS

## 2019-09-17 MED ORDER — SODIUM CHLORIDE (PF) 0.9 % IJ SOLN
INTRAMUSCULAR | Status: AC
  Filled 2019-09-17: qty 50

## 2019-09-18 ENCOUNTER — Encounter: Payer: Self-pay | Admitting: Oncology

## 2019-09-22 ENCOUNTER — Other Ambulatory Visit: Payer: Self-pay | Admitting: Psychiatry

## 2019-09-22 DIAGNOSIS — F4001 Agoraphobia with panic disorder: Secondary | ICD-10-CM

## 2019-09-22 DIAGNOSIS — F331 Major depressive disorder, recurrent, moderate: Secondary | ICD-10-CM

## 2019-09-22 DIAGNOSIS — F411 Generalized anxiety disorder: Secondary | ICD-10-CM

## 2019-09-22 DIAGNOSIS — F5105 Insomnia due to other mental disorder: Secondary | ICD-10-CM

## 2020-02-18 DIAGNOSIS — M13842 Other specified arthritis, left hand: Secondary | ICD-10-CM | POA: Diagnosis not present

## 2020-04-05 DIAGNOSIS — E039 Hypothyroidism, unspecified: Secondary | ICD-10-CM | POA: Diagnosis not present

## 2020-04-05 DIAGNOSIS — E210 Primary hyperparathyroidism: Secondary | ICD-10-CM | POA: Diagnosis not present

## 2020-04-05 DIAGNOSIS — M8580 Other specified disorders of bone density and structure, unspecified site: Secondary | ICD-10-CM | POA: Diagnosis not present

## 2020-04-18 DIAGNOSIS — H16292 Other keratoconjunctivitis, left eye: Secondary | ICD-10-CM | POA: Diagnosis not present

## 2020-04-19 ENCOUNTER — Emergency Department (HOSPITAL_COMMUNITY): Payer: BC Managed Care – PPO

## 2020-04-19 ENCOUNTER — Encounter (HOSPITAL_COMMUNITY): Payer: Self-pay | Admitting: Emergency Medicine

## 2020-04-19 ENCOUNTER — Emergency Department (HOSPITAL_COMMUNITY)
Admission: EM | Admit: 2020-04-19 | Discharge: 2020-04-20 | Disposition: A | Discharge status: Home / Self Care | Payer: BC Managed Care – PPO | Attending: Emergency Medicine | Admitting: Emergency Medicine

## 2020-04-19 DIAGNOSIS — R109 Unspecified abdominal pain: Secondary | ICD-10-CM | POA: Diagnosis not present

## 2020-04-19 DIAGNOSIS — M545 Low back pain, unspecified: Secondary | ICD-10-CM | POA: Insufficient documentation

## 2020-04-19 DIAGNOSIS — Z7951 Long term (current) use of inhaled steroids: Secondary | ICD-10-CM | POA: Insufficient documentation

## 2020-04-19 DIAGNOSIS — K429 Umbilical hernia without obstruction or gangrene: Secondary | ICD-10-CM | POA: Diagnosis not present

## 2020-04-19 DIAGNOSIS — N200 Calculus of kidney: Secondary | ICD-10-CM | POA: Diagnosis not present

## 2020-04-19 DIAGNOSIS — M5136 Other intervertebral disc degeneration, lumbar region: Secondary | ICD-10-CM | POA: Diagnosis not present

## 2020-04-19 DIAGNOSIS — M5137 Other intervertebral disc degeneration, lumbosacral region: Secondary | ICD-10-CM | POA: Diagnosis not present

## 2020-04-19 LAB — HEPATIC FUNCTION PANEL
ALT: 50 U/L — ABNORMAL HIGH (ref 0–44)
AST: 31 U/L (ref 15–41)
Albumin: 4.6 g/dL (ref 3.5–5.0)
Alkaline Phosphatase: 69 U/L (ref 38–126)
Bilirubin, Direct: 0.1 mg/dL (ref 0.0–0.2)
Indirect Bilirubin: 0.5 mg/dL (ref 0.3–0.9)
Total Bilirubin: 0.6 mg/dL (ref 0.3–1.2)
Total Protein: 7.4 g/dL (ref 6.5–8.1)

## 2020-04-19 LAB — BASIC METABOLIC PANEL
Anion gap: 10 (ref 5–15)
BUN: 14 mg/dL (ref 6–20)
CO2: 26 mmol/L (ref 22–32)
Calcium: 9.3 mg/dL (ref 8.9–10.3)
Chloride: 106 mmol/L (ref 98–111)
Creatinine, Ser: 0.58 mg/dL (ref 0.44–1.00)
GFR calc non Af Amer: >60 mL/min (ref >60)
Glucose, Bld: 118 mg/dL — ABNORMAL HIGH (ref 70–99)
Potassium: 3.7 mmol/L (ref 3.5–5.1)
Sodium: 142 mmol/L (ref 135–145)

## 2020-04-19 LAB — CBC
HCT: 38.8 % (ref 36.0–46.0)
Hemoglobin: 13.5 g/dL (ref 12.0–15.0)
MCH: 32.5 pg (ref 26.0–34.0)
MCHC: 34.8 g/dL (ref 30.0–36.0)
MCV: 93.3 fL (ref 80.0–100.0)
Platelets: 204 10*3/uL (ref 150–400)
RBC: 4.16 MIL/uL (ref 3.87–5.11)
RDW: 12.5 % (ref 11.5–15.5)
WBC: 5.2 10*3/uL (ref 4.0–10.5)
nRBC: 0 % (ref 0.0–0.2)

## 2020-04-19 LAB — URINALYSIS, ROUTINE W REFLEX MICROSCOPIC
Bilirubin Urine: NEGATIVE
Glucose, UA: NEGATIVE mg/dL
Hgb urine dipstick: NEGATIVE
Ketones, ur: NEGATIVE mg/dL
Leukocytes,Ua: NEGATIVE
Nitrite: NEGATIVE
Protein, ur: NEGATIVE mg/dL
Specific Gravity, Urine: 1.004 — ABNORMAL LOW (ref 1.005–1.030)
pH: 6 (ref 5.0–8.0)

## 2020-04-19 LAB — I-STAT BETA HCG BLOOD, ED (MC, WL, AP ONLY): I-stat hCG, quantitative: <5 m[IU]/mL (ref <5)

## 2020-04-19 LAB — LIPASE, BLOOD: Lipase: 34 U/L (ref 11–51)

## 2020-04-19 MED ORDER — FENTANYL CITRATE (PF) 100 MCG/2ML IJ SOLN
50.0000 ug | Freq: Once | INTRAMUSCULAR | Status: AC
  Administered 2020-04-19: 50 ug via INTRAVENOUS
  Filled 2020-04-19: qty 2

## 2020-04-19 MED ORDER — KETOROLAC TROMETHAMINE 15 MG/ML IJ SOLN
15.0000 mg | Freq: Once | INTRAMUSCULAR | Status: AC
  Administered 2020-04-19: 15 mg via INTRAVENOUS
  Filled 2020-04-19: qty 1

## 2020-04-19 MED ORDER — LIDOCAINE 5 % EX PTCH: 1.0000 | MEDICATED_PATCH | CUTANEOUS | 0 refills | Status: DC

## 2020-04-19 MED ORDER — LIDOCAINE 5 % EX PTCH
1.0000 | MEDICATED_PATCH | CUTANEOUS | Status: DC
  Administered 2020-04-19: 1 via TRANSDERMAL
  Filled 2020-04-19: qty 1

## 2020-04-19 NOTE — ED Provider Notes (Signed)
[facility] DEPT Provider Note   CSN: [id number] Arrival date & time: 04/19/20  1718     History Chief Complaint  Patient presents with  . Back Pain  . Flank Pain    Kristen Gamble is a 59 y.o. female history of ADHD, breast cancer, constipation, kidney stones, depression, osteopenia, hysterectomy.  Patient presents today for right flank pain onset yesterday after waking up describes a sharp tight sensation nonradiating worsened with movement and palpation slightly improved with methocarbamol and ibuprofen, pain currently severe in intensity.  She feels pain may be similar to previous kidney stone pain or a pulled muscle.  Denies fever/chills, fall/injury, cough/shortness of breath, chest pain, abdominal pain, vomiting, diarrhea, dysuria/hematuria, vaginaldischarge, numbness/weakness, tingling, saddle paresthesias, bowel/bladder incontinence or additional concerns. HPI     Past Medical History:  Diagnosis Date  . ADHD (attention deficit hyperactivity disorder) 08/2011  . Arthritis   . Breast cancer ([place]) 10/2006   left  . Complication of anesthesia    nausea  . Constipation   . Family history of breast cancer   . Family history of melanoma   . Family history of rectal cancer   . Generalized anxiety disorder   . Hematuria   . History of kidney stones   . Major depression    treated by Dr. Clovis Pu  . Nephrolithiasis    and stone distal R ureter on CT and R hydronephrosis  . Numbness    under l arm (resolved/intermittent)  . Osteopenia 05/2013   T score minus 1.7  . PONV (postoperative nausea and vomiting)   . TMJ (temporomandibular joint disorder)     Patient Active Problem List   Diagnosis Date Noted  . Malignant neoplasm of overlapping sites of left breast in female, estrogen receptor positive ([place]) 09/07/2019  . Osteopenia 09/07/2019  . GAD (generalized anxiety disorder) 07/13/2018  . Panic 07/13/2018  . Insomnia 07/13/2018  . Genetic  testing 10/15/2017  . Family history of breast cancer   . Family history of rectal cancer   . Family history of melanoma   . Hyperparathyroidism, primary ([place]) 07/05/2015  . Breast cancer, left breast ([place]) 02/09/2015  . TMJ (temporomandibular joint disorder)   . ADHD (attention deficit hyperactivity disorder) 10/22/2011  . TMJ disease 10/22/2011  . Allergic rhinitis 09/17/2011  . Hot flashes 09/17/2011  . AV BLOCK, 1ST DEGREE 03/17/2010  . CHEST PAIN-UNSPECIFIED 03/17/2010  . ARTHRITIS, HAND 03/10/2010  . PALPITATIONS 03/10/2010  . MASTECTOMY, BILATERAL, HX OF 03/10/2010    Past Surgical History:  Procedure Laterality Date  . ABDOMINAL HYSTERECTOMY    . AUGMENTATION MAMMAPLASTY  1999  . BREAST BIOPSY     x4 in 2009-2012, all benign  . BREAST SURGERY  2008   Lumpectomy-Bilateral mastectomy-implants  . COLPOSCOPY    . CYSTOSCOPY    . CYSTOSCOPY WITH RETROGRADE PYELOGRAM, URETEROSCOPY AND STENT PLACEMENT Left 11/05/2014   Procedure: CYSTOSCOPY WITH RETROGRADE PYELOGRAM, URETEROSCOPY AND STENT PLACEMENT;  Surgeon: Franchot Gallo, MD;  Location: [facility];  Service: Urology;  Laterality: Left;  . CYSTOSCOPY/RETROGRADE/URETEROSCOPY/STONE EXTRACTION WITH BASKET Right 11/13/2013   Procedure: CYSTOSCOPY, RIGHT RETROGRADE PYLOGRAM WITH INTERPRETATION, RIGHT URETEROSCOPY WITH DILATION OF URETERAL STRICTURE WITH BASKET EXTRACTION OF STONE;  Surgeon: Ailene Rud, MD;  Location: [facility];  Service: Urology;  Laterality: Right;  . FOOT SURGERY  2014   Right big toe  . GYNECOLOGIC CRYOSURGERY  age 9  . LAPAROSCOPIC TOTAL HYSTERECTOMY    . MASTECTOMY Bilateral 01-2007  .  PARATHYROIDECTOMY N/A 07/07/2015   Procedure: PARATHYROIDECTOMY;  Surgeon: Armandina Gemma, MD;  Location: [facility];  Service: General;  Laterality: N/A;  . PELVIC LAPAROSCOPY     LAP HYST BSO  . STONE EXTRACTION WITH BASKET Left 11/05/2014   Procedure: STONE EXTRACTION WITH BASKET;  Surgeon: Franchot Gallo, MD;  Location: [facility];  Service: Urology;  Laterality: Left;  . TONSILLECTOMY  1971  . TOTAL ABDOMINAL HYSTERECTOMY W/ BILATERAL SALPINGOOPHORECTOMY  9/08     OB History    Gravida  2   Para  2   Term  2   Preterm      AB      Living  1     SAB      TAB      Ectopic      Multiple      Live Births              Family History  Problem Relation Age of Onset  . Diabetes Father   . Alcohol abuse Father   . Depression Father   . Ulcers Father   . Stomach cancer Father 41       treated with partial gastrectomy. Died of other causes at 29.  . Diabetes Maternal Grandmother   . Hypertension Maternal Grandmother   . Heart attack Maternal Grandmother   . Heart disease Maternal Grandmother   . Hypothyroidism Maternal Grandmother   . Melanoma Maternal Grandmother 60       d.90  . Hypertension Maternal Grandfather   . Stroke Maternal Grandfather   . Asthma Maternal Grandfather   . Colon polyps Maternal Grandfather   . Heart attack Paternal Grandfather   . Heart disease Paternal Grandfather 32       died of MI  . Arthritis Mother   . Hypothyroidism Mother   . Heart disease Mother 65       pacemaker, afib  . COPD Mother   . Diabetes Brother   . Breast cancer Paternal Aunt 53       d.70s  . Rectal cancer Cousin 40       d.1s Daughter of unaffected maternal uncle.    Social History   Tobacco Use  . Smoking status: Never Smoker  . Smokeless tobacco: Never Used  Vaping Use  . Vaping Use: Never used  Substance Use Topics  . Alcohol use: Yes    Alcohol/week: 1.0 standard drink    Types: 1 Glasses of wine per week    Comment: 1 glass/week (cut back related to son't issues)  . Drug use: No    Home Medications Prior to Admission medications   Medication Sig Start Date End Date Taking? Authorizing Provider  fluticasone (FLONASE) 50 MCG/ACT nasal spray SHAKE LIQUID AND USE 2 SPRAYS IN EACH NOSTRIL DAILY 03/19/19   Rita Ohara, MD    gabapentin (NEURONTIN) 300 MG capsule Take 1 capsule (300 mg total) by mouth at bedtime. 09/07/19   Magrinat, Virgie Dad, MD  L-Methylfolate-Algae (DEPLIN 15) 15-90.314 MG CAPS Take 15 mg by mouth daily. 08/21/19   Cottle, Billey Co., MD  lidocaine (LIDODERM) 5 % Place 1 patch onto the skin daily. Remove & Discard patch within 12 hours or as directed by MD 04/19/20   Nuala Alpha A, PA-C  loratadine (CLARITIN) 10 MG tablet Take 10 mg by mouth daily.    [provider]  Multiple Vitamins-Minerals (MULTIVITAMIN WITH MINERALS) tablet Take 1 tablet daily by mouth.  [provider]  polyethylene glycol (MIRALAX / GLYCOLAX) packet Take 17 g by mouth daily as needed.    [provider]  thyroid (NP THYROID) 60 MG tablet Take 60 mg by mouth daily before breakfast.    [provider]  traZODone (DESYREL) 100 MG tablet TAKE 2 TABLETS(200 MG) BY MOUTH AT BEDTIME 09/22/19   Cottle, Billey Co., MD  vitamin B-12 (CYANOCOBALAMIN) 100 MCG tablet Take 100 mcg by mouth daily.    [provider]  Zinc 22.5 MG TABS Take 1 tablet by mouth daily.    [provider]    Allergies    Prednisone and Morphine  Review of Systems   Review of Systems Ten systems are reviewed and are negative for acute change except as noted in the HPI  Physical Exam Updated Vital Signs BP (!) 120/94 (BP Location: Left Arm)   Pulse 62   Temp 97.9 F (36.6 C) (Oral)   Resp (!) 22   SpO2 100%   Physical Exam Constitutional:      General: She is not in acute distress.    Appearance: Normal appearance. She is well-developed. She is not ill-appearing or diaphoretic.  HENT:     Head: Normocephalic and atraumatic.  Eyes:     General: Vision grossly intact. Gaze aligned appropriately.     Pupils: Pupils are equal, round, and reactive to light.  Neck:     Trachea: Trachea and phonation normal.  Cardiovascular:     Rate and Rhythm: Normal rate and regular rhythm.     Pulses:           Dorsalis pedis pulses are 2+ on the right side and 2+ on the left side.  Pulmonary:     Effort: Pulmonary effort is normal. No respiratory distress.     Breath sounds: Normal air entry.  Chest:     Chest wall: No tenderness.  Abdominal:     General: There is no distension.     Palpations: Abdomen is soft.     Tenderness: There is no abdominal tenderness. There is right CVA tenderness. There is no left CVA tenderness, guarding or rebound.  Musculoskeletal:        General: Normal range of motion.     Cervical back: Normal range of motion.       Back:     Comments: Right lumbar paraspinal muscular tenderness with palpable muscle spasm no surrounding skin changes.  No midline C/T/L spinal tenderness to palpation, no deformity, crepitus, or step-off noted. No sign of injury to the neck or back.  Feet:     Right foot:     Protective Sensation: 3 sites tested. 3 sites sensed.     Left foot:     Protective Sensation: 3 sites tested. 3 sites sensed.  Skin:    General: Skin is warm and dry.  Neurological:     Mental Status: She is alert.     GCS: GCS eye subscore is 4. GCS verbal subscore is 5. GCS motor subscore is 6.     Comments: Speech is clear and goal oriented, follows commands Major Cranial nerves without deficit, no facial droop Moves extremities without ataxia, coordination intact  Psychiatric:        Behavior: Behavior normal.    ED Results / Procedures / Treatments   Labs (all labs ordered are listed, but only abnormal results are displayed) Labs Reviewed  URINALYSIS, ROUTINE W REFLEX MICROSCOPIC - Abnormal; Notable for the following components:  Result Value   Color, Urine COLORLESS (*)    Specific Gravity, Urine 1.004 (*)    All other components within normal limits  BASIC METABOLIC PANEL - Abnormal; Notable for the following components:   Glucose, Bld 118 (*)    All other components within normal limits  HEPATIC FUNCTION PANEL - Abnormal; Notable for the  following components:   ALT 50 (*)    All other components within normal limits  CBC  LIPASE, BLOOD  I-STAT BETA HCG BLOOD, ED ([facility], [facility], [facility] ONLY)    EKG None  Radiology CT Renal Stone Study  Result Date: 04/19/2020 CLINICAL DATA:  Flank pain. Back pain radiating to the right flank. History of kidney stones. EXAM: CT ABDOMEN AND PELVIS WITHOUT CONTRAST TECHNIQUE: Multidetector CT imaging of the abdomen and pelvis was performed following the standard protocol without IV contrast. COMPARISON:  Contrast-enhanced CT 04/25/2016 FINDINGS: Lower chest: The lung bases are clear. Hepatobiliary: No focal liver abnormality is seen. No gallstones, gallbladder wall thickening, or biliary dilatation. Pancreas: No ductal dilatation or inflammation. Spleen: Normal in size without focal abnormality. Adrenals/Urinary Tract: Normal adrenal glands. No hydronephrosis or perinephric edema. There are 2 punctate nonobstructing stones in the lower left kidney. Both ureters are decompressed without stones along the course. Urinary bladder is completely empty. No evidence of bladder stone. Stomach/Bowel: Ingested material in the stomach. Normal positioning of the duodenum and ligament of Treitz. Unremarkable small bowel without obstruction or inflammation. Normal appendix. Moderate colonic stool burden. Sigmoid colonic tortuosity. No colonic wall thickening or pericolonic edema. Vascular/Lymphatic: Normal caliber abdominal aorta. No bulky abdominopelvic adenopathy. Reproductive: Hysterectomy without evidence of adnexal mass. Other: No free air or ascites. Tiny fat containing umbilical hernia. Musculoskeletal: There are no acute or suspicious osseous abnormalities. There is degenerative disc disease at L4-L5 and L5-S1 with vacuum phenomenon. No muscular findings to explain flank pain. IMPRESSION: 1. Two punctate nonobstructing stones in the lower left kidney. No hydronephrosis or obstructive uropathy. 2. Moderate colonic stool  burden with colonic tortuosity, can be seen with constipation. Electronically Signed   By: Keith Rake M.D.   On: 04/19/2020 21:05    Procedures Procedures (including critical care time)  Medications Ordered in ED Medications  lidocaine (LIDODERM) 5 % 1 patch (1 patch Transdermal Patch Applied 04/19/20 2326)  fentaNYL (SUBLIMAZE) injection 50 mcg (50 mcg Intravenous Given 04/19/20 2122)  fentaNYL (SUBLIMAZE) injection 50 mcg (50 mcg Intravenous Given 04/19/20 2218)  ketorolac (TORADOL) 15 MG/ML injection 15 mg (15 mg Intravenous Given 04/19/20 2326)    ED Course  I have reviewed the triage vital signs and the nursing notes.  Pertinent labs & imaging results that were available during my care of the patient were reviewed by me and considered in my medical decision making (see chart for details).    MDM Rules/Calculators/A&P                         Additional history obtained from: 1. Nursing notes from this visit. ------------------------------------ I ordered, reviewed and interpreted labs which include: Pregnancy test negative. Lipase within normal limits doubt pancreatitis. LFTs show ALT of 50 otherwise within normal limits no emergent elevations. CBC shows no leukocytosis to suggest infection and no anemia. BMP shows glucose 118 otherwise within normal limits, no emergent electrolyte derangement, AKI or gap. Urinalysis shows low specific gravity otherwise within normal limits no evidence of infection.  CT Renal Stone: IMPRESSION:  1. Two punctate nonobstructing stones in the  lower left kidney. No  hydronephrosis or obstructive uropathy.  2. Moderate colonic stool burden with colonic tortuosity, can be  seen with constipation.  - On reexamination patient is resting comfortably no acute distress her husband is at bedside. She has palpable muscle spasm of the right lumbar paraspinal muscles, no overlying skin changes, no shingles rash or bruising. Suspect patient's pain today  may be secondary to muscle spasm of that area. Additionally possibly patient had a kidney stone that was passed prior to CT imaging. She had been treated initially with fentanyl and then that was followed with 15 mg Toradol, patient denied any history of chronic kidney disease or history of gastric ulcers. She was reassessed shortly after receiving Toradol and reported improvement in her pain and requesting discharge. Patient has methocarbamol at home that she takes as needed I encourage patient to take methocarbamol 500 mg 3 times daily for the next 5 days. Will additionally give patient Lidoderm patches for comfort encourage warm compresses and follow-up with PCP. Patient was informed of all incidental findings today and will follow up with both her PCP and urology for a recheck. Additional causes of right lower back pain were considered, patient has no symptoms to suggest cauda equina, additionally no rashes to suggest shingles. No chest pain shortness of breath cough, hypoxia or tachycardia to suggest pulmonary embolism. No indication for further work-up at this time.  At this time there does not appear to be any evidence of an acute emergency medical condition and the patient appears stable for discharge with appropriate outpatient follow up. Diagnosis was discussed with patient who verbalizes understanding of care plan and is agreeable to discharge. I have discussed return precautions with patient and husband who verbalizes understanding. Patient encouraged to follow-up with their PCP. All questions answered.  Note: Portions of this report may have been transcribed using voice recognition software. Every effort was made to ensure accuracy; however, inadvertent computerized transcription errors may still be present. Final Clinical Impression(s) / ED Diagnoses Final diagnoses:  Acute right-sided low back pain without sciatica    Rx / DC Orders ED Discharge Orders         Ordered    lidocaine  (LIDODERM) 5 %  Every 24 hours        04/19/20 2353           Deliah Boston, PA-C 04/19/20 2356    Hayden Rasmussen, MD 04/20/20 1051

## 2020-04-19 NOTE — ED Triage Notes (Signed)
Per pt, states started having mid back pain yesterday-thought it was just a pulled muscle-states pain has gotten worse and radiating to right flank-history of kidney stones, last one 2016

## 2020-04-19 NOTE — ED Notes (Signed)
Patient transported to X-ray 

## 2020-04-19 NOTE — Discharge Instructions (Addendum)
At this time there does not appear to be the presence of an emergent medical condition, however there is always the potential for conditions to change. Please read and follow the below instructions.  Please return to the Emergency Department immediately for any new or worsening symptoms. Please be sure to follow up with your Primary Care Provider within one week regarding your visit today; please call their office to schedule an appointment even if you are feeling better for a follow-up visit. You have been given an NSAID-containing medication called Toradol today.  Do not take the medications including ibuprofen, Aleve, Advil, naproxen or other NSAID-containing medications for the next 2 days.  Please be sure to drink plenty of water over the next few days. You may use the muscle relaxer Robaxin 500 mg three times daily to help with your symptoms.  Do not drive or operate heavy machinery while taking Robaxin as it will make you drowsy.  Do not drink alcohol or take other sedating medications while taking Robaxin as this will worsen side effects. You may use the Lidoderm patch as prescribed to help with your symptoms.  Lidoderm may be expensive so you may speak with your pharmacist about finding over-the-counter medications that work similarly such as Probation officer. Your CT scan today showed two small kidney stones within your left kidney, please call the urologist Dr. Tresa Moore to schedule a follow-up appointment as the stones may cause pain in the future if they move. Your CT scan also showed moderate colonic stool burden with colonic tortuosity which could be seen in constipation. Also showed degenerative changes of your lumbar spine with vacuum phenomenon. Please discuss these incidental findings with your primary care provider at your follow-up visit.  Go to the nearest Emergency Department immediately if: You have fever or chills You develop new bowel or bladder control problems. You have unusual  weakness or numbness in your arms or legs. You develop nausea or vomiting. You develop abdominal pain. You feel faint. You get very bad pain. You get new pain in your belly (abdomen). You pass out (faint). You cannot pee. You have any new/concerning or worsening of symptoms  Please read the additional information packets attached to your discharge summary.  Do not take your medicine if  develop an itchy rash, swelling in your mouth or lips, or difficulty breathing; call 911 and seek immediate emergency medical attention if this occurs.  You may review your lab tests and imaging results in their entirety on your MyChart account.  Please discuss all results of fully with your primary care provider and other specialist at your follow-up visit.  Note: Portions of this text may have been transcribed using voice recognition software. Every effort was made to ensure accuracy; however, inadvertent computerized transcription errors may still be present.

## 2020-04-20 NOTE — ED Notes (Signed)
Discharged left with husband no concerns at this time.

## 2020-04-21 DIAGNOSIS — M545 Low back pain, unspecified: Secondary | ICD-10-CM | POA: Diagnosis not present

## 2020-04-25 DIAGNOSIS — E039 Hypothyroidism, unspecified: Secondary | ICD-10-CM | POA: Diagnosis not present

## 2020-04-25 DIAGNOSIS — E210 Primary hyperparathyroidism: Secondary | ICD-10-CM | POA: Diagnosis not present

## 2020-04-25 DIAGNOSIS — M8580 Other specified disorders of bone density and structure, unspecified site: Secondary | ICD-10-CM | POA: Diagnosis not present

## 2020-04-25 DIAGNOSIS — N200 Calculus of kidney: Secondary | ICD-10-CM | POA: Diagnosis not present

## 2020-04-25 DIAGNOSIS — Z23 Encounter for immunization: Secondary | ICD-10-CM | POA: Diagnosis not present

## 2020-05-04 DIAGNOSIS — M546 Pain in thoracic spine: Secondary | ICD-10-CM | POA: Diagnosis not present

## 2020-05-16 DIAGNOSIS — C4491 Basal cell carcinoma of skin, unspecified: Secondary | ICD-10-CM

## 2020-05-16 HISTORY — DX: Basal cell carcinoma of skin, unspecified: C44.91

## 2020-06-13 DIAGNOSIS — D2372 Other benign neoplasm of skin of left lower limb, including hip: Secondary | ICD-10-CM | POA: Diagnosis not present

## 2020-06-13 DIAGNOSIS — D1801 Hemangioma of skin and subcutaneous tissue: Secondary | ICD-10-CM | POA: Diagnosis not present

## 2020-06-13 DIAGNOSIS — C44519 Basal cell carcinoma of skin of other part of trunk: Secondary | ICD-10-CM | POA: Diagnosis not present

## 2020-06-15 ENCOUNTER — Ambulatory Visit (INDEPENDENT_AMBULATORY_CARE_PROVIDER_SITE_OTHER): Payer: BC Managed Care – PPO | Admitting: Family Medicine

## 2020-06-15 ENCOUNTER — Encounter: Payer: Self-pay | Admitting: Family Medicine

## 2020-06-15 ENCOUNTER — Other Ambulatory Visit: Payer: Self-pay

## 2020-06-15 VITALS — BP 90/56 | HR 72 | Temp 98.5°F | Ht 64.0 in | Wt 122.8 lb

## 2020-06-15 DIAGNOSIS — M270 Developmental disorders of jaws: Secondary | ICD-10-CM | POA: Diagnosis not present

## 2020-06-15 DIAGNOSIS — R002 Palpitations: Secondary | ICD-10-CM

## 2020-06-15 NOTE — Progress Notes (Signed)
Chief Complaint  Patient presents with  . Cyst    knot on the roof of her mouth that is somewhat sore-noticed it this weekend, so she doesn't think it has been there very long.    Patient presents with complaint of a sore lump on the right side of the roof of her mouth.  She only noticed it this weekend. Due for dentist within the month.  She also reports she has had some recurrent heart fluttering. Usually when she has had this in the past, it was related to her thyroid.  She checked with Dr. Chalmers Cater, last check was reportedly normal, in October.   She describes it as a fluttering, not associated with exertion, no dizziness, chest pain. Denies changes to caffeine, no decongestants.  +stress (family and work)  She previously saw Dr. Radford Pax.  Normal stress myoview in 12/2014.  Reports she would need referral back, "new patient" since over 5 years.   PMH, PSH, SH reviewed  Outpatient Encounter Medications as of 06/15/2020  Medication Sig Note  . cholecalciferol (VITAMIN D3) 25 MCG (1000 UNIT) tablet Take 1,000 Units by mouth daily.   . fluticasone (FLONASE) 50 MCG/ACT nasal spray SHAKE LIQUID AND USE 2 SPRAYS IN EACH NOSTRIL DAILY   . gabapentin (NEURONTIN) 300 MG capsule Take 1 capsule (300 mg total) by mouth at bedtime.   Marland Kitchen L-Methylfolate-Algae (DEPLIN 15) 15-90.314 MG CAPS Take 15 mg by mouth daily.   Marland Kitchen loratadine (CLARITIN) 10 MG tablet Take 10 mg by mouth daily.   . Multiple Vitamins-Minerals (MULTIVITAMIN WITH MINERALS) tablet Take 1 tablet daily by mouth.   . polyethylene glycol (MIRALAX / GLYCOLAX) packet Take 17 g by mouth daily as needed.   . thyroid (NP THYROID) 60 MG tablet Take 60 mg by mouth daily before breakfast. 06/10/2019: Does not take on Wed or Sunday  . traZODone (DESYREL) 100 MG tablet TAKE 2 TABLETS(200 MG) BY MOUTH AT BEDTIME   . vitamin B-12 (CYANOCOBALAMIN) 100 MCG tablet Take 100 mcg by mouth daily.   . Zinc 22.5 MG TABS Take 1 tablet by mouth daily.   .  [DISCONTINUED] lidocaine (LIDODERM) 5 % Place 1 patch onto the skin daily. Remove & Discard patch within 12 hours or as directed by MD    No facility-administered encounter medications on file as of 06/15/2020.   Allergies  Allergen Reactions  . Prednisone Palpitations  . Morphine Itching    REACTION: itching REACTION: itching   ROS: no fever, chills, URI symptoms.  Mouth pain per HPI.  Palpitations per HPI.  No headaches, dizziness, chest pain, shortness of breath, syncope. No edema.  +stress, but moods okay overall. See HPI   PHYSICAL EXAM:  BP (!) 90/56   Pulse 72   Temp 98.5 F (36.9 C) (Tympanic)   Ht 5\' 4"  (1.626 m)   Wt 122 lb 12.8 oz (55.7 kg)   BMI 21.08 kg/m   Well-appearing, thin female, in good spirits, in no distress HEENT: conjunctiva and sclera are clear, EOMI.  OP--small torus palatinus; the right side is mildly inflamed--slightly pink and larger than on the left.  Remainder of OP is clear Neck: no lymphadenopathy, thyromegaly or mass, no bruit Heart: regular rate and rhythm, no murmur, rub, gallop or ectopy Lungs: clear bilaterally Extremities: no edema Psych: normal mood, affect, hygiene and grooming Neuro: alert and oriented, cranial nerves grossly intact. Normal gait.   ASSESSMENT/PLAN:  Torus palatinus - mild inflammation on R, likely from trauma (related to sharp food  item). Reassurred.  Palpitations - To track symptoms--triggers, pulse rate, associated symptoms. To f/u sooner than her r/s'd PE if persistent/worsening. Avoid decongestants, limit caffeine.   Torus pallatinus This is likely there all the time, but the one on the right side likely got irritated/inflamed by something.  Avoid chips or foods that may irritate, and it should go back to being nontender (and the same as the left side).  Pay attention to your pulse--try and check it when it seems fast (if >150 vs closer to 100 mean different things).  Also pay attention if the pulse seems  regular vs irregular. If you have any associated lightheadedness, chest pain or shortness of breath, see [place] right away (or to the ER if not resolving). Keep track of your symptoms.  Stay well hydrated, limit caffeine and alcohol. Work on stress reduction if that is a factor.

## 2020-06-15 NOTE — Patient Instructions (Signed)
  Torus pallatinus This is likely there all the time, but the one on the right side likely got irritated/inflamed by something.  Avoid chips or foods that may irritate, and it should go back to being nontender (and the same as the left side).  Pay attention to your pulse--try and check it when it seems fast (if >150 vs closer to 100 mean different things).  Also pay attention if the pulse seems regular vs irregular. If you have any associated lightheadedness, chest pain or shortness of breath, see [place] right away (or to the ER if not resolving). Keep track of your symptoms.  Stay well hydrated, limit caffeine and alcohol. Work on stress reduction if that is a factor.

## 2020-06-16 ENCOUNTER — Encounter: Payer: Self-pay | Admitting: Family Medicine

## 2020-06-16 ENCOUNTER — Encounter: Payer: BC Managed Care – PPO | Admitting: Family Medicine

## 2020-06-20 DIAGNOSIS — C44519 Basal cell carcinoma of skin of other part of trunk: Secondary | ICD-10-CM | POA: Diagnosis not present

## 2020-07-05 ENCOUNTER — Other Ambulatory Visit: Payer: Self-pay | Admitting: Psychiatry

## 2020-07-20 ENCOUNTER — Ambulatory Visit (INDEPENDENT_AMBULATORY_CARE_PROVIDER_SITE_OTHER): Payer: BC Managed Care – PPO | Admitting: Psychiatry

## 2020-07-20 ENCOUNTER — Other Ambulatory Visit: Payer: Self-pay

## 2020-07-20 ENCOUNTER — Encounter: Payer: Self-pay | Admitting: Psychiatry

## 2020-07-20 DIAGNOSIS — F331 Major depressive disorder, recurrent, moderate: Secondary | ICD-10-CM | POA: Diagnosis not present

## 2020-07-20 DIAGNOSIS — F411 Generalized anxiety disorder: Secondary | ICD-10-CM | POA: Diagnosis not present

## 2020-07-20 DIAGNOSIS — F4001 Agoraphobia with panic disorder: Secondary | ICD-10-CM | POA: Diagnosis not present

## 2020-07-20 DIAGNOSIS — F900 Attention-deficit hyperactivity disorder, predominantly inattentive type: Secondary | ICD-10-CM

## 2020-07-20 DIAGNOSIS — F5105 Insomnia due to other mental disorder: Secondary | ICD-10-CM

## 2020-07-20 MED ORDER — ACETYLCYSTEINE 600 MG PO CAPS: 1200.0000 mg | ORAL_CAPSULE | Freq: Every day | ORAL | 3 refills | Status: DC

## 2020-07-20 MED ORDER — TRAZODONE HCL 100 MG PO TABS: ORAL_TABLET | ORAL | 3 refills | Status: DC

## 2020-07-20 NOTE — Progress Notes (Signed)
Dhana Giovannetti [id number] [birth year] 60 y.o.  Subjective:   Patient ID:  Kristen Gamble is a 60 y.o. (DOB Sep 12, 1960) female.  Chief Complaint:  Chief Complaint  Patient presents with  . Follow-up    Anxiety Patient reports no confusion, decreased concentration, nervous/anxious behavior or suicidal ideas.    Depression        Associated symptoms include no decreased concentration and no suicidal ideas.  Past medical history includes anxiety.    last Melodie Poje presents to the office today for follow-up of depression.  Last seen  yearly..  No meds were changed.  She remained on gabapentin 300 nightly, Deplin 15, trazodone 200 nightly reduced to 100 mg HS.[name] doing really well without problems.  Exercise and Alanon helps. Son in recovery.  Is having more ADHD sx and ran out of NAC a couple of weeks ago.    Depression is better with med changes and therapy and support group.  Depression is managed.  Exercise helps anxiety. Couple panic with triggers but not severe.  Patient reports stable mood and denies depressed or irritable moods.  Patient reports episodic difficulty with anxiety.  Patient denies difficulty with sleep initiation or maintenance. Denies appetite disturbance.  Patient reports that energy and motivation have been good.  Patient denies any difficulty with concentration.  Patient denies any suicidal ideation.  Sleep good.  Exercise a lot with [place] and loves it.  No alcohol.  Seeing [name] in therapy monthly and it's helpful.  Also attending [facility].  Father was also an alcoholic.  Gabapentin for neuropathy.  Prior psychiatric medications include methylphenidate, mirtazapine 45 mg, trazodone, Deplin, N-acetylcysteine.  She last took stimulants December 2018.  Review of Systems:  Review of Systems  Neurological: Negative for tremors and weakness.       Neuropathy better.  Psychiatric/Behavioral: Positive for depression. Negative for agitation, behavioral problems,  confusion, decreased concentration, dysphoric mood, hallucinations, self-injury, sleep disturbance and suicidal ideas. The patient is not nervous/anxious and is not hyperactive.     Medications: I have reviewed the patient's current medications.  Current Outpatient Medications  Medication Sig Dispense Refill  . cholecalciferol (VITAMIN D3) 25 MCG (1000 UNIT) tablet Take 1,000 Units by mouth daily.    . fluticasone (FLONASE) 50 MCG/ACT nasal spray SHAKE LIQUID AND USE 2 SPRAYS IN EACH NOSTRIL DAILY 16 g 2  . gabapentin (NEURONTIN) 300 MG capsule Take 1 capsule (300 mg total) by mouth at bedtime. 90 capsule 4  . L-Methylfolate-Algae (DEPLIN 15) 15-90.314 MG CAPS TAKE 1 CAPSULE BY MOUTH EVERY DAY 90 capsule 3  . loratadine (CLARITIN) 10 MG tablet Take 10 mg by mouth daily.    . Multiple Vitamins-Minerals (MULTIVITAMIN WITH MINERALS) tablet Take 1 tablet daily by mouth.    . polyethylene glycol (MIRALAX / GLYCOLAX) packet Take 17 g by mouth daily as needed.    . thyroid (NP THYROID) 60 MG tablet Take 60 mg by mouth daily before breakfast.    . traZODone (DESYREL) 100 MG tablet TAKE 2 TABLETS(200 MG) BY MOUTH AT BEDTIME 180 tablet 3  . vitamin B-12 (CYANOCOBALAMIN) 100 MCG tablet Take 100 mcg by mouth daily.    . Zinc 22.5 MG TABS Take 1 tablet by mouth daily.     No current facility-administered medications for this visit.    Medication Side Effects: None  Allergies:  Allergies  Allergen Reactions  . Prednisone Palpitations  . Morphine Itching    REACTION: itching REACTION: itching    Past  Medical History:  Diagnosis Date  . ADHD (attention deficit hyperactivity disorder) 08/2011  . Arthritis   . Breast cancer ([place]) 10/2006   left  . Complication of anesthesia    nausea  . Constipation   . Family history of breast cancer   . Family history of melanoma   . Family history of rectal cancer   . Generalized anxiety disorder   . Hematuria   . History of kidney stones   . Major  depression    treated by Dr. Clovis Pu  . Nephrolithiasis    and stone distal R ureter on CT and R hydronephrosis  . Numbness    under l arm (resolved/intermittent)  . Osteopenia 05/2013   T score minus 1.7  . PONV (postoperative nausea and vomiting)   . TMJ (temporomandibular joint disorder)     Family History  Problem Relation Age of Onset  . Diabetes Father   . Alcohol abuse Father   . Depression Father   . Ulcers Father   . Stomach cancer Father 40       treated with partial gastrectomy. Died of other causes at 66.  . Diabetes Maternal Grandmother   . Hypertension Maternal Grandmother   . Heart attack Maternal Grandmother   . Heart disease Maternal Grandmother   . Hypothyroidism Maternal Grandmother   . Melanoma Maternal Grandmother 60       d.90  . Hypertension Maternal Grandfather   . Stroke Maternal Grandfather   . Asthma Maternal Grandfather   . Colon polyps Maternal Grandfather   . Heart attack Paternal Grandfather   . Heart disease Paternal Grandfather 33       died of MI  . Arthritis Mother   . Hypothyroidism Mother   . Heart disease Mother 72       pacemaker, afib  . COPD Mother   . Diabetes Brother   . Breast cancer Paternal Aunt 35       d.70s  . Rectal cancer Cousin 40       d.4s Daughter of unaffected maternal uncle.    Social History   Socioeconomic History  . Marital status: Married    Spouse name: Not on file  . Number of children: 4  . Years of education: Not on file  . Highest education level: Not on file  Occupational History  . 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 Smoking status: Never Smoker  . Smokeless tobacco: Never Used  Vaping Use  . Vaping Use: Never used  Substance and Sexual Activity  . Alcohol use: Yes    Alcohol/week: 1.0 standard drink    Types: 1 Glasses of wine per week    Comment: 1 glass/week (cut back related to son't issues)  . Drug use: No  . Sexual activity: Yes    Partners: Male    Birth  control/protection: Surgical    Comment: HYST  Other Topics Concern  . Not on file  Social History Narrative   Lives with her husband, 2 dogs, 2 cats.  She has boy/girl twins, and a daughter who graduated from [employer] and is in medical school at [employer]. Joslyn Hy is married, has 2 granddaughters.   Son (72 yo) alcoholic; was sent to rehab, and in sober living environment in [place] 03/2018. In college in [place], doing well.   Daughter is at [employer] (for film).   Helps care for her mother (who now lives nearby)   Social  Determinants of Health   Financial Resource Strain: Not on file  Food Insecurity: Not on file  Transportation Needs: Not on file  Physical Activity: Not on file  Stress: Not on file  Social Connections: Not on file  Intimate Partner Violence: Not on file    Past Medical History, Surgical history, Social history, and Family history were reviewed and updated as appropriate.   Please see review of systems for further details on the patient's review from today.   Enjoys to granddaughters 13 and 48 years old  Objective:   Physical Exam:  There were no vitals taken for this visit.  Physical Exam Constitutional:      General: She is not in acute distress.    Appearance: She is well-developed.  Musculoskeletal:        General: No deformity.  Neurological:     Mental Status: She is alert and oriented to person, place, and time.     Motor: No tremor.     Coordination: Coordination normal.     Gait: Gait normal.  Psychiatric:        Attention and Perception: Attention and perception normal.        Mood and Affect: Mood is not anxious or depressed. Affect is not labile, blunt, angry or inappropriate.        Speech: Speech normal.        Behavior: Behavior normal.        Thought Content: Thought content normal. Thought content does not include homicidal or suicidal ideation. Thought content does not include homicidal or suicidal plan.         Cognition and Memory: Cognition normal.        Judgment: Judgment normal.     Comments: Insight intact. No auditory or visual hallucinations. No delusions.      Lab Review:     Component Value Date/Time   NA 142 04/19/2020 1849   NA 142 05/18/2019 1213   NA 141 02/02/2015 0805   K 3.7 04/19/2020 1849   K 4.1 02/02/2015 0805   CL 106 04/19/2020 1849   CO2 26 04/19/2020 1849   CO2 26 02/02/2015 0805   GLUCOSE 118 (H) 04/19/2020 1849   GLUCOSE 80 02/02/2015 0805   BUN 14 04/19/2020 1849   BUN 18 05/18/2019 1213   BUN 20.3 02/02/2015 0805   CREATININE 0.58 04/19/2020 1849   CREATININE 0.7 02/02/2015 0805   CALCIUM 9.3 04/19/2020 1849   CALCIUM 10.6 (H) 02/02/2015 0805   PROT 7.4 04/19/2020 2042   PROT 6.7 05/18/2019 1213   PROT 6.9 02/02/2015 0805   ALBUMIN 4.6 04/19/2020 2042   ALBUMIN 4.5 05/18/2019 1213   ALBUMIN 4.0 02/02/2015 0805   AST 31 04/19/2020 2042   AST 26 02/02/2015 0805   ALT 50 (H) 04/19/2020 2042   ALT 28 02/02/2015 0805   ALKPHOS 69 04/19/2020 2042   ALKPHOS 83 02/02/2015 0805   BILITOT 0.6 04/19/2020 2042   BILITOT 0.2 05/18/2019 1213   BILITOT 0.38 02/02/2015 0805   GFRNONAA >60 04/19/2020 1849   GFRAA 118 05/18/2019 1213       Component Value Date/Time   WBC 5.2 04/19/2020 1849   RBC 4.16 04/19/2020 1849   HGB 13.5 04/19/2020 1849   HGB 13.4 05/18/2019 1213   HGB 13.8 02/02/2015 0805   HCT 38.8 04/19/2020 1849   HCT 39.1 05/18/2019 1213   HCT 41.4 02/02/2015 0805   PLT 204 04/19/2020 1849   PLT 211 05/18/2019 1213  MCV 93.3 04/19/2020 1849   MCV 92 05/18/2019 1213   MCV 96.1 02/02/2015 0805   MCH 32.5 04/19/2020 1849   MCHC 34.8 04/19/2020 1849   RDW 12.5 04/19/2020 1849   RDW 12.7 05/18/2019 1213   RDW 13.5 02/02/2015 0805   LYMPHSABS 1.8 05/18/2019 1213   LYMPHSABS 1.7 02/02/2015 0805   MONOABS 0.3 04/25/2016 1020   MONOABS 0.4 02/02/2015 0805   EOSABS 0.0 05/18/2019 1213   BASOSABS 0.0 05/18/2019 1213   BASOSABS 0.0  02/02/2015 0805    No results found for: POCLITH, LITHIUM   No results found for: PHENYTOIN, PHENOBARB, VALPROATE, CBMZ   .res Assessment: Plan:    Major depressive disorder, recurrent episode, moderate (HCC)  Generalized anxiety disorder  Panic disorder with agoraphobia  Insomnia due to mental condition  Attention deficit hyperactivity disorder (ADHD), predominantly inattentive type  Done and symptoms have been largely controlled through the combination of increasing the trazodone, therapy with [name] and [facility].  She is satisfied with her current treatment.  Restart NAC 1200 mg daily for ADD off label. Other options if needed.  She appears to be fairly medication sensitive but is tolerating these meds well.  The NAC is being used off label for ADD and cognitive reasons.  She does not want to take stimulants.  Follow up 1 year  Lynder Parents, MD, DFAPA  Please see After Visit Summary for patient specific instructions.  Future Appointments  Date Time Provider [place]  08/03/2020  9:45 AM Rita Ohara, MD [facility] [place]  09/07/2020  1:00 PM Magrinat, Virgie Dad, MD [facility] None    No orders of the defined types were placed in this encounter.     -------------------------------

## 2020-08-02 NOTE — Patient Instructions (Addendum)
  HEALTH MAINTENANCE RECOMMENDATIONS:  It is recommended that you get at least 30 minutes of aerobic exercise at least 5 days/week (for weight loss, you may need as much as 60-90 minutes). This can be any activity that gets your heart rate up. This can be divided in 10-15 minute intervals if needed, but try and build up your endurance at least once a week.  Weight bearing exercise is also recommended twice weekly.  Eat a healthy diet with lots of vegetables, fruits and fiber.  "Colorful" foods have a lot of vitamins (ie green vegetables, tomatoes, red peppers, etc).  Limit sweet tea, regular sodas and alcoholic beverages, all of which has a lot of calories and sugar.  Up to 1 alcoholic drink daily may be beneficial for women (unless trying to lose weight, watch sugars).  Drink a lot of water.  Calcium recommendations are 1200-1500 mg daily (1500 mg for postmenopausal women or women without ovaries), and vitamin D 1000 IU daily.  This should be obtained from diet and/or supplements (vitamins), and calcium should not be taken all at once, but in divided doses.  Monthly self breast exams and yearly mammograms for women over the age of 59 is recommended.  Sunscreen of at least SPF 30 should be used on all sun-exposed parts of the skin when outside between the hours of 10 am and 4 pm (not just when at beach or pool, but even with exercise, golf, tennis, and yard work!)  Use a sunscreen that says "broad spectrum" so it covers both UVA and UVB rays, and make sure to reapply every 1-2 hours.  Remember to change the batteries in your smoke detectors when changing your clock times in the spring and fall. Carbon monoxide detectors are recommended for your home.  Use your seat belt every time you are in a car, and please drive safely and not be distracted with cell phones and texting while driving.   I recommend getting the new shingles vaccine (Shingrix).  If you have commercial insurance, check with your  insurance to verify what your out of pocket cost may be (usually covered as preventative, but better to verify to avoid any surprises, as this vaccine is expensive), and then schedule a nurse visit at our office when convenient (based on the possible side effects as discussed).   This is a series of 2 injections, spaced 2 months apart.  It doesn't have to be exactly 2 months apart (but can't be under 2 months), if that isn't feasible for your schedule, but try and get them close to 2 months (and definitely within 6 months of each other, or else the efficacy of the vaccine drops off). Schedule a nurse visit for this.  Change your Flonase to 2 sprays once daily, to make your life easier. Let us know if your palpitations increase in frequency or duration, and if any associated dizziness, shortness of breath or chest pain.

## 2020-08-02 NOTE — Progress Notes (Signed)
Chief Complaint  Patient presents with   Annual Exam    Fasting annual exam no pelvic. Has a runny nose, thinks it is allergies-has not been taking claritin, only OTC flonase. Has been having some numbness in her feet, mostly after long hikes-L worse than R. Would like to do Shingrix, just not today-will schedule NV in the near future.    Kristen Gamble is a 60 y.o. female who presents for a complete physical.   She has the following concerns:  Numbness in feet L>R, if hiking.  It is painful.  It can be with different shoes.  Has been happening for a couple of years, but was more sporadic, and now it is every time.  Only starts after 3 miles.  She stops, takes shoes off. Never gets in her hands.  Nose has been running all winter.  She ran out of claritin, unsure if that contributes (was running less when taking it). Using Flonase 1 spray twice daily.  She was last seen in December with inflamed torus pallatini.  At that time she also reported recurrent heart fluttering. Usually when she has had this in the past, it was related to her thyroid.  She had checked with Dr. Chalmers Cater and reports that last check was reportedly normal, in October.  No longer having the mouth pain. She still has some palpitations, a few times/week, fast but short-lived (so unable to count her pulse).  Never occurs with exertion, usually it  is when she is just sitting, watching TV or going to bed. Not causing any kind of distress or worry. She previously saw Dr. Radford Pax.  Normal stress myoview in 12/2014.  She saw Dr. Tonita Cong in 04/2020 with LBP, rx'd meloxicam (only took a couple of days) and referred for PT. Dry needling really helped.  She is much better.  History of L breast cancer, under the care of Dr. [name].  She last saw him in 08/2019.  She had grade 1 invasive ductal carcinoma, strongly estrogen and progesterone receptor positive, HER2/neu negative.  She is s/p bilateral mastectomy, with subpectoral implant placement  July 2008.  Implants were removed due to rupture on 12/16/2017.  Dr. [name] started her on gabapentin 361m at bedtime for complaints of shooting pains in L chest, and also hot flashes.  The shooting pains are improved, but still having some discomfort at the left chest, sore spots, that were evaluated with x-rays and CT scan (no etiology found). She continues to have these, suspects muscular related to her activity. No significant change to her discomfort. She continues on gabapentin.  Feels tired in the morning.  She cut back on trazadone, still sleeping well, but still tired in the morning.  Hypothyroidism:  Currently under the care of Dr. BChalmers Cater  Previously had been going to [employer] Denies changes to her hair/skin/bowels weight.. No records received from Dr. BChalmers Cater She is complaining of fatigue.  Last check was in October, reportedly normal, no changes were made.  She is under the care of Dr. CClovis Pufor anxiety and ADD.  Last seen earlier in the month.  She previous got counseling with KAlvester Chou(no longer needs).  Hasn't been going to [facility], but has been doing her readings..  Using Trazadone for sleep, works well.    Osteopenia:  She had DEXA done 07/2018.  It showed osteopenia, with T-2.0 at left femoral neck. FRAX score was 24% for major fracture (1.8% for hip). Per his note, she was to discuss possible Prolia treatment  with PCP.  She had previously received IV bisphosphonate therapy (while on cancer treatment in 2008).  She had 3 doses, 6 months apart.  Went to ER after the first dose, had a lower dose the second and third doses. Switched oncologists to Dr. [name] (from [facility]), who took her off the bisphosphonate, and put her on Tamoxifen. She has been off tamoxifen since 2015.  She has known TMJ, with jaw pain and bone loss per pt.  It is better than in the past. No known surgeries planned.  Immunization History  Administered Date(s) Administered   Hepatitis A, Adult 11/12/2014    Influenza, Quadrivalent, Recombinant, Inj, Pf 04/25/2020   Influenza,inj,Quad PF,6+ Mos 04/10/2018, 04/25/2019   Influenza-Unspecified 06/04/2017, 04/16/2019, 04/25/2020   PFIZER(Purple Top)SARS-COV-2 Vaccination 09/30/2019, 10/21/2019, 06/14/2020   Tdap 10/22/2011   Typhoid Live 11/12/2014   Last pap smear: 2013, normal, by Dr. [name].  She reports she has had one since, with Dr. Helane Rima.  She is TAH/BSO 03/2007 with benign pathology. Due to see Dr. Helane Rima, scheduled for April. Last mammogram: n/a since mastectomy, has had u/s with lump evals Last colonoscopy: 04/24/2016, Dr. [name]. Diverticulosis noted Last DEXA: 07/2018, T-2.0 L fem neck Dentist: twice yearly Ophtho: once yearly Exercise: Has Peloton and treadmill, exercising 4-5 days/week, plus some walking.  She works with trainer 2x/week  Vitamin D-OH level last checked in 05/2019, normal at 27    PMH, [place], [place] and FH were reviewed and updated.  Outpatient Encounter Medications as of 08/03/2020  Medication Sig Note   Ascorbic Acid (VITAMIN C) 1000 MG tablet Take 1,000 mg by mouth daily.    cholecalciferol (VITAMIN D3) 25 MCG (1000 UNIT) tablet Take 1,000 Units by mouth daily.    fluticasone (FLONASE) 50 MCG/ACT nasal spray SHAKE LIQUID AND USE 2 SPRAYS IN EACH NOSTRIL DAILY    gabapentin (NEURONTIN) 300 MG capsule Take 1 capsule (300 mg total) by mouth at bedtime.    L-Methylfolate-Algae (DEPLIN 15) 15-90.314 MG CAPS TAKE 1 CAPSULE BY MOUTH EVERY DAY    Multiple Vitamins-Minerals (MULTIVITAMIN WITH MINERALS) tablet Take 1 tablet daily by mouth.    polyethylene glycol (MIRALAX / GLYCOLAX) packet Take 17 g by mouth daily as needed. 08/03/2020: Uses daily   thyroid (ARMOUR) 60 MG tablet Take 60 mg by mouth daily before breakfast. 06/10/2019: Does not take on Wed or Sunday   traZODone (DESYREL) 100 MG tablet TAKE 2 TABLETS(200 MG) BY MOUTH AT BEDTIME 08/03/2020: Taking 1 tablet at bedtime   vitamin B-12 (CYANOCOBALAMIN)  100 MCG tablet Take 100 mcg by mouth daily.    Zinc 22.5 MG TABS Take 1 tablet by mouth daily.    Acetylcysteine 600 MG CAPS Take 2 capsules (1,200 mg total) by mouth daily at 12 noon. (Patient not taking: Reported on 08/03/2020) 08/03/2020: Taking for ADHD; had been taking OTC, but no longer available.  Hasn't been able to fill rx due to insurance issue   loratadine (CLARITIN) 10 MG tablet Take 10 mg by mouth daily. (Patient not taking: Reported on 08/03/2020) 08/03/2020: Recently ran out   No facility-administered encounter medications on file as of 08/03/2020.   Allergies  Allergen Reactions   Prednisone Palpitations   Morphine Itching    REACTION: itching REACTION: itching     ROS:  The patient denies anorexia, fever, weight changes, headaches, vision changes, decreased hearing, ear pain, sore throat, breast concerns, dizziness, syncope, dyspnea on exertion, cough, swelling, nausea, vomiting, diarrhea, abdominal pain, melena, hematochezia, indigestion/heartburn, hematuria, dysuria, vaginal bleeding,  discharge, odor or itch, genital lesions, joint pains (just in fingers/hands/thumbs, known OA),  weakness, tremor, suspicious skin lesions (sees derm yearly, had BCC removed), abnormal bleeding/bruising, or enlarged lymph nodes.  Urge incontinence some mornings, sometimes urge with a "bad sneeze" Hot flashes at night, worse the last couple of months.  Not waking her up. Moods--stable with meds Fatigue per HPI. Flutter in chest/palpitations per HPI Chest pains, left sided, chronic (see HPI) Constipation is controlled by Miralax. Runny nose per HPI No longer getting numbness in L arm Numbness in L>R feet with walking/hiking 3+ miles, per HPI   PHYSICAL EXAM:  BP 100/60    Pulse 60    Ht 5' 3.5" (1.613 m)    Wt 122 lb 9.6 oz (55.6 kg)    BMI 21.38 kg/m   Wt Readings from Last 3 Encounters:  08/03/20 122 lb 9.6 oz (55.6 kg)  06/15/20 122 lb 12.8 oz (55.7 kg)  09/07/19 128 lb 4.8 oz  (58.2 kg)    General Appearance:    Alert, cooperative, no distress, appears stated age  Head:    Normocephalic, without obvious abnormality, atraumatic  Eyes:    PERRL, conjunctiva and sclera are clear. EOM's intact, fundi benign  Ears:    Normal TM's and external ear canals  Nose:   Not examined, wearing mask due to COVID-19 pandemic  Throat:   Not examined, wearing mask due to COVID-19 pandemic  Neck:   Supple, no lymphadenopathy;  thyroid:  no enlargement/ tenderness/nodules; no carotid bruit or JVD  Back:    Spine nontender, no curvature, ROM normal, no CVA  tenderness  Lungs:    Clear to auscultation bilaterally without wheezes, rales or  ronchi; respirations unlabored  Chest Wall:    Healing incision R upper chest (skin cancer excision). Absence of breasts.  WHSS (horizontal) bilaterally. No axillary lymphadenopathy. Some soft tissue discomfort at lateral chest wall (lateral to the incision), near R axilla. No bony tenderness   Heart:    Bradycardic, rate in the low 50's.  S1 and S2 normal, no murmur, rub or gallop, no ectopy  Breast Exam:   See chest wall above.  Abdomen:     Soft, non-tender, nondistended, normoactive bowel sounds,    no masses, no hepatosplenomegaly  Genitalia:    Deferred to GYN     Extremities:   No clubbing, cyanosis or edema  Pulses:   2+ and symmetric all extremities  Skin:   Skin color, texture, turgor normal, no rashes or lesions  Lymph nodes:   Cervical, supraclavicular, and axillary nodes normal  Neurologic:   CNII-XII intact, normal strength, sensation and gait; reflexes 2+ and symmetric throughout                                Psych:   Normal mood, affect, hygiene and grooming.   ASSESSMENT/PLAN:  Annual physical exam - Plan: POCT Urinalysis DIP (Proadvantage Device), Lipid panel, Comprehensive metabolic panel, CBC with Differential/Platelet, TSH, T4, Free, Vitamin B12  Osteopenia of neck of left femur - DEXA due. Cont Ca, D, weight-bearing  exercise  Hypothyroidism, unspecified type - recheck thyroid studies given increased fatigue - Plan: TSH, T4, Free  GAD (generalized anxiety disorder) - controlled, under the care of Dr. Clovis Pu  Personal history of breast cancer  Paresthesias - in feet, only after walking 3+ miles.  Suspect compressive neuropathy--encouraged to loosen laces. f/u podiatrist if persistent/worsening - Plan: TSH,  Vitamin B12  Fatigue, unspecified type - Plan: Comprehensive metabolic panel, CBC with Differential/Platelet, TSH, T4, Free, Vitamin B12   Recommended at least 30 minutes of aerobic activity at least 5 days/week, weight-bearing exercise at least 2x/week; proper sunscreen use reviewed; healthy diet, including goals of calcium and vitamin D intake and alcohol recommendations (less than or equal to 1 drink/day) reviewed; regular seatbelt use; changing batteries in smoke detectors.  Immunization recommendations discussed--continue yearly flu shots.  Shingrix recommended, risks/SE reviewed. To schedule NV when convenient. Colonoscopy recommendations reviewed--UTD. [facility] at [facility].  F/u 1 year, sooner prn

## 2020-08-03 ENCOUNTER — Other Ambulatory Visit: Payer: Self-pay

## 2020-08-03 ENCOUNTER — Encounter: Payer: Self-pay | Admitting: Family Medicine

## 2020-08-03 ENCOUNTER — Ambulatory Visit (INDEPENDENT_AMBULATORY_CARE_PROVIDER_SITE_OTHER): Payer: BC Managed Care – PPO | Admitting: Family Medicine

## 2020-08-03 VITALS — BP 100/60 | HR 60 | Ht 63.5 in | Wt 122.6 lb

## 2020-08-03 DIAGNOSIS — Z0000 Encounter for general adult medical examination without abnormal findings: Secondary | ICD-10-CM

## 2020-08-03 DIAGNOSIS — Z853 Personal history of malignant neoplasm of breast: Secondary | ICD-10-CM

## 2020-08-03 DIAGNOSIS — R202 Paresthesia of skin: Secondary | ICD-10-CM | POA: Diagnosis not present

## 2020-08-03 DIAGNOSIS — E039 Hypothyroidism, unspecified: Secondary | ICD-10-CM | POA: Diagnosis not present

## 2020-08-03 DIAGNOSIS — F411 Generalized anxiety disorder: Secondary | ICD-10-CM | POA: Diagnosis not present

## 2020-08-03 DIAGNOSIS — M85852 Other specified disorders of bone density and structure, left thigh: Secondary | ICD-10-CM

## 2020-08-03 DIAGNOSIS — R5383 Other fatigue: Secondary | ICD-10-CM | POA: Diagnosis not present

## 2020-08-03 LAB — POCT URINALYSIS DIP (PROADVANTAGE DEVICE)
Bilirubin, UA: NEGATIVE
Glucose, UA: NEGATIVE mg/dL
Ketones, POC UA: NEGATIVE mg/dL
Nitrite, UA: NEGATIVE
Specific Gravity, Urine: 1.03
Urobilinogen, Ur: NEGATIVE
pH, UA: 6 (ref 5.0–8.0)

## 2020-08-04 LAB — COMPREHENSIVE METABOLIC PANEL
ALT: 25 IU/L (ref 0–32)
AST: 39 IU/L (ref 0–40)
Albumin/Globulin Ratio: 1.9 (ref 1.2–2.2)
Albumin: 4.7 g/dL (ref 3.8–4.9)
Alkaline Phosphatase: 68 IU/L (ref 44–121)
BUN/Creatinine Ratio: 18 (ref 9–23)
BUN: 14 mg/dL (ref 6–24)
Bilirubin Total: 0.3 mg/dL (ref 0.0–1.2)
CO2: 23 mmol/L (ref 20–29)
Calcium: 9.5 mg/dL (ref 8.7–10.2)
Chloride: 103 mmol/L (ref 96–106)
Creatinine, Ser: 0.79 mg/dL (ref 0.57–1.00)
GFR calc Af Amer: 95 mL/min/{1.73_m2} (ref >59)
GFR calc non Af Amer: 83 mL/min/{1.73_m2} (ref >59)
Globulin, Total: 2.5 g/dL (ref 1.5–4.5)
Glucose: 93 mg/dL (ref 65–99)
Potassium: 4.6 mmol/L (ref 3.5–5.2)
Sodium: 140 mmol/L (ref 134–144)
Total Protein: 7.2 g/dL (ref 6.0–8.5)

## 2020-08-04 LAB — CBC WITH DIFFERENTIAL/PLATELET
Basophils Absolute: 0 10*3/uL (ref 0.0–0.2)
Basos: 0 %
EOS (ABSOLUTE): 0.1 10*3/uL (ref 0.0–0.4)
Eos: 2 %
Hematocrit: 41 % (ref 34.0–46.6)
Hemoglobin: 13.8 g/dL (ref 11.1–15.9)
Immature Grans (Abs): 0 10*3/uL (ref 0.0–0.1)
Immature Granulocytes: 0 %
Lymphocytes Absolute: 1.9 10*3/uL (ref 0.7–3.1)
Lymphs: 38 %
MCH: 31.8 pg (ref 26.6–33.0)
MCHC: 33.7 g/dL (ref 31.5–35.7)
MCV: 95 fL (ref 79–97)
Monocytes Absolute: 0.3 10*3/uL (ref 0.1–0.9)
Monocytes: 6 %
Neutrophils Absolute: 2.6 10*3/uL (ref 1.4–7.0)
Neutrophils: 54 %
Platelets: 227 10*3/uL (ref 150–450)
RBC: 4.34 x10E6/uL (ref 3.77–5.28)
RDW: 12.6 % (ref 11.7–15.4)
WBC: 5 10*3/uL (ref 3.4–10.8)

## 2020-08-04 LAB — T4, FREE: Free T4: 0.96 ng/dL (ref 0.82–1.77)

## 2020-08-04 LAB — VITAMIN B12: Vitamin B-12: 1198 pg/mL (ref 232–1245)

## 2020-08-04 LAB — LIPID PANEL
Chol/HDL Ratio: 2.6 ratio (ref 0.0–4.4)
Cholesterol, Total: 254 mg/dL — ABNORMAL HIGH (ref 100–199)
HDL: 99 mg/dL (ref >39)
LDL Chol Calc (NIH): 147 mg/dL — ABNORMAL HIGH (ref 0–99)
Triglycerides: 50 mg/dL (ref 0–149)
VLDL Cholesterol Cal: 8 mg/dL (ref 5–40)

## 2020-08-04 LAB — TSH: TSH: 1.8 u[IU]/mL (ref 0.450–4.500)

## 2020-08-17 ENCOUNTER — Encounter: Payer: Self-pay | Admitting: Family Medicine

## 2020-08-17 ENCOUNTER — Ambulatory Visit (INDEPENDENT_AMBULATORY_CARE_PROVIDER_SITE_OTHER): Payer: BC Managed Care – PPO | Admitting: Family Medicine

## 2020-08-17 ENCOUNTER — Other Ambulatory Visit: Payer: Self-pay

## 2020-08-17 VITALS — BP 110/68 | HR 52 | Temp 96.9°F | Wt 126.4 lb

## 2020-08-17 DIAGNOSIS — R399 Unspecified symptoms and signs involving the genitourinary system: Secondary | ICD-10-CM | POA: Diagnosis not present

## 2020-08-17 LAB — POCT URINALYSIS DIP (PROADVANTAGE DEVICE)
Bilirubin, UA: NEGATIVE
Glucose, UA: NEGATIVE mg/dL
Ketones, POC UA: NEGATIVE mg/dL
Nitrite, UA: NEGATIVE
Protein Ur, POC: NEGATIVE mg/dL
Specific Gravity, Urine: 1.025
Urobilinogen, Ur: 0.2
pH, UA: 6 (ref 5.0–8.0)

## 2020-08-17 NOTE — Patient Instructions (Signed)
Drink plenty of water. Consider drinking cranberry juice or taking cranberry extract tablets. Contact us tomorrow if your symptoms are worsening. We are sending your urine for culture--this may take up to 3-4 days for the final result (but we should know in 1-2 days if there is NO infection). If your symptoms are worsening tomorrow, we can go ahead and start treatment presumptively, and change the antibiotics only if needed based on the culture results.   Urinary Tract Infection, Adult A urinary tract infection (UTI) is an infection of any part of the urinary tract. The urinary tract includes:  The kidneys.  The ureters.  The bladder.  The urethra. These organs make, store, and get rid of pee (urine) in the body. What are the causes? This infection is caused by germs (bacteria) in your genital area. These germs grow and cause swelling (inflammation) of your urinary tract. What increases the risk? The following factors may make you more likely to develop this condition:  Using a small, thin tube (catheter) to drain pee.  Not being able to control when you pee or poop (incontinence).  Being female. If you are female, these things can increase the risk: ? Using these methods to prevent pregnancy:  A medicine that kills sperm (spermicide).  A device that blocks sperm (diaphragm). ? Having low levels of a female hormone (estrogen). ? Being pregnant. You are more likely to develop this condition if:  You have genes that add to your risk.  You are sexually active.  You take antibiotic medicines.  You have trouble peeing because of: ? A prostate that is bigger than normal, if you are female. ? A blockage in the part of your body that drains pee from the bladder. ? A kidney stone. ? A nerve condition that affects your bladder. ? Not getting enough to drink. ? Not peeing often enough.  You have other conditions, such as: ? Diabetes. ? A weak disease-fighting system (immune  system). ? Sickle cell disease. ? Gout. ? Injury of the spine. What are the signs or symptoms? Symptoms of this condition include:  Needing to pee right away.  Peeing small amounts often.  Pain or burning when peeing.  Blood in the pee.  Pee that smells bad or not like normal.  Trouble peeing.  Pee that is cloudy.  Fluid coming from the vagina, if you are female.  Pain in the belly or lower back. Other symptoms include:  Vomiting.  Not feeling hungry.  Feeling mixed up (confused). This may be the first symptom in older adults.  Being tired and grouchy (irritable).  A fever.  Watery poop (diarrhea). How is this treated?  Taking antibiotic medicine.  Taking other medicines.  Drinking enough water. In some cases, you may need to see a specialist. Follow these instructions at home: Medicines  Take over-the-counter and prescription medicines only as told by your doctor.  If you were prescribed an antibiotic medicine, take it as told by your doctor. Do not stop taking it even if you start to feel better. General instructions  Make sure you: ? Pee until your bladder is empty. ? Do not hold pee for a long time. ? Empty your bladder after sex. ? Wipe from front to back after peeing or pooping if you are a female. Use each tissue one time when you wipe.  Drink enough fluid to keep your pee pale yellow.  Keep all follow-up visits.   Contact a doctor if:  You do not  get better after 1-2 days.  Your symptoms go away and then come back. Get help right away if:  You have very bad back pain.  You have very bad pain in your lower belly.  You have a fever.  You have chills.  You feeling like you will vomit or you vomit. Summary  A urinary tract infection (UTI) is an infection of any part of the urinary tract.  This condition is caused by germs in your genital area.  There are many risk factors for a UTI.  Treatment includes antibiotic  medicines.  Drink enough fluid to keep your pee pale yellow. This information is not intended to replace advice given to you by your health care provider. Make sure you discuss any questions you have with your health care provider. Document Revised: 02/12/2020 Document Reviewed: 02/12/2020 Elsevier Patient Education  [employer].

## 2020-08-17 NOTE — Progress Notes (Signed)
Chief Complaint  Patient presents with  . possible uti    She helped her daughter move to [place] this weekend. She woke up this morning with a heavy sensation across the lower abdomen, and her back. She had some burning, and urgency, frequency with urination. She feels a little better since drinking more water throughout the day. She denies hematuria.  H/o UTI's, not recent. She has had kidney stones  Symptoms started this am, a little better since drinking more.  PMH, PSH, SH reviewed  Outpatient Encounter Medications as of 08/17/2020  Medication Sig Note  . Ascorbic Acid (VITAMIN C) 1000 MG tablet Take 1,000 mg by mouth daily.   . cholecalciferol (VITAMIN D3) 25 MCG (1000 UNIT) tablet Take 1,000 Units by mouth daily.   . fluticasone (FLONASE) 50 MCG/ACT nasal spray SHAKE LIQUID AND USE 2 SPRAYS IN EACH NOSTRIL DAILY   . gabapentin (NEURONTIN) 300 MG capsule Take 1 capsule (300 mg total) by mouth at bedtime.   Marland Kitchen L-Methylfolate-Algae (DEPLIN 15) 15-90.314 MG CAPS TAKE 1 CAPSULE BY MOUTH EVERY DAY   . Multiple Vitamins-Minerals (MULTIVITAMIN WITH MINERALS) tablet Take 1 tablet daily by mouth.   . polyethylene glycol (MIRALAX / GLYCOLAX) packet Take 17 g by mouth daily as needed. 08/03/2020: Uses daily  . thyroid (ARMOUR) 60 MG tablet Take 60 mg by mouth daily before breakfast. 06/10/2019: Does not take on Wed or Sunday  . traZODone (DESYREL) 100 MG tablet TAKE 2 TABLETS(200 MG) BY MOUTH AT BEDTIME 08/03/2020: Taking 1 tablet at bedtime  . vitamin B-12 (CYANOCOBALAMIN) 100 MCG tablet Take 100 mcg by mouth daily.   . Zinc 22.5 MG TABS Take 1 tablet by mouth daily.   . Acetylcysteine 600 MG CAPS Take 2 capsules (1,200 mg total) by mouth daily at 12 noon. (Patient not taking: No sig reported) 08/03/2020: Taking for ADHD; had been taking OTC, but no longer available.  Hasn't been able to fill rx due to insurance issue  . loratadine (CLARITIN) 10 MG tablet Take 10 mg by mouth daily. (Patient  not taking: No sig reported) 08/03/2020: Recently ran out   No facility-administered encounter medications on file as of 08/17/2020.   Allergies  Allergen Reactions  . Prednisone Palpitations  . Morphine Itching    REACTION: itching REACTION: itching    ROS: no fever, chills, nausea, vomiting, diarrhea. No significant vaginal discharge. Urinary complaints per HPI   PHYSICAL EXAM:  BP 110/68   Pulse (!) 52   Temp (!) 96.9 F (36.1 C)   Wt 126 lb 6.4 oz (57.3 kg)   SpO2 99%   BMI 22.04 kg/m   Well-appearing, pleasant female, in no distress HEENT: conjunctiva and sclera are clear, wearing mask Neck: no lymphadenopathy or mass Heart: bradycardic, no ectopy or murmur Lungs: clear bilaterally Back: no CVA tenderness, no spinal or muscular tenderness Abdomen: soft, nontender, no mass.  No suprapubic tenderness Extremities: no edema Neuro: alert and oriented, normal gait  Urine dip: SG 1.025, trace blood, trace leuks, nitrite negative   ASSESSMENT/PLAN:  UTI symptoms - Mild symptoms x 1d, improved with increased water intake. Culture sent; rec cranberry juice/extract. Start ABX if sx persist/worsen (or +culture) - Plan: POCT Urinalysis DIP (Proadvantage Device), Urine Culture   Drink plenty of water. Consider drinking cranberry juice or taking cranberry extract tablets. Contact us tomorrow if your symptoms are worsening. We are sending your urine for culture--this may take up to 3-4 days for the final result (but we should know in  1-2 days if there is NO infection). If your symptoms are worsening tomorrow, we can go ahead and start treatment presumptively, and change the antibiotics only if needed based on the culture results.

## 2020-08-19 LAB — URINE CULTURE

## 2020-09-06 NOTE — Progress Notes (Signed)
[place]  [phone] [phone]     ID: Kristen Gamble   DOB: 1960/10/17  MR#: [id number]  GYB#:[id number]  Patient Care Team: Rita Ohara, MD as PCP - General (Family Medicine) Sultana Tierney, Virgie Dad, MD (Hematology and Oncology) Dian Queen, MD as Consulting Physician (Obstetrics and Gynecology) Leta Baptist, MD as Consulting Physician (Otolaryngology) Sueanne Margarita, MD as Consulting Physician (Cardiology) Ronald Lobo, MD as Consulting Physician (Gastroenterology) Cottle, Billey Co., MD as Attending Physician (Psychiatry) OTHER MD:    CHIEF COMPLAINT: Estrogen receptor positive breast cancer (s/p bilateral mastectomies)  CURRENT TREATMENT: Observation    INTERVAL HISTORY: Kristen Gamble returns today for follow-up of her estrogen receptor positive breast cancer. She continues under observation.  At her last visit, she complained of left chest wall pain. She underwent chest CT on 09/17/2019 showing no findings to explain chest wall pain.   REVIEW OF SYSTEMS: Kristen Gamble is doing generally well. She feels a little bit more tired and thinks she thinks she should. She actually exercises several times a week including some weightbearing and some gym exercises in the [place]. There is extra stress in the family because both her mother and her husband and his parents are declining. The kids are in college and doing well otherwise. She had a basal cell removed from her anterior chest wall by Dr. Martinique recently. A detailed review of systems is otherwise unremarkable.   COVID 19 VACCINATION STATUS: fully vaccinated [employer]), with booster 05/2020   HISTORY OF PRESENT ILLNESS: From the original intake note:   I do not have records before 10/2006, but in that month the patient underwent bilateral mastectomy for left sided breast cancer, which proved to be an invasive ductal carcinoma, grade 1, measuring 6 mm, in the setting of DCIS (grade 2).  One of the margins was  positive focally.  The tumor was ER and PR more than 95% positive, Hercept test negative (at 0/1), and she had an Oncotype recurrence score of 13 predicting a distant risk of recurrence of 8% if she took tamoxifen for 5 years.  In 11/2006 the patient underwent a second excision to clear the margins.  There was a 5 mm residual focus of tumor.  Left axillary lymph node sampling showed a negative sentinel lymph node.  Margins were negative, although close (margins for DCIS were less than 1 mm, and for the invasive tumor less than 2 mm, both deep).  She had lateral subpectoral implants placed, and has been on Femara with fairly significant side effects that are a major source of concern to the patient.  In addition, the patient underwent TAH/BSO 03/2007 with benign pathology.  She has also had multiple other breast biopsies (12/2006, two right sided benign breast lesions; 08/2007, removal of a dermatofibroma; 12/2007, removal of a 2 mm left breast mass which was benign, and 04/2008 a left breast biopsy which proved to be scar).  The patient has had extensive studies including CT of chest, abdomen, and pelvis 12/2007, which was negative.  She had a bone scan in 05/2007 which was negative.   Her subsequent history is as detailed below.   PAST MEDICAL HISTORY: Past Medical History:  Diagnosis Date  . ADHD (attention deficit hyperactivity disorder) 08/2011  . Arthritis   . BCC (basal cell carcinoma of skin) 05/2020   chest, Dr. Martinique  . Breast cancer ([place]) 10/2006   left  . Complication of anesthesia    nausea  . Constipation   . Family history  of breast cancer   . Family history of melanoma   . Family history of rectal cancer   . Generalized anxiety disorder   . Hematuria   . History of kidney stones   . Major depression    treated by Dr. Clovis Pu  . Nephrolithiasis    and stone distal R ureter on CT and R hydronephrosis  . Numbness    under l arm (resolved/intermittent)  . Osteopenia 05/2013    T score minus 1.7  . PONV (postoperative nausea and vomiting)   . TMJ (temporomandibular joint disorder)     PAST SURGICAL HISTORY: Past Surgical History:  Procedure Laterality Date  . ABDOMINAL HYSTERECTOMY    . AUGMENTATION MAMMAPLASTY  1999  . BREAST BIOPSY     x4 in 2009-2012, all benign  . BREAST SURGERY  2008   Lumpectomy-Bilateral mastectomy-implants  . COLPOSCOPY    . CYSTOSCOPY    . CYSTOSCOPY WITH RETROGRADE PYELOGRAM, URETEROSCOPY AND STENT PLACEMENT Left 11/05/2014   Procedure: CYSTOSCOPY WITH RETROGRADE PYELOGRAM, URETEROSCOPY AND STENT PLACEMENT;  Surgeon: Franchot Gallo, MD;  [facility]: [facility];  Service: Urology;  Laterality: Left;  . CYSTOSCOPY/RETROGRADE/URETEROSCOPY/STONE EXTRACTION WITH BASKET Right 11/13/2013   Procedure: CYSTOSCOPY, RIGHT RETROGRADE PYLOGRAM WITH INTERPRETATION, RIGHT URETEROSCOPY WITH DILATION OF URETERAL STRICTURE WITH BASKET EXTRACTION OF STONE;  Surgeon: Ailene Rud, MD;  Location: [facility];  Service: Urology;  Laterality: Right;  . FOOT SURGERY  2014   Right big toe  . GYNECOLOGIC CRYOSURGERY  age 62  . LAPAROSCOPIC TOTAL HYSTERECTOMY    . MASTECTOMY Bilateral 01-2007  . PARATHYROIDECTOMY N/A 07/07/2015   Procedure: PARATHYROIDECTOMY;  Surgeon: Armandina Gemma, MD;  Location: [place];  Service: General;  Laterality: N/A;  . PELVIC LAPAROSCOPY     LAP HYST BSO  . STONE EXTRACTION WITH BASKET Left 11/05/2014   Procedure: STONE EXTRACTION WITH BASKET;  Surgeon: Franchot Gallo, MD;  Location[facility];  Service: Urology;  Laterality: Left;  . TONSILLECTOMY  1971  . TOTAL ABDOMINAL HYSTERECTOMY W/ BILATERAL SALPINGOOPHORECTOMY  9/08    FAMILY HISTORY: Family History  Problem Relation Age of Onset  . Diabetes Father   . Alcohol abuse Father   . Depression Father   . Ulcers Father   . Stomach cancer Father 48       treated with partial gastrectomy. Died of other causes at 20.  . Diabetes Maternal  Grandmother   . Hypertension Maternal Grandmother   . Heart attack Maternal Grandmother   . Heart disease Maternal Grandmother   . Hypothyroidism Maternal Grandmother   . Melanoma Maternal Grandmother 60       d.90  . Hypertension Maternal Grandfather   . Stroke Maternal Grandfather   . Asthma Maternal Grandfather   . Colon polyps Maternal Grandfather   . Heart attack Paternal Grandfather   . Heart disease Paternal Grandfather 3       died of MI  . Arthritis Mother   . Hypothyroidism Mother   . Heart disease Mother 40       pacemaker, afib  . COPD Mother   . Diabetes Brother   . Breast cancer Paternal Aunt 17       d.70s  . Rectal cancer Cousin 40       d.78s Daughter of unaffected maternal uncle.  The patient's father died at the age of 26 in the setting of alcoholism.  The patient's mother is alive. There is a brother in good health.  There  is no other history of breast cancer or ovarian cancer in the family.   GYNECOLOGIC HISTORY: She is GX, P2.  First pregnancy to term age 5.  She has had significant problems with hot flashes and vaginal dryness since she had her TAH/BSO in September of 2008.   SOCIAL HISTORY: (updated February 2021). Kristen Gamble is very active in our [employer]. 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  They work from home.  Their daughter [name], is a Museum/gallery exhibitions officer in medical school at [employer]. Their second child, Lorna Dibble, died at the age of 2.5 from a viral myocarditis.  Kristen Gamble has a son, [name], from a prior marriage. Kona and Kristen Gamble have also adopted a pair of twins: The girl is currently at the [employer] and the boy is going to college in [place].. The patient attends [employer] DIRECTIVES: in place   HEALTH MAINTENANCE: Social History   Tobacco Use  . Smoking status: Never Smoker  . Smokeless tobacco: Never Used  Vaping Use  . Vaping Use: Never used   Substance Use Topics  . Alcohol use: Yes    Alcohol/week: 1.0 standard drink    Types: 1 Glasses of wine per week    Comment: 1 glass/week (cut back related to son't issues)  . Drug use: No     Colonoscopy: 2015, f/u recommended in 3 years  PAP: s/p TAH/BSO  Bone density: 07/2018, -2.0  Lipid panel: 2013  Allergies  Allergen Reactions  . Prednisone Palpitations  . Morphine Itching    REACTION: itching REACTION: itching    Current Outpatient Medications  Medication Sig Dispense Refill  . Acetylcysteine 600 MG CAPS Take 2 capsules (1,200 mg total) by mouth daily at 12 noon. (Patient not taking: No sig reported) 180 capsule 3  . Ascorbic Acid (VITAMIN C) 1000 MG tablet Take 1,000 mg by mouth daily.    . cholecalciferol (VITAMIN D3) 25 MCG (1000 UNIT) tablet Take 1,000 Units by mouth daily.    . fluticasone (FLONASE) 50 MCG/ACT nasal spray SHAKE LIQUID AND USE 2 SPRAYS IN EACH NOSTRIL DAILY 16 g 2  . gabapentin (NEURONTIN) 300 MG capsule Take 1 capsule (300 mg total) by mouth at bedtime. 90 capsule 4  . L-Methylfolate-Algae (DEPLIN 15) 15-90.314 MG CAPS TAKE 1 CAPSULE BY MOUTH EVERY DAY 90 capsule 3  . loratadine (CLARITIN) 10 MG tablet Take 10 mg by mouth daily. (Patient not taking: No sig reported)    . Multiple Vitamins-Minerals (MULTIVITAMIN WITH MINERALS) tablet Take 1 tablet daily by mouth.    . polyethylene glycol (MIRALAX / GLYCOLAX) packet Take 17 g by mouth daily as needed.    . thyroid (ARMOUR) 60 MG tablet Take 60 mg by mouth daily before breakfast.    . traZODone (DESYREL) 100 MG tablet TAKE 2 TABLETS(200 MG) BY MOUTH AT BEDTIME 180 tablet 3  . vitamin B-12 (CYANOCOBALAMIN) 100 MCG tablet Take 100 mcg by mouth daily.    . Zinc 22.5 MG TABS Take 1 tablet by mouth daily.     No current facility-administered medications for this visit.    OBJECTIVE: Middle-aged white woman who appears younger than stated age 89:   09/07/20 1301  BP: (!) 91/53  Pulse: 81  Resp:  18  Temp: (!) 97.5 F (36.4 C)  SpO2: 100%     Body mass index is 21.71 kg/m.    ECOG FS: 1  Sclerae unicteric, EOMs intact Wearing a mask No cervical or supraclavicular adenopathy Lungs no rales or rhonchi Heart regular rate and rhythm Abd soft, nontender, positive bowel sounds MSK no focal spinal tenderness, no upper extremity lymphedema Neuro: nonfocal, well oriented, appropriate affect Breasts: Status post bilateral mastectomies. There is no evidence of local recurrence. Both axillae are benign.   LAB RESULTS: Lab Results  Component Value Date   WBC 5.0 08/03/2020   NEUTROABS 2.6 08/03/2020   HGB 13.8 08/03/2020   HCT 41.0 08/03/2020   MCV 95 08/03/2020   PLT 227 08/03/2020      Chemistry      Component Value Date/Time   NA 140 08/03/2020 1131   NA 141 02/02/2015 0805   K 4.6 08/03/2020 1131   K 4.1 02/02/2015 0805   CL 103 08/03/2020 1131   CO2 23 08/03/2020 1131   CO2 26 02/02/2015 0805   BUN 14 08/03/2020 1131   BUN 20.3 02/02/2015 0805   CREATININE 0.79 08/03/2020 1131   CREATININE 0.7 02/02/2015 0805      Component Value Date/Time   CALCIUM 9.5 08/03/2020 1131   CALCIUM 10.6 (H) 02/02/2015 0805   ALKPHOS 68 08/03/2020 1131   ALKPHOS 83 02/02/2015 0805   AST 39 08/03/2020 1131   AST 26 02/02/2015 0805   ALT 25 08/03/2020 1131   ALT 28 02/02/2015 0805   BILITOT 0.3 08/03/2020 1131   BILITOT 0.38 02/02/2015 0805       Lab Results  Component Value Date   LABCA2 27 02/04/2012    No components found for: [id number]  No results for input(s): INR in the last 168 hours.  Urinalysis    Component Value Date/Time   COLORURINE COLORLESS (A) 04/19/2020 1821   APPEARANCEUR CLEAR 04/19/2020 1821   LABSPEC 1.025 08/17/2020 1556   PHURINE 6.0 04/19/2020 [place] 04/19/2020 1821   HGBUR NEGATIVE 04/19/2020 1821   BILIRUBINUR negative 08/17/2020 1556   BILIRUBINUR neg 01/03/2015 1336   KETONESUR negative 08/17/2020 [place] 04/19/2020 1821   PROTEINUR negative 08/17/2020 1556   PROTEINUR NEGATIVE 04/19/2020 1821   UROBILINOGEN negative 01/03/2015 1336   UROBILINOGEN 0.2 10/28/2013 1010   NITRITE Negative 08/17/2020 1556   NITRITE NEGATIVE 04/19/2020 1821   LEUKOCYTESUR Trace (A) 08/17/2020 1556   LEUKOCYTESUR NEGATIVE 04/19/2020 1821    STUDIES: No results found.   ASSESSMENT: 60 y.o. [place] woman status post bilateral mastectomies with subpectoral implant placement July 2008 for a T1c N0, stage IA invasive ductal carcinoma, grade 1, strongly estrogen and progesterone receptor positive, HER2/neu negative,   (a) Oncotype DX recurrence score of 13, predicted an 8% risk of recurrence outside the breast if her only systemic therapy was 5 years of tamoxifen.    (1) She started antiestrogens September 2008, initially tamoxifen, then Femara with Lupron, switched to tamoxifen again November 2009, stopped tamoxifen at her own discretion December 2011, resumed July 2012,  discontinued July 2015 , when the patient went off Vagifem suppositories  (2) osteopenia:  (a) scan 06/05/2013 showed a T score of -1.7  (b) DEXA scan 08/15/2018 shows a T score of -2.0  (3) status post bilateral silicone implant reconstruction  (a) implants removed secondary to leakage, not replaced   PLAN: Abeer is now 14 years out from definitive surgery for her breast cancer with no evidence of disease recurrence. This is very favorable.  I reassured her that if she took more vacations or cut back a little  bit on her work and also if her and and these parents problems became more manageable as she would feel a lot less stress and she would not feel the fatigue she is feeling. Despite that she is extremely active and I commended her exercise program.  At this point I feel comfortable releasing her to her primary care physician's care. All she will need in terms of breast cancer follow-up is a yearly chest wall exam  I will be  glad to see Joscelyne again at any point in the future if and when the need arises but as of now are making no further routine appointments for her here  Total encounter time 20 minutes.*  Keishawn Darsey, Virgie Dad, MD  09/07/20 1:46 PM Medical Oncology and Hematology [facility] [place], [place] [address] Tel. [phone]    Fax. [phone]   I, Wilburn Mylar, am acting as scribe for Dr. Virgie Dad. Damarien Nyman.  I, Lurline Del MD, have reviewed the above documentation for accuracy and completeness, and I agree with the above.   *Total Encounter Time as defined by the Centers for Medicare and Medicaid Services includes, in addition to the face-to-face time of a patient visit (documented in the note above) non-face-to-face time: obtaining and reviewing outside history, ordering and reviewing medications, tests or procedures, care coordination (communications with other health care professionals or caregivers) and documentation in the medical record.

## 2020-09-07 ENCOUNTER — Inpatient Hospital Stay: Payer: BC Managed Care – PPO | Attending: Oncology | Admitting: Oncology

## 2020-09-07 ENCOUNTER — Other Ambulatory Visit: Payer: Self-pay

## 2020-09-07 VITALS — BP 91/53 | HR 81 | Temp 97.5°F | Resp 18 | Ht 63.5 in | Wt 124.5 lb

## 2020-09-07 DIAGNOSIS — Z170 Estrogen receptor positive status [ER+]: Secondary | ICD-10-CM

## 2020-09-07 DIAGNOSIS — Z9013 Acquired absence of bilateral breasts and nipples: Secondary | ICD-10-CM | POA: Diagnosis not present

## 2020-09-07 DIAGNOSIS — F4001 Agoraphobia with panic disorder: Secondary | ICD-10-CM | POA: Diagnosis not present

## 2020-09-07 DIAGNOSIS — Z90710 Acquired absence of both cervix and uterus: Secondary | ICD-10-CM | POA: Diagnosis not present

## 2020-09-07 DIAGNOSIS — Z9079 Acquired absence of other genital organ(s): Secondary | ICD-10-CM | POA: Diagnosis not present

## 2020-09-07 DIAGNOSIS — F411 Generalized anxiety disorder: Secondary | ICD-10-CM

## 2020-09-07 DIAGNOSIS — C50812 Malignant neoplasm of overlapping sites of left female breast: Secondary | ICD-10-CM | POA: Diagnosis not present

## 2020-09-07 DIAGNOSIS — Z833 Family history of diabetes mellitus: Secondary | ICD-10-CM | POA: Insufficient documentation

## 2020-09-07 DIAGNOSIS — Z8249 Family history of ischemic heart disease and other diseases of the circulatory system: Secondary | ICD-10-CM | POA: Diagnosis not present

## 2020-09-07 DIAGNOSIS — M8580 Other specified disorders of bone density and structure, unspecified site: Secondary | ICD-10-CM | POA: Diagnosis not present

## 2020-09-07 DIAGNOSIS — Z803 Family history of malignant neoplasm of breast: Secondary | ICD-10-CM | POA: Diagnosis not present

## 2020-09-07 DIAGNOSIS — Z800 Family history of malignant neoplasm of digestive organs: Secondary | ICD-10-CM | POA: Insufficient documentation

## 2020-09-07 DIAGNOSIS — F331 Major depressive disorder, recurrent, moderate: Secondary | ICD-10-CM | POA: Diagnosis not present

## 2020-09-07 DIAGNOSIS — F5105 Insomnia due to other mental disorder: Secondary | ICD-10-CM

## 2020-09-07 DIAGNOSIS — Z853 Personal history of malignant neoplasm of breast: Secondary | ICD-10-CM | POA: Insufficient documentation

## 2020-09-07 DIAGNOSIS — Z90722 Acquired absence of ovaries, bilateral: Secondary | ICD-10-CM | POA: Insufficient documentation

## 2020-09-07 DIAGNOSIS — Z8261 Family history of arthritis: Secondary | ICD-10-CM | POA: Diagnosis not present

## 2020-09-07 DIAGNOSIS — Z8349 Family history of other endocrine, nutritional and metabolic diseases: Secondary | ICD-10-CM | POA: Insufficient documentation

## 2020-10-26 DIAGNOSIS — Z01419 Encounter for gynecological examination (general) (routine) without abnormal findings: Secondary | ICD-10-CM | POA: Diagnosis not present

## 2020-11-12 ENCOUNTER — Other Ambulatory Visit: Payer: Self-pay | Admitting: Oncology

## 2021-01-05 DIAGNOSIS — Z780 Asymptomatic menopausal state: Secondary | ICD-10-CM | POA: Diagnosis not present

## 2021-01-05 DIAGNOSIS — M8589 Other specified disorders of bone density and structure, multiple sites: Secondary | ICD-10-CM | POA: Diagnosis not present

## 2021-01-13 DIAGNOSIS — U071 COVID-19: Secondary | ICD-10-CM

## 2021-01-13 HISTORY — DX: COVID-19: U07.1

## 2021-01-20 IMAGING — DX DG RIBS 2V*L*
3 series · 3 of 3 positions shown · non-contrast
Comparison: 04/25/2016.

CLINICAL DATA: Breast cancer.  Implant removal.  Pain.

EXAM:
LEFT RIBS - 2 VIEW

[rib pa]
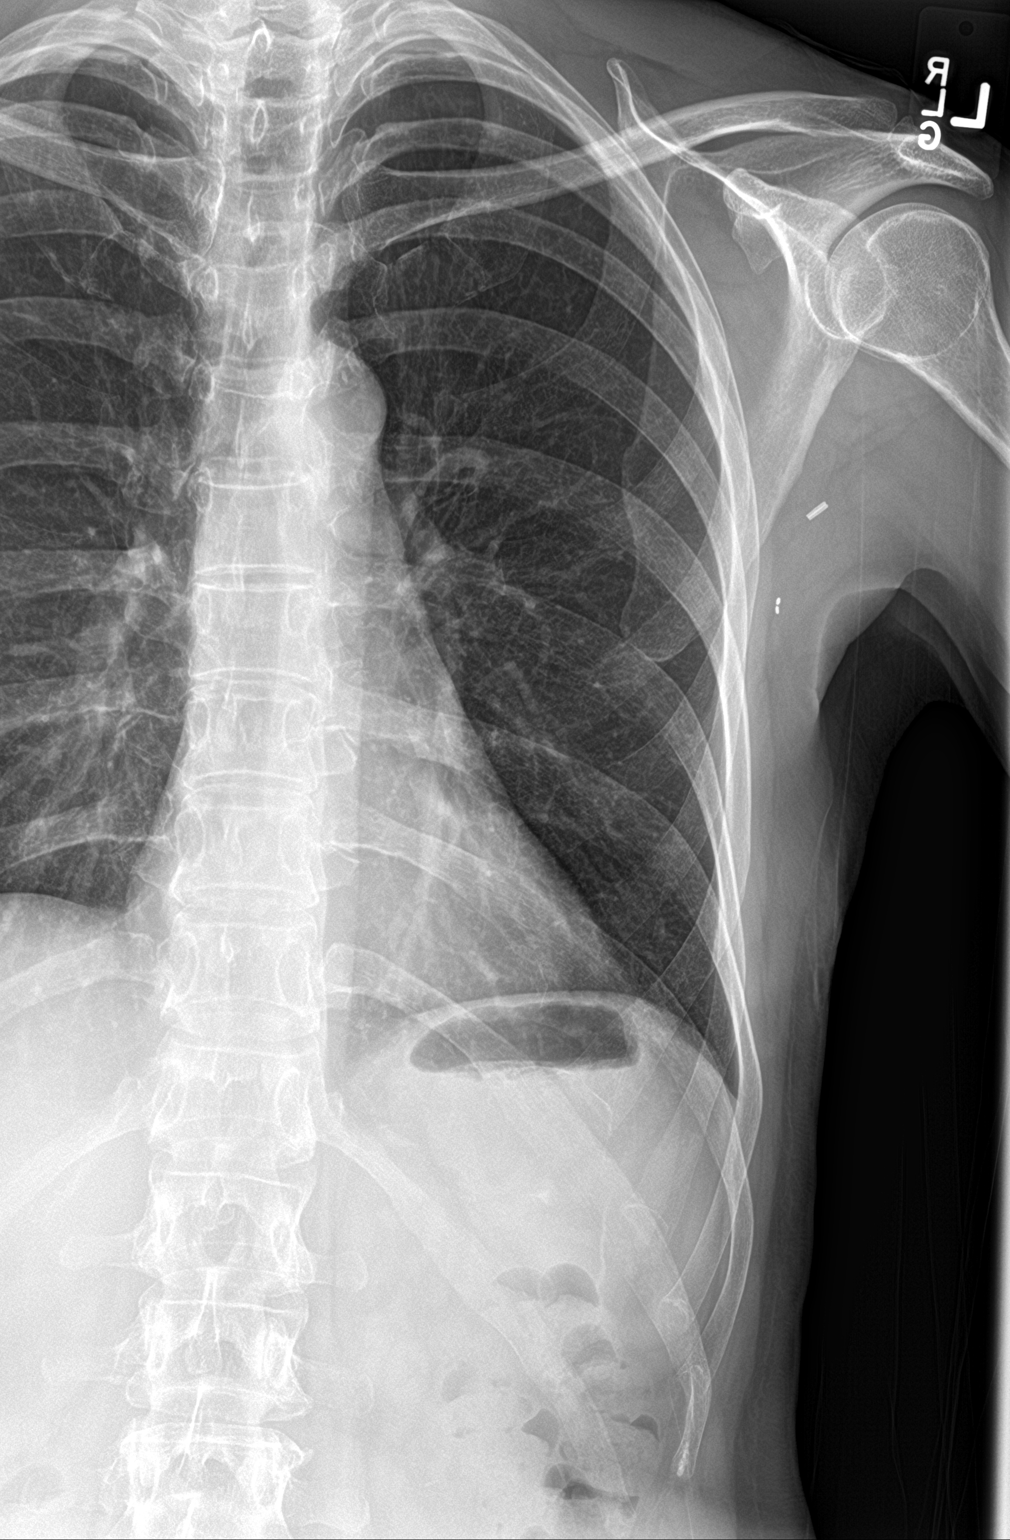

[rib pa obl (1 of 2)]
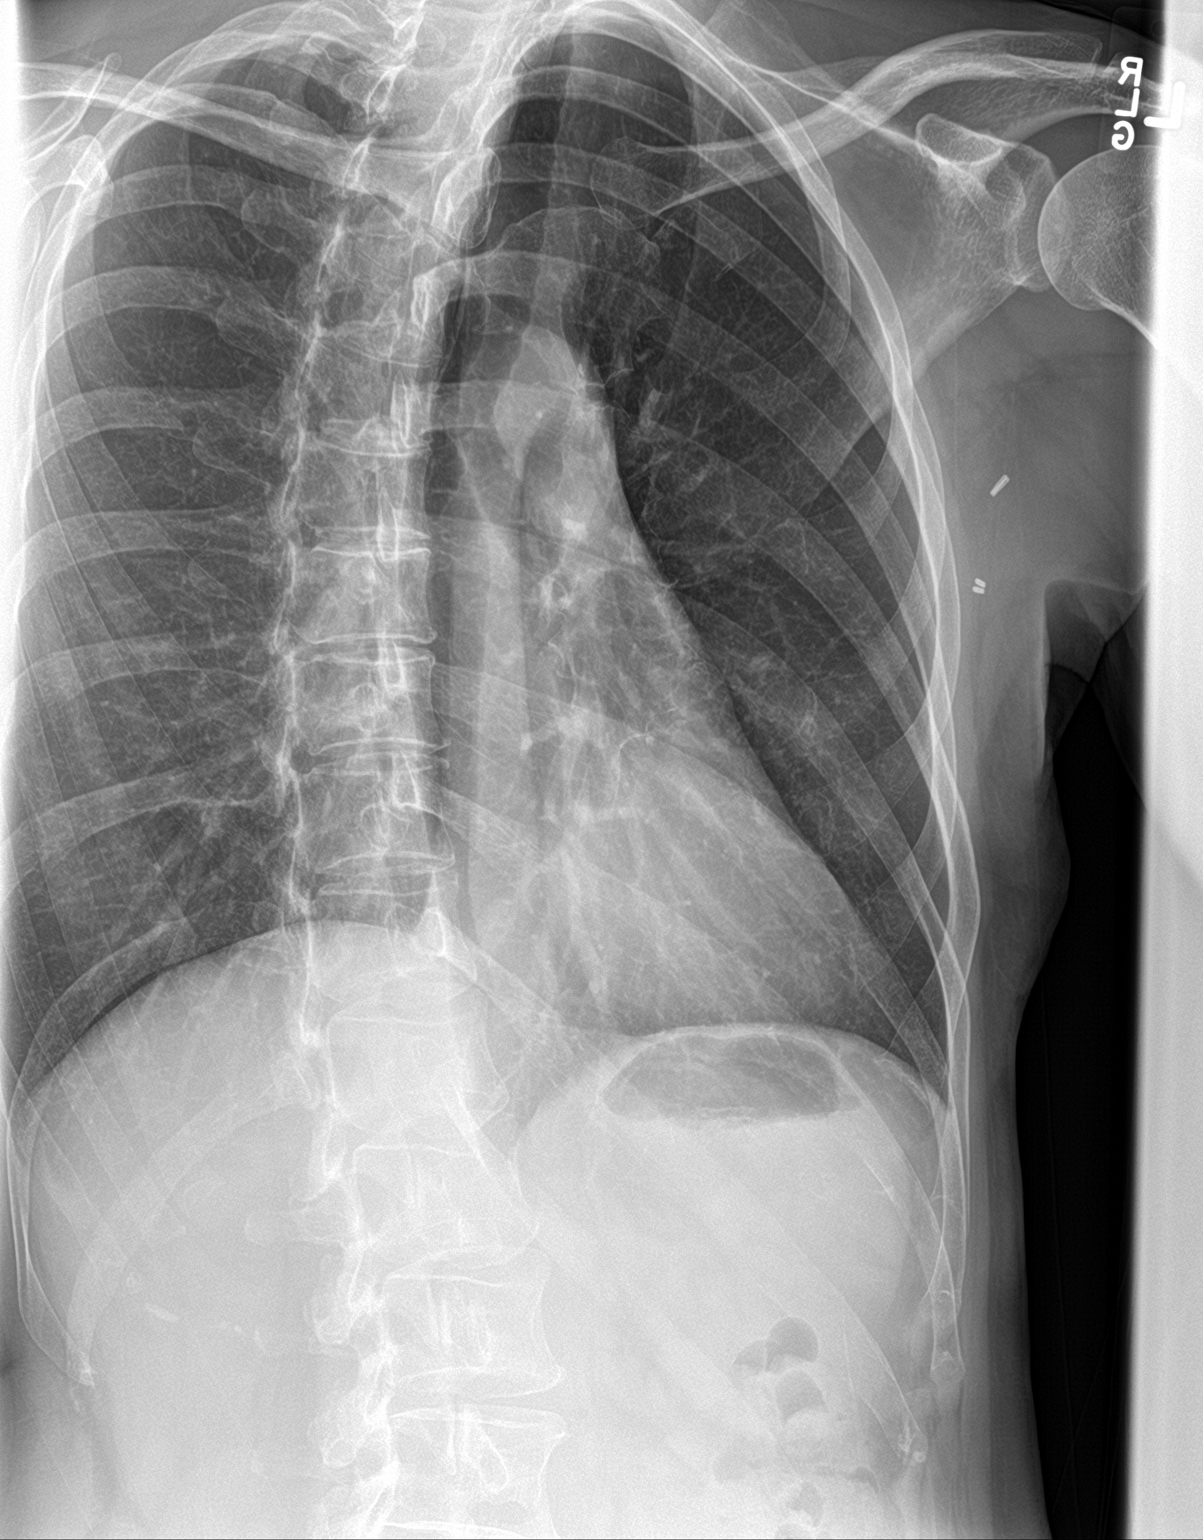

[rib pa obl (2 of 2)]
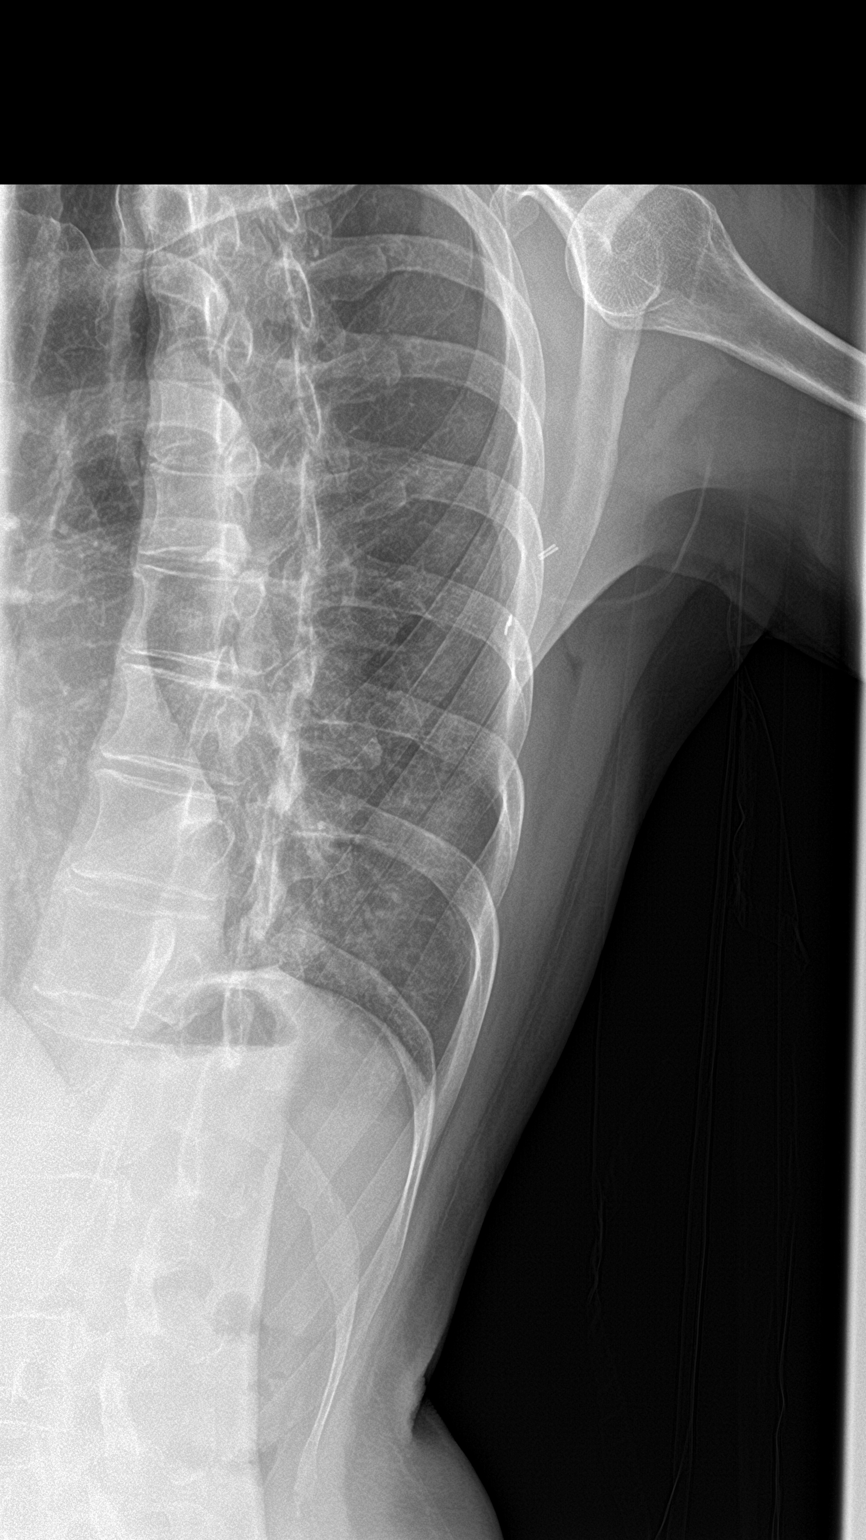

[3 of 3 positions shown; findings below may reference images not displayed]

FINDINGS: Surgical clips left axilla. No acute or focal bony abnormality. No
evidence of displaced fracture. No evidence of pneumothorax.
IMPRESSION: Surgical clips left axilla. Exam otherwise unremarkable. No acute or
focal bony abnormality. No pneumothorax.

## 2021-01-22 ENCOUNTER — Encounter: Payer: Self-pay | Admitting: Family Medicine

## 2021-01-22 NOTE — Telephone Encounter (Signed)
Pt also called the answering service--spoke to her. Sx started yesterday, tested + today.  Msg from answering service said requesting Paxlovid rx to pharmacy.  I don't think she specified antiviral med, husband is on the other.  Advised pt to either do e-visit today or virtual visit tomorrow morning (and she will contact us back if didn't seek care elsewhere, to add her to the schedule first thing).  Advised of the 2 different meds, that she may not meet criteria for treatment, poss of "rebound", and also advised of supportive measures for her symptoms today. She will do some research and will let us know if virtual visit is needed Monday morning

## 2021-01-25 ENCOUNTER — Encounter: Payer: Self-pay | Admitting: Family Medicine

## 2021-04-04 ENCOUNTER — Other Ambulatory Visit: Payer: Self-pay

## 2021-04-04 ENCOUNTER — Ambulatory Visit (INDEPENDENT_AMBULATORY_CARE_PROVIDER_SITE_OTHER): Payer: BC Managed Care – PPO | Admitting: Family Medicine

## 2021-04-04 VITALS — BP 100/62 | HR 57 | Temp 96.5°F | Wt 123.0 lb

## 2021-04-04 DIAGNOSIS — H9202 Otalgia, left ear: Secondary | ICD-10-CM | POA: Diagnosis not present

## 2021-04-04 NOTE — Progress Notes (Signed)
   Subjective:    Patient ID: Kristen Gamble, female    DOB: Jan 23, 1961, 60 y.o.   MRN: [id number]  HPI She complains of a 1 day history of left earache but only slight sore throat and nasal congestion with rhinorrhea.  She did do 2 COVID test which was negative.  No fever, chills, cough or congestion.   Review of Systems     Objective:   Physical Exam Alert and in no distress.  Both left and right TMs and canals are normal.  Throat is clear.  Neck is supple without adenopathy.       Assessment & Plan:  Acute otalgia, left I explained that at the present time I saw no reason to treat her with an antibiotic.  Explained that if continued symptoms we can always look again and it could have been that she came in before any signs occurred.

## 2021-04-18 DIAGNOSIS — E210 Primary hyperparathyroidism: Secondary | ICD-10-CM | POA: Diagnosis not present

## 2021-04-18 DIAGNOSIS — M8580 Other specified disorders of bone density and structure, unspecified site: Secondary | ICD-10-CM | POA: Diagnosis not present

## 2021-04-18 DIAGNOSIS — E039 Hypothyroidism, unspecified: Secondary | ICD-10-CM | POA: Diagnosis not present

## 2021-04-26 DIAGNOSIS — N200 Calculus of kidney: Secondary | ICD-10-CM | POA: Diagnosis not present

## 2021-04-26 DIAGNOSIS — M8580 Other specified disorders of bone density and structure, unspecified site: Secondary | ICD-10-CM | POA: Diagnosis not present

## 2021-04-26 DIAGNOSIS — E039 Hypothyroidism, unspecified: Secondary | ICD-10-CM | POA: Diagnosis not present

## 2021-04-28 DIAGNOSIS — Z23 Encounter for immunization: Secondary | ICD-10-CM | POA: Diagnosis not present

## 2021-06-01 ENCOUNTER — Other Ambulatory Visit: Payer: Self-pay | Admitting: Psychiatry

## 2021-06-19 DIAGNOSIS — M545 Low back pain, unspecified: Secondary | ICD-10-CM | POA: Diagnosis not present

## 2021-07-04 DIAGNOSIS — D2372 Other benign neoplasm of skin of left lower limb, including hip: Secondary | ICD-10-CM | POA: Diagnosis not present

## 2021-07-04 DIAGNOSIS — D1801 Hemangioma of skin and subcutaneous tissue: Secondary | ICD-10-CM | POA: Diagnosis not present

## 2021-07-04 DIAGNOSIS — L821 Other seborrheic keratosis: Secondary | ICD-10-CM | POA: Diagnosis not present

## 2021-07-19 DIAGNOSIS — M546 Pain in thoracic spine: Secondary | ICD-10-CM | POA: Diagnosis not present

## 2021-07-19 DIAGNOSIS — M545 Low back pain, unspecified: Secondary | ICD-10-CM | POA: Diagnosis not present

## 2021-07-27 ENCOUNTER — Ambulatory Visit (INDEPENDENT_AMBULATORY_CARE_PROVIDER_SITE_OTHER): Payer: BC Managed Care – PPO | Admitting: Psychiatry

## 2021-07-27 ENCOUNTER — Other Ambulatory Visit: Payer: Self-pay

## 2021-07-27 ENCOUNTER — Encounter: Payer: Self-pay | Admitting: Psychiatry

## 2021-07-27 DIAGNOSIS — F411 Generalized anxiety disorder: Secondary | ICD-10-CM

## 2021-07-27 DIAGNOSIS — F4001 Agoraphobia with panic disorder: Secondary | ICD-10-CM

## 2021-07-27 DIAGNOSIS — F5105 Insomnia due to other mental disorder: Secondary | ICD-10-CM | POA: Diagnosis not present

## 2021-07-27 DIAGNOSIS — F331 Major depressive disorder, recurrent, moderate: Secondary | ICD-10-CM

## 2021-07-27 DIAGNOSIS — F330 Major depressive disorder, recurrent, mild: Secondary | ICD-10-CM | POA: Diagnosis not present

## 2021-07-27 DIAGNOSIS — F900 Attention-deficit hyperactivity disorder, predominantly inattentive type: Secondary | ICD-10-CM

## 2021-07-27 MED ORDER — GABAPENTIN 100 MG PO CAPS: 200.0000 mg | ORAL_CAPSULE | Freq: Every day | ORAL | 0 refills | Status: DC

## 2021-07-27 MED ORDER — TRAZODONE HCL 100 MG PO TABS: ORAL_TABLET | ORAL | 3 refills | Status: DC

## 2021-07-27 MED ORDER — BUPROPION HCL ER (XL) 150 MG PO TB24: ORAL_TABLET | ORAL | 0 refills | Status: DC

## 2021-07-27 NOTE — Progress Notes (Signed)
Kristen Gamble [id number] [birth year] 61 y.o.  Subjective:   Patient ID:  Kristen Gamble is a 61 y.o. (DOB 11/05/1960) female.  Chief Complaint:  Chief Complaint  Patient presents with   Follow-up   Depression    Anxiety Patient reports no confusion, decreased concentration, nervous/anxious behavior, palpitations or suicidal ideas.    Depression        Associated symptoms include no decreased concentration and no suicidal ideas.  Past medical history includes anxiety.   last Kristen Gamble presents to the office today for follow-up of depression.  07/2020 appt:  seen  yearly..  No meds were changed.  She remained on gabapentin 300 nightly, Deplin 15, trazodone 200 nightly reduced to 100 mg HS.Kristen Gamble doing really well without problems.  Exercise and Alanon helps. Son in recovery. Is having more ADHD sx and ran out of NAC a couple of weeks ago.   Depression is better with med changes and therapy and support group.  Depression is managed.  Exercise helps anxiety. Couple panic with triggers but not severe.  Patient reports stable mood and denies depressed or irritable moods.  Patient reports episodic difficulty with anxiety.  Patient denies difficulty with sleep initiation or maintenance. Denies appetite disturbance.  Patient reports that energy and motivation have been good.  Patient denies any difficulty with concentration.  Patient denies any suicidal ideation.  Sleep good. Exercise a lot with [place] and loves it.  No alcohol. Seeing Kristen Gamble in therapy monthly and it's helpful.  Also attending [facility].  Father was also an alcoholic.  07/27/2021 appt noted: Added back NAC and it helps. Clearly. Depression occ including part of stress work. Wants to try weaning off gabapentin bc no longer needed for neuropathy. Feels flat a lot of time but can enjoy things. 2 grandkids. Oldest D med school 3 rd year and engaged.  Gabapentin for neuropathy.  Prior psychiatric medications include  methylphenidate,  mirtazapine 45 mg, trazodone,  Deplin, N-acetylcysteine.   She last took stimulants December 2018.    Review of Systems:  Review of Systems  Cardiovascular:  Negative for palpitations.  Neurological:  Negative for tremors and weakness.       Neuropathy better.  Psychiatric/Behavioral:  Negative for agitation, behavioral problems, confusion, decreased concentration, dysphoric mood, hallucinations, self-injury, sleep disturbance and suicidal ideas. The patient is not nervous/anxious and is not hyperactive.    Medications: I have reviewed the patient's current medications.  Current Outpatient Medications  Medication Sig Dispense Refill   Acetylcysteine (NAC) 500 MG CAPS Take by mouth.     Ascorbic Acid (VITAMIN C) 1000 MG tablet Take 1,000 mg by mouth daily.     cholecalciferol (VITAMIN D3) 25 MCG (1000 UNIT) tablet Take 1,000 Units by mouth daily.     fluticasone (FLONASE) 50 MCG/ACT nasal spray SHAKE LIQUID AND USE 2 SPRAYS IN EACH NOSTRIL DAILY 16 g 2   L-Methylfolate-Algae (DEPLIN 15) 15-90.314 MG CAPS TAKE 1 CAPSULE BY MOUTH EVERY DAY 90 capsule 3   loratadine (CLARITIN) 10 MG tablet Take 10 mg by mouth daily.     Multiple Vitamins-Minerals (MULTIVITAMIN WITH MINERALS) tablet Take 1 tablet daily by mouth.     polyethylene glycol (MIRALAX / GLYCOLAX) packet Take 17 g by mouth daily as needed.     thyroid (ARMOUR) 60 MG tablet Take 60 mg by mouth daily before breakfast.     vitamin B-12 (CYANOCOBALAMIN) 100 MCG tablet Take 100 mcg by mouth daily.     Zinc 22.5 MG TABS  Take 1 tablet by mouth daily.     buPROPion (WELLBUTRIN XL) 150 MG 24 hr tablet 1 in the AM for 1 week, then 2 each AM 30 tablet 0   gabapentin (NEURONTIN) 100 MG capsule Take 2 capsules (200 mg total) by mouth at bedtime. 60 capsule 0   traZODone (DESYREL) 100 MG tablet TAKE 2 TABLETS(200 MG) BY MOUTH AT BEDTIME 90 tablet 3   No current facility-administered medications for this visit.     Medication Side Effects: None  Allergies:  Allergies  Allergen Reactions   Poison Ivy Extract Rash   Prednisone Palpitations   Morphine Itching    REACTION: itching REACTION: itching    Past Medical History:  Diagnosis Date   ADHD (attention deficit hyperactivity disorder) 08/2011   Arthritis    BCC (basal cell carcinoma of skin) 05/2020   chest, Dr. Martinique   Breast cancer [facility]) 07/6107   left   Complication of anesthesia    nausea   Constipation    Family history of breast cancer    Family history of melanoma    Family history of rectal cancer    Generalized anxiety disorder    Hematuria    History of kidney stones    Major depression    treated by Dr. Clovis Pu   Nephrolithiasis    and stone distal R ureter on CT and R hydronephrosis   Numbness    under l arm (resolved/intermittent)   Osteopenia 05/2013   T score minus 1.7   PONV (postoperative nausea and vomiting)    TMJ (temporomandibular joint disorder)     Family History  Problem Relation Age of Onset   Diabetes Father    Alcohol abuse Father    Depression Father    Ulcers Father    Stomach cancer Father 57       treated with partial gastrectomy. Died of other causes at 54.   Diabetes Maternal Grandmother    Hypertension Maternal Grandmother    Heart attack Maternal Grandmother    Heart disease Maternal Grandmother    Hypothyroidism Maternal Grandmother    Melanoma Maternal Grandmother 60       d.90   Hypertension Maternal Grandfather    Stroke Maternal Grandfather    Asthma Maternal Grandfather    Colon polyps Maternal Grandfather    Heart attack Paternal Grandfather    Heart disease Paternal Grandfather 9       died of MI   Arthritis Mother    Hypothyroidism Mother    Heart disease Mother 42       pacemaker, afib   COPD Mother    Diabetes Brother    Breast cancer Paternal Aunt 80       d.70s   Rectal cancer Cousin 40       d.22s Daughter of unaffected maternal uncle.    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  She has boy/girl twins, and a daughter who graduated from [employer] and is in medical school at [employer]. Joslyn Hy is married, has 2 granddaughters.   Son (53 yo) alcoholic; was sent to rehab, and in sober living environment in [place] 03/2018. In college in [place], doing well. 07/2020--taking Spring semester off, working. May transfer to [employer]   Daughter is at [employer] (for film).   Helps care for her mother (who now lives nearby); also caring for in-laws (FIL in memory care; MIL with BKA, diabetic)   Social Determinants of Health   Financial Resource Strain: Not on file  Food Insecurity: Not on file  Transportation Needs: Not on file  Physical Activity: Not on file  Stress: Not on file  Social Connections: Not on file  Intimate Partner Violence: Not on file    Past Medical History, Surgical history, Social history, and Family history were reviewed and updated as appropriate.   Please see review of systems for further details on the patient's review from today.   Enjoys to granddaughters 58 and 77 years old  Objective:   Physical Exam:  There were no vitals taken for this visit.  Physical Exam Constitutional:       General: She is not in acute distress.    Appearance: She is well-developed.  Musculoskeletal:        General: No deformity.  Neurological:     Mental Status: She is alert and oriented to person, place, and time.     Motor: No tremor.     Coordination: Coordination normal.     Gait: Gait normal.  Psychiatric:        Attention and Perception: Attention and perception normal.        Mood and Affect: Mood is not anxious or depressed. Affect is not labile, blunt, angry or inappropriate.        Speech: Speech normal.        Behavior: Behavior normal.        Thought Content: Thought content normal. Thought content is not delusional. Thought content does not include homicidal or suicidal ideation. Thought content does not include suicidal plan.        Cognition and Memory: Cognition normal.        Judgment: Judgment normal.     Comments: Insight intact. No auditory or visual hallucinations. No delusions.  flat    Lab Review:     Component Value Date/Time   NA 140 08/03/2020 1131   NA 141 02/02/2015 0805   K 4.6 08/03/2020 1131   K 4.1 02/02/2015 0805   CL 103 08/03/2020 1131   CO2 23 08/03/2020 1131   CO2 26 02/02/2015 0805   GLUCOSE 93 08/03/2020 1131   GLUCOSE 118 (H) 04/19/2020 1849   GLUCOSE 80 02/02/2015 0805   BUN 14 08/03/2020 1131   BUN 20.3 02/02/2015 0805   CREATININE 0.79 08/03/2020 1131   CREATININE 0.7 02/02/2015 0805   CALCIUM 9.5 08/03/2020 1131   CALCIUM 10.6 (H) 02/02/2015 0805   PROT 7.2 08/03/2020 1131   PROT 6.9 02/02/2015 0805   ALBUMIN 4.7 08/03/2020 1131   ALBUMIN 4.0 02/02/2015 0805  AST 39 08/03/2020 1131   AST 26 02/02/2015 0805   ALT 25 08/03/2020 1131   ALT 28 02/02/2015 0805   ALKPHOS 68 08/03/2020 1131   ALKPHOS 83 02/02/2015 0805   BILITOT 0.3 08/03/2020 1131   BILITOT 0.38 02/02/2015 0805   GFRNONAA 83 08/03/2020 1131   GFRAA 95 08/03/2020 1131       Component Value Date/Time   WBC 5.0 08/03/2020 1131   WBC 5.2 04/19/2020 1849    RBC 4.34 08/03/2020 1131   RBC 4.16 04/19/2020 1849   HGB 13.8 08/03/2020 1131   HGB 13.8 02/02/2015 0805   HCT 41.0 08/03/2020 1131   HCT 41.4 02/02/2015 0805   PLT 227 08/03/2020 1131   MCV 95 08/03/2020 1131   MCV 96.1 02/02/2015 0805   MCH 31.8 08/03/2020 1131   MCH 32.5 04/19/2020 1849   MCHC 33.7 08/03/2020 1131   MCHC 34.8 04/19/2020 1849   RDW 12.6 08/03/2020 1131   RDW 13.5 02/02/2015 0805   LYMPHSABS 1.9 08/03/2020 1131   LYMPHSABS 1.7 02/02/2015 0805   MONOABS 0.3 04/25/2016 1020   MONOABS 0.4 02/02/2015 0805   EOSABS 0.1 08/03/2020 1131   BASOSABS 0.0 08/03/2020 1131   BASOSABS 0.0 02/02/2015 0805    No results found for: POCLITH, LITHIUM   No results found for: PHENYTOIN, PHENOBARB, VALPROATE, CBMZ   .res Assessment: Plan:    Generalized anxiety disorder - Plan: gabapentin (NEURONTIN) 100 MG capsule, traZODone (DESYREL) 100 MG tablet  Mild recurrent major depression ([place]) - Plan: buPROPion (WELLBUTRIN XL) 150 MG 24 hr tablet  Panic disorder with agoraphobia - Plan: traZODone (DESYREL) 100 MG tablet  Insomnia due to mental condition - Plan: gabapentin (NEURONTIN) 100 MG capsule, traZODone (DESYREL) 100 MG tablet  Attention deficit hyperactivity disorder (ADHD), predominantly inattentive type - Plan: buPROPion (WELLBUTRIN XL) 150 MG 24 hr tablet  Major depressive disorder, recurrent episode, moderate ([place]) - Plan: traZODone (DESYREL) 100 MG tablet  Done and symptoms have been largely controlled through the combination of increasing the trazodone, therapy with Kristen Gamble and [facility].  She is satisfied with her current treatment.  Continue NAC 1200 mg daily for ADD off label. Other options if needed.  Disc ways to taper gabapentin over a month.  If still flat, trial Wellbutrin which could also help focus and energy up to 300 mg AM  She appears to be fairly medication sensitive but is tolerating these meds well.  The NAC is being used off label for ADD and  cognitive reasons.  She does not want to take stimulants.  Follow up 1 year  Lynder Parents, MD, DFAPA  Please see After Visit Summary for patient specific instructions. Reduce gabapentin to 2 of the 100 mg capsules at night for 2 weeks, then reduce to 1 capsule at night for 2 weeks, then stop gabapentin  Start bupropion or Wellbutrin 1 in the morning for 1 week, then 2 in the morning which equals 300 mg.  Increase NAC to 2 of the 600 mg capsules each morning.  Future Appointments  Date Time Provider [place]  08/07/2021  8:45 AM Rita Ohara, MD [facility]    No orders of the defined types were placed in this encounter.     -------------------------------

## 2021-07-27 NOTE — Patient Instructions (Addendum)
Reduce gabapentin to 2 of the 100 mg capsules at night for 2 weeks, then reduce to 1 capsule at night for 2 weeks, then stop gabapentin  Start bupropion or Wellbutrin 1 in the morning for 1 week, then 2 in the morning which equals 300 mg.  Increase NAC to 2 of the 600 mg capsules each morning.

## 2021-08-06 ENCOUNTER — Encounter: Payer: Self-pay | Admitting: Family Medicine

## 2021-08-06 NOTE — Progress Notes (Signed)
Chief Complaint  Patient presents with   Annual Exam    Fasting annual exam, no pap sees Dr. Helane Rima. Will take Tdap today and get Shingrix in a few weeks on a Friday. Dr. Clovis Pu has lowered her gabapentin and she is getting off of this. Has been light headed lately, and chest pressure-thinks it is stress.    Kristen Gamble is a 61 y.o. female who presents for a complete physical.    She is noticing some lightheadedness when she wakes up, and sometimes throughout the day, with standing.  This started a few weeks ago, worse last week when sick with diarrhea.  New medication started during that time frame as well (Wellbutrin).  She is cutting back on some of her medications with her psychiatrist, due to feeling tired a lot. See below.  Last year she reported numbness in feet L>R when hiking, starting after 3 miles.  She got new boots, and this is much better, only occasionally occurs now.  Palpitations--occurring a few times/week, fast but short-lived (so unable to count her pulse).  It is a little more frequent, thinks due to stress. Never occurs with exertion, usually it  is when she is just sitting, watching TV or going to bed, sometimes when working. She previously saw Dr. Radford Pax.  Normal stress myoview in 12/2014.  LBP: she still has some ongoing tightness and pain L lower back, and left neck and shoulder. She has been getting dry needling which helps. She does this just as needed, sometimes can go several months without needing any treatment. She also uses foam roller, stretches, gets regular exercise.  History of L breast cancer.  She had been under the care of Dr. [name], but was released from care at last visit, in 08/2020.  She had grade 1 invasive ductal carcinoma, strongly estrogen and progesterone receptor positive, HER2/neu negative.  She is s/p bilateral mastectomy, with subpectoral implant placement July 2008.  Implants were removed due to rupture on 12/16/2017.   She continues on  gabapentin, started by Dr. [name] for complaints of shooting pains in L chest, and also hot flashes.  The shooting pains are improved, but still having some discomfort at the left chest, sore spots (previously evaluated with x-rays and CT scan, no etiology found). She continues to have these, suspects muscular related to her activity. No significant change to her discomfort. She gets a little bit of pressure or muscle soreness on the L chest.  No longer getting the electrical pains, even as her gabapentin has been decreased.  She had been taking 330m qHS.  Her dose was recently lowered to 2070mqHS by Dr. CoClovis Puwhich she took for 2 weeks, and plans to taper further to 10066mater this week, then taper off completely, if no recurrent symptoms.  She is under the care of Dr. CotClovis Pur anxiety and ADD.  Last seen earlier in the month. Wellbutrin was just added, went up to the 300m13mse since Friday. This was to help with focus and depression symptoms.  She usesTrazadone for sleep. She cut the dose of trazodone from 200mg41m100mg 35mer own about 8 months ago due to being too groggy in the morning.  She usually falls asleep fine. Less groggy in the morning since cutting the dose back, but still fatigued during the day. "I feel flat", "I'm tired all the time". Hasn't been meditating as therapist previously recommended (to slow down morning routine). This is a busy season at work,  stressful.  Daughter is engaged to be married.  Hypothyroidism:  Currently under the care of Dr. Chalmers Cater.  She last saw her in October and was told that her thyroid levels were fine.  Her cortisol levels were elevated, and she will be monitoring this.  Denies changes to her hair (?slight thinning)/skin/bowels weight.. Decreased energy persists, see above.  No records received from Dr. Chalmers Cater.  Lab Results  Component Value Date   TSH 1.800 08/03/2020   Osteopenia:  She had previously received IV bisphosphonate therapy (while on  cancer treatment in 2008).  She had 3 doses, 6 months apart.  Went to ER after the first dose, had a lower dose the second and third doses. Switched oncologists to Dr. [name] (from [facility]), who took her off the bisphosphonate, and put her on Tamoxifen. She has been off tamoxifen since 2015.  Last DEXA was done 12/2020, ordered by Dr. Chalmers Cater. T -2.2 R fem neck, T-2.0 L fem neck. She states this wasn't discussed at her last visit.   Immunization History  Administered Date(s) Administered   Hepatitis A, Adult 11/12/2014   Influenza, Quadrivalent, Recombinant, Inj, Pf 04/25/2020, 04/28/2021   Influenza,inj,Quad PF,6+ Mos 04/10/2018, 04/25/2019   Influenza-Unspecified 06/04/2017, 04/16/2019, 04/25/2020   PFIZER(Purple Top)SARS-COV-2 Vaccination 09/30/2019, 10/21/2019, 06/14/2020   Pfizer Covid-19 Vaccine Bivalent Booster 49yr & up 05/28/2021   Tdap 10/22/2011   Typhoid Live 11/12/2014   Last pap smear: 2013, normal, by Dr. FPhineas Real  She reports she has had one since, with Dr. GHelane Rima  She is TAH/BSO 03/2007 with benign pathology. Sees Dr. GHelane Rimayearly in April. Last mammogram: n/a since mastectomy Last colonoscopy: 04/24/2016, Dr. BCristina Gong Diverticulosis noted. She has h/o polyps Last DEXA: 12/2020 T-2.2 R fem neck, T-2.0 L fem neck.  Dentist: twice yearly Ophtho: once yearly Exercise: Has Peloton and treadmill, exercising 4-5 days/week, plus some walking.  She works with trainer 2x/week, and does weights once/week on her own.  She backed off for a bit, but after DEXA in June, picked back up.  Vitamin D-OH level last checked in 05/2019, normal at 456  PMH, PSH, SH and FH were reviewed and updated.  Outpatient Encounter Medications as of 08/07/2021  Medication Sig Note   Acetylcysteine (NAC) 500 MG CAPS Take by mouth.    Ascorbic Acid (VITAMIN C) 1000 MG tablet Take 1,000 mg by mouth daily.    buPROPion (WELLBUTRIN XL) 150 MG 24 hr tablet 1 in the AM for 1 week, then 2 each AM 08/07/2021:  Taking 3059m2 15054mogether   cholecalciferol (VITAMIN D3) 25 MCG (1000 UNIT) tablet Take 1,000 Units by mouth daily.    fluticasone (FLONASE) 50 MCG/ACT nasal spray SHAKE LIQUID AND USE 2 SPRAYS IN EACH NOSTRIL DAILY    gabapentin (NEURONTIN) 100 MG capsule Take 2 capsules (200 mg total) by mouth at bedtime. 08/07/2021: Tapering off   L-Methylfolate-Algae (DEPLIN 15) 15-90.314 MG CAPS TAKE 1 CAPSULE BY MOUTH EVERY DAY    loratadine (CLARITIN) 10 MG tablet Take 10 mg by mouth daily.    Multiple Vitamins-Minerals (MULTIVITAMIN WITH MINERALS) tablet Take 1 tablet daily by mouth.    polyethylene glycol (MIRALAX / GLYCOLAX) packet Take 17 g by mouth daily as needed. 08/03/2020: Uses daily   thyroid (ARMOUR) 60 MG tablet Take 60 mg by mouth daily before breakfast. 06/10/2019: Does not take on Wed or Sunday   traZODone (DESYREL) 100 MG tablet TAKE 2 TABLETS(200 MG) BY MOUTH AT BEDTIME (Patient taking differently: Take 100 mg by mouth at bedtime.  TAKE 2 TABLETS(200 MG) BY MOUTH AT BEDTIME)    vitamin B-12 (CYANOCOBALAMIN) 100 MCG tablet Take 100 mcg by mouth daily. 08/07/2021: Twice a week   Zinc 22.5 MG TABS Take 1 tablet by mouth daily. 08/07/2021: Three times a week   No facility-administered encounter medications on file as of 08/07/2021.   Allergies  Allergen Reactions   Poison Ivy Extract Rash   Prednisone Palpitations   Morphine Itching    REACTION: itching REACTION: itching    ROS:  The patient denies anorexia, fever, weight changes, headaches, decreased hearing, ear pain, sore throat, breast concerns, syncope, dyspnea on exertion, cough, swelling, nausea, vomiting, diarrhea, abdominal pain, melena, hematochezia, indigestion/heartburn, hematuria, dysuria, vaginal bleeding, discharge, odor or itch, genital lesions, joint pains (just in fingers/hands/thumbs, known OA),  weakness, tremor, suspicious skin lesions (sees derm yearly), abnormal bleeding/bruising, or enlarged lymph nodes. Urge  incontinence some mornings, sometimes urge with a "bad sneeze", unchanged. Hot flashes at night, only occasionally. Moods--per HPI, increased stress, some depression, +fatigue. Palpitations per HPI, slightly more frequent with increased stress recently. Chest pains, left sided, chronic (see HPI), never exertional, and not particularly worse with current stress. Constipation is controlled by Miralax. Vaginal dryness, pain with intercourse (improved with vitamin E suppositories), decreased libido Diarrhea last week (hardly could eat anything) slowly improving. Saw Dr. Martinique (derm) last month. Numbness in feet is infrequent/improved Some blurred vision, scheduled to see ophtho Occ SOB with running up stairs   PHYSICAL EXAM:  BP 92/60    Pulse (!) 56    Ht 5' 3.5" (1.613 m)    Wt 121 lb 6.4 oz (55.1 kg)    BMI 21.17 kg/m   Wt Readings from Last 3 Encounters:  08/07/21 121 lb 6.4 oz (55.1 kg)  04/04/21 123 lb (55.8 kg)  09/07/20 124 lb 8 oz (56.5 kg)    General Appearance:    Alert, cooperative, no distress, appears stated age  Head:    Normocephalic, without obvious abnormality, atraumatic  Eyes:    PERRL, conjunctiva and sclera are clear. EOM's intact, fundi benign  Ears:    Normal TM's and external ear canals  Nose:   Not examined, wearing mask due to COVID-19 pandemic  Throat:   Not examined, wearing mask due to COVID-19 pandemic  Neck:   Supple, no lymphadenopathy;  thyroid:  no enlargement/ tenderness/nodules; no carotid bruit or JVD  Back:    Spine nontender, no curvature, ROM normal, no CVA  tenderness  Lungs:    Clear to auscultation bilaterally without wheezes, rales or  ronchi; respirations unlabored  Chest Wall:   Absence of breasts. WHSS (horizontal) bilaterally. No axillary lymphadenopathy. Nontender.   Heart:    Bradycardic.  S1 and S2 normal, no murmur, rub or gallop, no ectopy  Breast Exam:   See chest wall above.  Abdomen:     Soft, non-tender, nondistended,  normoactive bowel sounds,    no masses, no hepatosplenomegaly  Genitalia:    Deferred to GYN       Extremities:   No clubbing, cyanosis or edema  Pulses:   2+ and symmetric all extremities  Skin:   Skin color, texture, turgor normal, no rashes or lesions  Lymph nodes:   Cervical, supraclavicular, and axillary nodes normal  Neurologic:   Normal strength, sensation and gait; reflexes 2+ and symmetric throughout  Psych:   Normal mood, affect, hygiene and grooming.  Urine dip: SG 1.020, sm blood (which is chronic), o/w negative  Depression screen [facility] 2/9 08/07/2021 08/03/2020 05/25/2019  Decreased Interest 1 0 0  Down, Depressed, Hopeless 1 0 0  PHQ - 2 Score 2 0 0  Altered sleeping 1 - -  Tired, decreased energy 3 - -  Change in appetite 1 - -  Feeling bad or failure about yourself  1 - -  Trouble concentrating 0 - -  Moving slowly or fidgety/restless 0 - -  Suicidal thoughts 0 - -  PHQ-9 Score 8 - -  Difficult doing work/chores Somewhat difficult - -    ASSESSMENT/PLAN:  Annual physical exam - Plan: POCT Urinalysis DIP (Proadvantage Device), CBC with Differential/Platelet, Comprehensive metabolic panel, Lipid panel  Osteopenia of necks of both femurs - discussed need for adequate Ca, D, weight-bearing exercise. recheck 2 years  Hypothyroidism, unspecified type - adequately replaced per pt report, monitored by Dr. Chalmers Cater  GAD (generalized anxiety disorder) - under care of psych. Recently started on wellbutrin. Encouraged counseling. Reviewed breathing techniques, rec restart meditation; cont exercise, meds  Personal history of breast cancer  Need for Tdap vaccination - Plan: Tdap vaccine greater than or equal to 7yo IM  Fatigue, unspecified type - multifactorial. Plan to wean off gabapentin. If sleeping well, can d/w psych cutting back trazodone. r/o anemia, other causes - Plan: CBC with Differential/Platelet, Comprehensive metabolic  panel  Hypercholesteremia - LDL higher on last check than prior. Recheck today. Low chol diet rec - Plan: Lipid panel  Lightheadedness - poss related to new med (wellbutrin); urine concentrated--encouraged hydration.  Check labs.  Pt will continue to taper down of gabapentin. If has any recurrent neuropathy/chest pain, or any hot flashes, RLS or other symptoms that med may have been helping with, can restart at the lowest effective dose.  She will let us know if any add'l rx is needed.  Counseled in detail re: stress/anxiety, meditation, breathing techniques, journaling. Consider restarting form counseling.   Recommended at least 30 minutes of aerobic activity at least 5 days/week, weight-bearing exercise at least 2x/week; proper sunscreen use reviewed; healthy diet, including goals of calcium and vitamin D intake and alcohol recommendations (less than or equal to 1 drink/day) reviewed; regular seatbelt use; changing batteries in smoke detectors.  Immunization recommendations discussed--continue yearly flu shots.  TdaP given today. Shingrix recommended, risks/SE reviewed. To schedule NV when convenient. Colonoscopy recommendations reviewed--UTD.    F/u 1 year, sooner prn

## 2021-08-06 NOTE — Patient Instructions (Addendum)
°  HEALTH MAINTENANCE RECOMMENDATIONS:  It is recommended that you get at least 30 minutes of aerobic exercise at least 5 days/week (for weight loss, you may need as much as 60-90 minutes). This can be any activity that gets your heart rate up. This can be divided in 10-15 minute intervals if needed, but try and build up your endurance at least once a week.  Weight bearing exercise is also recommended twice weekly.  Eat a healthy diet with lots of vegetables, fruits and fiber.  "Colorful" foods have a lot of vitamins (ie green vegetables, tomatoes, red peppers, etc).  Limit sweet tea, regular sodas and alcoholic beverages, all of which has a lot of calories and sugar.  Up to 1 alcoholic drink daily may be beneficial for women (unless trying to lose weight, watch sugars).  Drink a lot of water.  Calcium recommendations are 1200-1500 mg daily (1500 mg for postmenopausal women or women without ovaries), and vitamin D 1000 IU daily.  This should be obtained from diet and/or supplements (vitamins), and calcium should not be taken all at once, but in divided doses.  Monthly self breast exams and yearly mammograms for women over the age of 77 is recommended.  Sunscreen of at least SPF 30 should be used on all sun-exposed parts of the skin when outside between the hours of 10 am and 4 pm (not just when at beach or pool, but even with exercise, golf, tennis, and yard work!)  Use a sunscreen that says "broad spectrum" so it covers both UVA and UVB rays, and make sure to reapply every 1-2 hours.  Remember to change the batteries in your smoke detectors when changing your clock times in the spring and fall. Carbon monoxide detectors are recommended for your home.  Use your seat belt every time you are in a car, and please drive safely and not be distracted with cell phones and texting while driving.  Consider going back for counseling.  Definitely restart the morning meditation, and remember some of the  suggestions and techniques that you previously learned.  Contact [facility] to discuss when next colonoscopy is due (5 years (which is past) vs 7 vs 10). You have a history of adenomatous polyps.  Stay well hydrated, with goal of very pale urine.

## 2021-08-07 ENCOUNTER — Ambulatory Visit (INDEPENDENT_AMBULATORY_CARE_PROVIDER_SITE_OTHER): Payer: BC Managed Care – PPO | Admitting: Family Medicine

## 2021-08-07 ENCOUNTER — Other Ambulatory Visit: Payer: Self-pay

## 2021-08-07 ENCOUNTER — Encounter: Payer: Self-pay | Admitting: Family Medicine

## 2021-08-07 VITALS — BP 92/60 | HR 56 | Ht 63.5 in | Wt 121.4 lb

## 2021-08-07 DIAGNOSIS — E039 Hypothyroidism, unspecified: Secondary | ICD-10-CM | POA: Diagnosis not present

## 2021-08-07 DIAGNOSIS — Z0000 Encounter for general adult medical examination without abnormal findings: Secondary | ICD-10-CM | POA: Diagnosis not present

## 2021-08-07 DIAGNOSIS — Z23 Encounter for immunization: Secondary | ICD-10-CM

## 2021-08-07 DIAGNOSIS — M85851 Other specified disorders of bone density and structure, right thigh: Secondary | ICD-10-CM | POA: Diagnosis not present

## 2021-08-07 DIAGNOSIS — E7800 Pure hypercholesterolemia, unspecified: Secondary | ICD-10-CM

## 2021-08-07 DIAGNOSIS — Z853 Personal history of malignant neoplasm of breast: Secondary | ICD-10-CM

## 2021-08-07 DIAGNOSIS — R5383 Other fatigue: Secondary | ICD-10-CM

## 2021-08-07 DIAGNOSIS — F411 Generalized anxiety disorder: Secondary | ICD-10-CM | POA: Diagnosis not present

## 2021-08-07 DIAGNOSIS — R42 Dizziness and giddiness: Secondary | ICD-10-CM

## 2021-08-07 DIAGNOSIS — M85852 Other specified disorders of bone density and structure, left thigh: Secondary | ICD-10-CM

## 2021-08-07 LAB — POCT URINALYSIS DIP (PROADVANTAGE DEVICE)
Bilirubin, UA: NEGATIVE
Glucose, UA: NEGATIVE mg/dL
Ketones, POC UA: NEGATIVE mg/dL
Leukocytes, UA: NEGATIVE
Nitrite, UA: NEGATIVE
Protein Ur, POC: NEGATIVE mg/dL
Specific Gravity, Urine: 1.02
Urobilinogen, Ur: NEGATIVE
pH, UA: 6 (ref 5.0–8.0)

## 2021-08-08 ENCOUNTER — Encounter: Payer: Self-pay | Admitting: Family Medicine

## 2021-08-08 LAB — COMPREHENSIVE METABOLIC PANEL
ALT: 16 IU/L (ref 0–32)
AST: 21 IU/L (ref 0–40)
Albumin/Globulin Ratio: 2.1 (ref 1.2–2.2)
Albumin: 4.7 g/dL (ref 3.8–4.9)
Alkaline Phosphatase: 64 IU/L (ref 44–121)
BUN/Creatinine Ratio: 23 (ref 9–23)
BUN: 17 mg/dL (ref 6–24)
Bilirubin Total: 0.3 mg/dL (ref 0.0–1.2)
CO2: 25 mmol/L (ref 20–29)
Calcium: 9.3 mg/dL (ref 8.7–10.2)
Chloride: 101 mmol/L (ref 96–106)
Creatinine, Ser: 0.75 mg/dL (ref 0.57–1.00)
Globulin, Total: 2.2 g/dL (ref 1.5–4.5)
Glucose: 92 mg/dL (ref 70–99)
Potassium: 4 mmol/L (ref 3.5–5.2)
Sodium: 139 mmol/L (ref 134–144)
Total Protein: 6.9 g/dL (ref 6.0–8.5)
eGFR: 92 mL/min/{1.73_m2} (ref >59)

## 2021-08-08 LAB — CBC WITH DIFFERENTIAL/PLATELET
Basophils Absolute: 0 10*3/uL (ref 0.0–0.2)
Basos: 0 %
EOS (ABSOLUTE): 0 10*3/uL (ref 0.0–0.4)
Eos: 1 %
Hematocrit: 40.6 % (ref 34.0–46.6)
Hemoglobin: 14 g/dL (ref 11.1–15.9)
Immature Grans (Abs): 0 10*3/uL (ref 0.0–0.1)
Immature Granulocytes: 0 %
Lymphocytes Absolute: 1.3 10*3/uL (ref 0.7–3.1)
Lymphs: 35 %
MCH: 32 pg (ref 26.6–33.0)
MCHC: 34.5 g/dL (ref 31.5–35.7)
MCV: 93 fL (ref 79–97)
Monocytes Absolute: 0.3 10*3/uL (ref 0.1–0.9)
Monocytes: 9 %
Neutrophils Absolute: 2.1 10*3/uL (ref 1.4–7.0)
Neutrophils: 55 %
Platelets: 205 10*3/uL (ref 150–450)
RBC: 4.38 x10E6/uL (ref 3.77–5.28)
RDW: 12.3 % (ref 11.7–15.4)
WBC: 3.8 10*3/uL (ref 3.4–10.8)

## 2021-08-08 LAB — LIPID PANEL
Chol/HDL Ratio: 2.9 ratio (ref 0.0–4.4)
Cholesterol, Total: 261 mg/dL — ABNORMAL HIGH (ref 100–199)
HDL: 90 mg/dL (ref >39)
LDL Chol Calc (NIH): 162 mg/dL — ABNORMAL HIGH (ref 0–99)
Triglycerides: 60 mg/dL (ref 0–149)
VLDL Cholesterol Cal: 9 mg/dL (ref 5–40)

## 2021-08-11 ENCOUNTER — Other Ambulatory Visit: Payer: Self-pay | Admitting: Psychiatry

## 2021-08-11 DIAGNOSIS — F900 Attention-deficit hyperactivity disorder, predominantly inattentive type: Secondary | ICD-10-CM

## 2021-08-11 DIAGNOSIS — F330 Major depressive disorder, recurrent, mild: Secondary | ICD-10-CM

## 2021-08-23 ENCOUNTER — Other Ambulatory Visit: Payer: Self-pay | Admitting: Psychiatry

## 2021-08-23 DIAGNOSIS — F411 Generalized anxiety disorder: Secondary | ICD-10-CM

## 2021-08-23 DIAGNOSIS — F5105 Insomnia due to other mental disorder: Secondary | ICD-10-CM

## 2021-08-23 NOTE — Telephone Encounter (Signed)
Per Dr. Casimiro Needle last note patient is being weaned off this medication. Called patient to see if she needs a refill to complete the wean.

## 2021-09-01 ENCOUNTER — Other Ambulatory Visit: Payer: BC Managed Care – PPO

## 2021-09-11 ENCOUNTER — Encounter: Payer: Self-pay | Admitting: Family Medicine

## 2021-09-13 ENCOUNTER — Ambulatory Visit (INDEPENDENT_AMBULATORY_CARE_PROVIDER_SITE_OTHER): Payer: BC Managed Care – PPO | Admitting: Medical

## 2021-09-13 ENCOUNTER — Other Ambulatory Visit: Payer: Self-pay

## 2021-09-13 VITALS — BP 110/70 | HR 69 | Temp 98.8°F | Wt 116.8 lb

## 2021-09-13 DIAGNOSIS — E079 Disorder of thyroid, unspecified: Secondary | ICD-10-CM | POA: Diagnosis not present

## 2021-09-13 DIAGNOSIS — R12 Heartburn: Secondary | ICD-10-CM

## 2021-09-13 DIAGNOSIS — R195 Other fecal abnormalities: Secondary | ICD-10-CM | POA: Diagnosis not present

## 2021-09-13 DIAGNOSIS — R630 Anorexia: Secondary | ICD-10-CM | POA: Diagnosis not present

## 2021-09-13 DIAGNOSIS — R109 Unspecified abdominal pain: Secondary | ICD-10-CM | POA: Diagnosis not present

## 2021-09-13 MED ORDER — OMEPRAZOLE 40 MG PO CPDR: 40.0000 mg | DELAYED_RELEASE_CAPSULE | Freq: Every day | ORAL | 0 refills | Status: DC

## 2021-09-13 NOTE — Progress Notes (Signed)
Subjective: ? Kristen Gamble is a 61 y.o. female who presents for ?Chief Complaint  ?Patient presents with  ? stomach issues  ?  Stomach issues since January 16th. Having some diarrhea and indigestion.but some times having trouble going to the bathroom since eating lots of bland food  ?   ?She notes week of 07/31/21 was having what seemed like a gastroenteritis. Had several days of diarrhea, upset stomach, cramping . Got better, but then the next week had similar symptoms.  Had been having loose stools daily every time with eating for about a month.  About a week ago loose stools calmed down.   ? ?This past week was having a lot of heartburn.  Hasn't been able to drink coffee due to pain.  Has been feeling lightheaded, decreased appetite, has had some weight loss.  Not eating as much the loose stool has improved some.    ? ?Even some vegetables today at lunch has given her some cramping.   ? ?No urinary complaints, no urgency, no frequency, no blood in urine.  No fever, no body aches or chills.  No blood in stool.   Has hx/o constipation in general.   ? ?Dr. Chalmers Cater manages her thyroid issues.   ? ?Doesn't eat a lot of spicy foods.  No prior ulcer.  Nonsmoker, no heavy alcohol use.  ? ?Does have a decent amount of stress currently. ? ?No recent travel, no camping.  No suspicious food in last few months for contamination other than some rare tuna occasionally. ? ?Father had history of ulcers and stomach cancer, heavy smoker and drinker. ? ?No other aggravating or relieving factors.   ? ?No other c/o. ? ?Past Medical History:  ?Diagnosis Date  ? ADHD (attention deficit hyperactivity disorder) 08/2011  ? Arthritis   ? BCC (basal cell carcinoma of skin) 05/2020  ? chest, Dr. Martinique  ? Breast cancer ([place]) 10/2006  ? left  ? Complication of anesthesia   ? nausea  ? Constipation   ? COVID-19 virus infection 01/2021  ? Family history of breast cancer   ? Family history of melanoma   ? Family history of rectal cancer   ? Generalized  anxiety disorder   ? Hematuria   ? History of kidney stones   ? Major depression   ? treated by Dr. Clovis Pu  ? Nephrolithiasis   ? and stone distal R ureter on CT and R hydronephrosis  ? Numbness   ? under l arm (resolved/intermittent)  ? Osteopenia 05/2013  ? T score minus 1.7  ? PONV (postoperative nausea and vomiting)   ? TMJ (temporomandibular joint disorder)   ? ?Current Outpatient Medications on File Prior to Visit  ?Medication Sig Dispense Refill  ? Acetylcysteine (NAC) 500 MG CAPS Take by mouth.    ? Ascorbic Acid (VITAMIN C) 1000 MG tablet Take 1,000 mg by mouth daily.    ? buPROPion (WELLBUTRIN XL) 150 MG 24 hr tablet Take 2 tablets (300 mg total) by mouth daily. 180 tablet 3  ? cholecalciferol (VITAMIN D3) 25 MCG (1000 UNIT) tablet Take 1,000 Units by mouth daily.    ? fluticasone (FLONASE) 50 MCG/ACT nasal spray SHAKE LIQUID AND USE 2 SPRAYS IN EACH NOSTRIL DAILY 16 g 2  ? L-Methylfolate-Algae (DEPLIN 15) 15-90.314 MG CAPS TAKE 1 CAPSULE BY MOUTH EVERY DAY 90 capsule 3  ? loratadine (CLARITIN) 10 MG tablet Take 10 mg by mouth daily.    ? Multiple Vitamins-Minerals (MULTIVITAMIN WITH MINERALS) tablet Take  1 tablet daily by mouth.    ? polyethylene glycol (MIRALAX / GLYCOLAX) packet Take 17 g by mouth daily as needed.    ? thyroid (ARMOUR) 60 MG tablet Take 60 mg by mouth daily before breakfast.    ? traZODone (DESYREL) 100 MG tablet TAKE 2 TABLETS(200 MG) BY MOUTH AT BEDTIME (Patient taking differently: Take 100 mg by mouth at bedtime. TAKE 2 TABLETS(200 MG) BY MOUTH AT BEDTIME) 90 tablet 3  ? vitamin B-12 (CYANOCOBALAMIN) 100 MCG tablet Take 100 mcg by mouth daily.    ? Zinc 22.5 MG TABS Take 1 tablet by mouth daily.    ? ?No current facility-administered medications on file prior to visit.  ? ?Past Surgical History:  ?Procedure Laterality Date  ? ABDOMINAL HYSTERECTOMY    ? [place]  ? BREAST BIOPSY    ? x4 in 2009-2012, all benign  ? BREAST SURGERY  2008  ? Lumpectomy-Bilateral  mastectomy-implants  ? COLPOSCOPY    ? CYSTOSCOPY    ? CYSTOSCOPY WITH RETROGRADE PYELOGRAM, URETEROSCOPY AND STENT PLACEMENT Left 11/05/2014  ? Procedure: [place], URETEROSCOPY AND STENT PLACEMENT;  Surgeon: Franchot Gallo, MD;  Location: [facility];  Service: Urology;  Laterality: Left;  ? CYSTOSCOPY/RETROGRADE/URETEROSCOPY/STONE EXTRACTION WITH BASKET Right 11/13/2013  ? Procedure: CYSTOSCOPY, RIGHT RETROGRADE PYLOGRAM WITH INTERPRETATION, RIGHT URETEROSCOPY WITH DILATION OF URETERAL STRICTURE WITH BASKET EXTRACTION OF STONE;  Surgeon: Ailene Rud, MD;  Location: [facility];  Service: Urology;  Laterality: Right;  ? FOOT SURGERY  2014  ? Right big toe  ? GYNECOLOGIC CRYOSURGERY  age 15  ? LAPAROSCOPIC TOTAL HYSTERECTOMY    ? MASTECTOMY Bilateral 01-2007  ? PARATHYROIDECTOMY N/A 07/07/2015  ? Procedure: PARATHYROIDECTOMY;  Surgeon: Armandina Gemma, MD;  Location: [facility];  Service: General;  Laterality: N/A;  ? PELVIC LAPAROSCOPY    ? LAP HYST BSO  ? STONE EXTRACTION WITH BASKET Left 11/05/2014  ? Procedure: STONE EXTRACTION WITH BASKET;  Surgeon: Franchot Gallo, MD;  Location: [facility];  Service: Urology;  Laterality: Left;  ? TONSILLECTOMY  1971  ? TOTAL ABDOMINAL HYSTERECTOMY W/ BILATERAL SALPINGOOPHORECTOMY  9/08  ? ? ? ?The following portions of the patient's history were reviewed and updated as appropriate: allergies, current medications, past family history, past medical history, past social history, past surgical history and problem list. ? ?ROS ?Otherwise as in subjective above ? ? ? ?Objective: ?BP 110/70   Pulse 69   Temp 98.8 ?F (37.1 ?C)   Wt 116 lb 12.8 oz (53 kg)   BMI 20.37 kg/m?  ? ?Wt Readings from Last 3 Encounters:  ?09/13/21 116 lb 12.8 oz (53 kg)  ?08/07/21 121 lb 6.4 oz (55.1 kg)  ?04/04/21 123 lb (55.8 kg)  ? ?General appearance: alert, no distress, well developed, well nourished ?Neck: supple, anterior cervical scar, no  lymphadenopathy, no thyromegaly, no masses ?Heart: RRR, normal S1, S2, no murmurs ?Lungs: CTA bilaterally, no wheezes, rhonchi, or rales ?Abdomen: +bs, soft, tender epigastric region and somewhat mid abdomen, otherwise tender, non distended, no masses, no hepatomegaly, no splenomegaly.  There is a prominent aortic pulsation but given her lean body habitus it may just be easily palpated. ?Pulses: 2+ radial pulses, 2+ pedal pulses, normal cap refill ?Ext: no edema ? ? ?Assessment: ?Encounter Diagnoses  ?Name Primary?  ? Thyroid disease Yes  ? Abdominal discomfort   ? Appetite loss   ? Loose stools   ? Heartburn   ? ? ? ?  Plan: ?We discussed her symptoms and concerns.  She does note that she has had a lot of stress going on lately.  She may have had some initial gastroenteritis but symptoms suggest possibly ulcer, versus GERD, H. pylori, versus other etiology ? ?Begin omeprazole, Pepto-Bismol twice daily.  Do the omeprazole for at least 2 weeks.  Avoid acidic or spicy foods, peppers and other GERD causing foods. ? ?Reviewing back she had a scan in 2021 that did not show any aortic aneurysm or bulge.  I doubt this is an issue but she does have a prominent pulsation of the abdominal exam but she is quite lean and is easy to palpate ? ?Labs as below.  I reviewed other labs she had recently at her physical in January 2023 here with Dr. Tomi Bamberger ? ?Cannie was seen today for stomach issues. ? ?Diagnoses and all orders for this visit: ? ?Thyroid disease ?-     TSH + free T4 ? ?Abdominal discomfort ?-     TSH + free T4 ? ?Appetite loss ?-     TSH + free T4 ? ?Loose stools ? ?Heartburn ? ?Other orders ?-     omeprazole (PRILOSEC) 40 MG capsule; Take 1 capsule (40 mg total) by mouth daily. ? ? ? ?Follow up: pending labs, f/u Dr. Tomi Bamberger in 2 weeks ?

## 2021-09-14 LAB — TSH+FREE T4
Free T4: 1.06 ng/dL (ref 0.82–1.77)
TSH: 1.91 u[IU]/mL (ref 0.450–4.500)

## 2021-09-19 DIAGNOSIS — M1711 Unilateral primary osteoarthritis, right knee: Secondary | ICD-10-CM | POA: Diagnosis not present

## 2021-09-22 ENCOUNTER — Encounter: Payer: Self-pay | Admitting: Family Medicine

## 2021-09-22 ENCOUNTER — Other Ambulatory Visit (INDEPENDENT_AMBULATORY_CARE_PROVIDER_SITE_OTHER): Payer: BC Managed Care – PPO

## 2021-09-22 ENCOUNTER — Other Ambulatory Visit: Payer: Self-pay

## 2021-09-22 DIAGNOSIS — Z23 Encounter for immunization: Secondary | ICD-10-CM | POA: Diagnosis not present

## 2021-09-25 ENCOUNTER — Other Ambulatory Visit: Payer: Self-pay

## 2021-09-25 ENCOUNTER — Ambulatory Visit (INDEPENDENT_AMBULATORY_CARE_PROVIDER_SITE_OTHER): Payer: BC Managed Care – PPO | Admitting: Family Medicine

## 2021-09-25 ENCOUNTER — Encounter: Payer: Self-pay | Admitting: Family Medicine

## 2021-09-25 VITALS — BP 92/60 | HR 64 | Ht 63.5 in | Wt 113.2 lb

## 2021-09-25 DIAGNOSIS — K219 Gastro-esophageal reflux disease without esophagitis: Secondary | ICD-10-CM | POA: Diagnosis not present

## 2021-09-25 DIAGNOSIS — R1013 Epigastric pain: Secondary | ICD-10-CM

## 2021-09-25 DIAGNOSIS — R634 Abnormal weight loss: Secondary | ICD-10-CM

## 2021-09-25 MED ORDER — OMEPRAZOLE 40 MG PO CPDR: 40.0000 mg | DELAYED_RELEASE_CAPSULE | Freq: Two times a day (BID) | ORAL | 0 refills | Status: AC

## 2021-09-25 NOTE — Progress Notes (Signed)
Chief Complaint  ?Patient presents with  ? Follow-up  ?  Follow up on weight loss and stomach. Has gotten a little better but still having issues. Had a piece of toast with vegan butter and her stomach started hurting right away.   ? ? ?Late January has issues with diarrhea, thought she had a stomach bug. Shortly after, her mother fell and fractured her pelvis, patient started not feeling well again. ?She developed diarrhea after every time she ate, with abdominal cramping. ?She then developed a lot of heartburn, and epigastric pain, which is why she scheduled to see Audelia Acton. ? ?She was prescribed omeprazole '40mg'$  once daily and a probiotic on 3/1. ?Patient showed me photo of bottle that she was given from pharmacy--given OTC omeprazole '20mg'$  (two bottles of #14) rather than dispensing '40mg'$  capsule as prescribed. ?So, she has only been taking '20mg'$  once daily. ?Diarrhea has stopped (is slightly constipated). ?She is still having some upper abdominal pain, some sharp pains under the ribcage She still gets some heartburn.  It isn't as bad as before, but still persists. ? ?She has decreased appetite, and afraid to eat because it causes pain. ?She cut out caffeine, alcohol, spicy, tomatoes, citrus. ?Denies nausea, dysphagia. ? ? ?PMH, PSH, SH reviewed ?Outpatient Encounter Medications as of 09/25/2021  ?Medication Sig Note  ? Acetylcysteine (NAC) 500 MG CAPS Take by mouth.   ? Ascorbic Acid (VITAMIN C) 1000 MG tablet Take 1,000 mg by mouth daily.   ? Bacillus Coagulans-Inulin (ALIGN PREBIOTIC-PROBIOTIC PO) Take 1 capsule by mouth daily.   ? buPROPion (WELLBUTRIN XL) 150 MG 24 hr tablet Take 2 tablets (300 mg total) by mouth daily.   ? cholecalciferol (VITAMIN D3) 25 MCG (1000 UNIT) tablet Take 1,000 Units by mouth daily.   ? fluticasone (FLONASE) 50 MCG/ACT nasal spray SHAKE LIQUID AND USE 2 SPRAYS IN EACH NOSTRIL DAILY   ? L-Methylfolate-Algae (DEPLIN 15) 15-90.314 MG CAPS TAKE 1 CAPSULE BY MOUTH EVERY DAY   ? loratadine  (CLARITIN) 10 MG tablet Take 10 mg by mouth daily.   ? Multiple Vitamins-Minerals (MULTIVITAMIN WITH MINERALS) tablet Take 1 tablet daily by mouth.   ? omeprazole (PRILOSEC) 40 MG capsule Take 1 capsule (40 mg total) by mouth daily.   ? polyethylene glycol (MIRALAX / GLYCOLAX) packet Take 17 g by mouth daily as needed. 08/03/2020: Uses daily  ? thyroid (ARMOUR) 60 MG tablet Take 60 mg by mouth daily before breakfast. 06/10/2019: Does not take on Wed or Sunday  ? traZODone (DESYREL) 100 MG tablet TAKE 2 TABLETS(200 MG) BY MOUTH AT BEDTIME (Patient taking differently: Take 100 mg by mouth at bedtime. TAKE 2 TABLETS(200 MG) BY MOUTH AT BEDTIME)   ? vitamin B-12 (CYANOCOBALAMIN) 100 MCG tablet Take 100 mcg by mouth daily. 08/07/2021: Twice a week  ? Zinc 22.5 MG TABS Take 1 tablet by mouth daily. 08/07/2021: Three times a week  ? ?No facility-administered encounter medications on file as of 09/25/2021.  ? ?Allergies  ?Allergen Reactions  ? Poison Ivy Extract Rash  ? Prednisone Palpitations  ? Morphine Itching  ?  REACTION: itching ?REACTION: itching  ? ? ?ROS: no fever, chills, no black or bloody stools.  Diarrhea resolved, occ slightly constipated. ?Decreased appetite. No nausea or vomiting. No dysphagia. ?HA and fatigue from shingrix on Friday. ?+stress ? ?PHYSICAL EXAM: ? ?BP 92/60   Pulse 64   Ht 5' 3.5" (1.613 m)   Wt 113 lb 3.2 oz (51.3 kg)   BMI 19.74 kg/m?  ? ?  Wt Readings from Last 3 Encounters:  ?09/25/21 113 lb 3.2 oz (51.3 kg)  ?09/13/21 116 lb 12.8 oz (53 kg)  ?08/07/21 121 lb 6.4 oz (55.1 kg)  ? ?Thin, pleasant female in no distress ?HEENT: conjunctiva and sclera are clear, EOMI, wearing mask ?Neck: no lymphadenopathy, thyromegaly or mass ?Heart: regular rate and rhythm ?Lungs: clear bilaterally ?Abdomen: Soft. Tender in epigastrium, extending slightly to LUQ. ?Focal area of tendernes in LLQ, no rebound or guarding, no hernia ?No cervical or inguinal lymphadenopathy ?Extremities: no edema ?Psych: appears  somewhat tired/stressed, thin.  Overall has full range of affect.  Normal hygiene and grooming, eye contact speech ?Neuro: alert and oriented, normal gait, strength ? ? ?ASSESSMENT/PLAN: ? ?Discussed DDx of her symptoms. ?Reassuring that her diarrhea has stopped, though hasn't regained weight. ?Has slight LLQ tenderness today. ?Has ongoing epigastric pain and GERD symptoms (vs PUD), but only on suboptimal doses of PPI due to pharmacy error. ? ?Will increase to '40mg'$  once daily for a few days, and if not completely resolving sx, can increase to BID, taken AC breakfast/dinner. ?Putting referral in for [facility], prev saw Dr. [name]) so that she can be seen within 2 weeks if symptoms not improving (and can cancel if sx resolve, appetite recurs, and she is gaining weight). ? ?Discussed H pylori in differential, and that best testing is breath test or stool Ag.  ?Since we are checking labs today, will check Ab's, but discussed the limitations of this test. ?She understands this. ?If sx not improving, and she sees GI, suspect she will need EGD, and they can test for H pylori via bx/staining. ? ?All question were answered. ?Diarrhea has resolved. She is eating lots of foods with gluten (they are bland and she can tolerate), so celiac panel not being checked. ? ? ? ?

## 2021-09-25 NOTE — Patient Instructions (Signed)
Since you have only been getting '20mg'$  daily, start by taking '40mg'$  daily.  I'm re-sending the prescription (they gave you the wrong strength medication, not the '40mg'$  that was prescribed), with directions to take it twice daily.  Start it once daily before breakfast, and if not improving, you can double up and take it twice daily before meals, and stay at the twice daily dose until either better or until you see GI. ?If you cannot tolerate this or it doesn't work, we can see if we can get you Dexilant, which sometimes can be more effective. ? ?We are repeating some labs today--chem panel and blood counts. We are also checking antibodies for H Pylori (though this admittedly isn't the best way to test for infection; breath test or stool antigens are often used, but since we are doing other labs, we will see what it shows). ? ?We will refer you to GI for further evaluation.  Best to wait 1-2 weeks and cancel if your symptoms completely resolve. ?

## 2021-09-26 LAB — COMPREHENSIVE METABOLIC PANEL
ALT: 12 IU/L (ref 0–32)
AST: 12 IU/L (ref 0–40)
Albumin/Globulin Ratio: 2.1 (ref 1.2–2.2)
Albumin: 4.6 g/dL (ref 3.8–4.9)
Alkaline Phosphatase: 67 IU/L (ref 44–121)
BUN/Creatinine Ratio: 27 — ABNORMAL HIGH (ref 9–23)
BUN: 17 mg/dL (ref 6–24)
Bilirubin Total: <0.2 mg/dL (ref 0.0–1.2)
CO2: 25 mmol/L (ref 20–29)
Calcium: 9.6 mg/dL (ref 8.7–10.2)
Chloride: 101 mmol/L (ref 96–106)
Creatinine, Ser: 0.62 mg/dL (ref 0.57–1.00)
Globulin, Total: 2.2 g/dL (ref 1.5–4.5)
Glucose: 105 mg/dL — ABNORMAL HIGH (ref 70–99)
Potassium: 4.1 mmol/L (ref 3.5–5.2)
Sodium: 141 mmol/L (ref 134–144)
Total Protein: 6.8 g/dL (ref 6.0–8.5)
eGFR: 103 mL/min/{1.73_m2} (ref >59)

## 2021-09-26 LAB — CBC WITH DIFFERENTIAL/PLATELET
Basophils Absolute: 0 10*3/uL (ref 0.0–0.2)
Basos: 0 %
EOS (ABSOLUTE): 0.1 10*3/uL (ref 0.0–0.4)
Eos: 1 %
Hematocrit: 40.2 % (ref 34.0–46.6)
Hemoglobin: 13.8 g/dL (ref 11.1–15.9)
Immature Grans (Abs): 0 10*3/uL (ref 0.0–0.1)
Immature Granulocytes: 0 %
Lymphocytes Absolute: 1.5 10*3/uL (ref 0.7–3.1)
Lymphs: 35 %
MCH: 31.5 pg (ref 26.6–33.0)
MCHC: 34.3 g/dL (ref 31.5–35.7)
MCV: 92 fL (ref 79–97)
Monocytes Absolute: 0.5 10*3/uL (ref 0.1–0.9)
Monocytes: 12 %
Neutrophils Absolute: 2.3 10*3/uL (ref 1.4–7.0)
Neutrophils: 52 %
Platelets: 198 10*3/uL (ref 150–450)
RBC: 4.38 x10E6/uL (ref 3.77–5.28)
RDW: 12.6 % (ref 11.7–15.4)
WBC: 4.3 10*3/uL (ref 3.4–10.8)

## 2021-09-26 LAB — H PYLORI, IGM, IGG, IGA AB
H pylori, IgM Abs: <9 units (ref 0.0–8.9)
H. pylori, IgA Abs: <9 units (ref 0.0–8.9)
H. pylori, IgG AbS: 0.09 Index Value (ref 0.00–0.79)

## 2021-09-27 DIAGNOSIS — M79644 Pain in right finger(s): Secondary | ICD-10-CM | POA: Diagnosis not present

## 2021-09-27 DIAGNOSIS — M1811 Unilateral primary osteoarthritis of first carpometacarpal joint, right hand: Secondary | ICD-10-CM | POA: Diagnosis not present

## 2021-10-06 ENCOUNTER — Other Ambulatory Visit: Payer: Self-pay | Admitting: Gastroenterology

## 2021-10-06 DIAGNOSIS — K5900 Constipation, unspecified: Secondary | ICD-10-CM | POA: Diagnosis not present

## 2021-10-06 DIAGNOSIS — R634 Abnormal weight loss: Secondary | ICD-10-CM | POA: Diagnosis not present

## 2021-10-06 DIAGNOSIS — Z86010 Personal history of colonic polyps: Secondary | ICD-10-CM | POA: Diagnosis not present

## 2021-10-06 DIAGNOSIS — R1013 Epigastric pain: Secondary | ICD-10-CM | POA: Diagnosis not present

## 2021-10-16 ENCOUNTER — Ambulatory Visit
Admission: RE | Admit: 2021-10-16 | Discharge: 2021-10-16 | Disposition: A | Discharge status: Home / Self Care | Payer: BC Managed Care – PPO | Source: Ambulatory Visit | Attending: Gastroenterology | Admitting: Gastroenterology

## 2021-10-16 DIAGNOSIS — R1013 Epigastric pain: Secondary | ICD-10-CM

## 2021-10-22 ENCOUNTER — Other Ambulatory Visit: Payer: Self-pay | Admitting: Psychiatry

## 2021-10-22 DIAGNOSIS — F4001 Agoraphobia with panic disorder: Secondary | ICD-10-CM

## 2021-10-22 DIAGNOSIS — F411 Generalized anxiety disorder: Secondary | ICD-10-CM

## 2021-10-22 DIAGNOSIS — F5105 Insomnia due to other mental disorder: Secondary | ICD-10-CM

## 2021-10-22 DIAGNOSIS — F331 Major depressive disorder, recurrent, moderate: Secondary | ICD-10-CM

## 2021-10-24 DIAGNOSIS — K2930 Chronic superficial gastritis without bleeding: Secondary | ICD-10-CM | POA: Diagnosis not present

## 2021-10-24 DIAGNOSIS — R1013 Epigastric pain: Secondary | ICD-10-CM | POA: Diagnosis not present

## 2021-10-24 DIAGNOSIS — R140 Abdominal distension (gaseous): Secondary | ICD-10-CM | POA: Diagnosis not present

## 2021-10-24 DIAGNOSIS — R634 Abnormal weight loss: Secondary | ICD-10-CM | POA: Diagnosis not present

## 2021-10-24 DIAGNOSIS — K449 Diaphragmatic hernia without obstruction or gangrene: Secondary | ICD-10-CM | POA: Diagnosis not present

## 2021-10-24 HISTORY — PX: ESOPHAGOGASTRODUODENOSCOPY: SHX1529

## 2021-11-02 DIAGNOSIS — H5213 Myopia, bilateral: Secondary | ICD-10-CM | POA: Diagnosis not present

## 2021-11-02 DIAGNOSIS — H0100B Unspecified blepharitis left eye, upper and lower eyelids: Secondary | ICD-10-CM | POA: Diagnosis not present

## 2021-11-02 DIAGNOSIS — H0100A Unspecified blepharitis right eye, upper and lower eyelids: Secondary | ICD-10-CM | POA: Diagnosis not present

## 2021-11-02 DIAGNOSIS — H16223 Keratoconjunctivitis sicca, not specified as Sjogren's, bilateral: Secondary | ICD-10-CM | POA: Diagnosis not present

## 2021-11-08 ENCOUNTER — Other Ambulatory Visit (HOSPITAL_COMMUNITY): Payer: Self-pay | Admitting: Gastroenterology

## 2021-11-08 ENCOUNTER — Other Ambulatory Visit: Payer: Self-pay | Admitting: Gastroenterology

## 2021-11-08 DIAGNOSIS — R1013 Epigastric pain: Secondary | ICD-10-CM | POA: Diagnosis not present

## 2021-11-08 DIAGNOSIS — K219 Gastro-esophageal reflux disease without esophagitis: Secondary | ICD-10-CM | POA: Diagnosis not present

## 2021-11-16 ENCOUNTER — Encounter: Payer: Self-pay | Admitting: *Deleted

## 2021-11-17 ENCOUNTER — Encounter: Payer: Self-pay | Admitting: Family Medicine

## 2021-11-20 ENCOUNTER — Ambulatory Visit (HOSPITAL_COMMUNITY)
Admission: RE | Admit: 2021-11-20 | Discharge: 2021-11-20 | Disposition: A | Discharge status: Home / Self Care | Payer: BC Managed Care – PPO | Source: Ambulatory Visit | Attending: Gastroenterology | Admitting: Gastroenterology

## 2021-11-20 DIAGNOSIS — R1013 Epigastric pain: Secondary | ICD-10-CM | POA: Insufficient documentation

## 2021-11-20 MED ORDER — TECHNETIUM TC 99M MEBROFENIN IV KIT
5.4000 | PACK | Freq: Once | INTRAVENOUS | Status: AC | PRN
  Administered 2021-11-20: 5.4 via INTRAVENOUS

## 2021-11-27 DIAGNOSIS — Z86010 Personal history of colonic polyps: Secondary | ICD-10-CM | POA: Diagnosis not present

## 2021-11-27 DIAGNOSIS — K644 Residual hemorrhoidal skin tags: Secondary | ICD-10-CM | POA: Diagnosis not present

## 2021-11-27 DIAGNOSIS — K5730 Diverticulosis of large intestine without perforation or abscess without bleeding: Secondary | ICD-10-CM | POA: Diagnosis not present

## 2021-11-27 DIAGNOSIS — K635 Polyp of colon: Secondary | ICD-10-CM | POA: Diagnosis not present

## 2021-11-27 LAB — HM COLONOSCOPY

## 2021-11-30 DIAGNOSIS — H16223 Keratoconjunctivitis sicca, not specified as Sjogren's, bilateral: Secondary | ICD-10-CM | POA: Diagnosis not present

## 2021-12-19 ENCOUNTER — Other Ambulatory Visit: Payer: Self-pay | Admitting: Gastroenterology

## 2021-12-19 DIAGNOSIS — R1013 Epigastric pain: Secondary | ICD-10-CM

## 2021-12-19 DIAGNOSIS — K219 Gastro-esophageal reflux disease without esophagitis: Secondary | ICD-10-CM | POA: Diagnosis not present

## 2022-01-12 ENCOUNTER — Other Ambulatory Visit: Payer: BC Managed Care – PPO

## 2022-01-17 DIAGNOSIS — N760 Acute vaginitis: Secondary | ICD-10-CM | POA: Diagnosis not present

## 2022-01-17 DIAGNOSIS — R319 Hematuria, unspecified: Secondary | ICD-10-CM | POA: Diagnosis not present

## 2022-01-17 DIAGNOSIS — R300 Dysuria: Secondary | ICD-10-CM | POA: Diagnosis not present

## 2022-01-18 ENCOUNTER — Ambulatory Visit
Admission: RE | Admit: 2022-01-18 | Discharge: 2022-01-18 | Disposition: A | Discharge status: Home / Self Care | Payer: BC Managed Care – PPO | Source: Ambulatory Visit | Attending: Gastroenterology | Admitting: Gastroenterology

## 2022-01-18 DIAGNOSIS — K760 Fatty (change of) liver, not elsewhere classified: Secondary | ICD-10-CM | POA: Diagnosis not present

## 2022-01-18 DIAGNOSIS — R1013 Epigastric pain: Secondary | ICD-10-CM

## 2022-01-18 DIAGNOSIS — R634 Abnormal weight loss: Secondary | ICD-10-CM | POA: Diagnosis not present

## 2022-01-18 DIAGNOSIS — Z90710 Acquired absence of both cervix and uterus: Secondary | ICD-10-CM | POA: Diagnosis not present

## 2022-01-18 DIAGNOSIS — N200 Calculus of kidney: Secondary | ICD-10-CM | POA: Diagnosis not present

## 2022-01-18 MED ORDER — IOPAMIDOL (ISOVUE-300) INJECTION 61%
100.0000 mL | Freq: Once | INTRAVENOUS | Status: AC | PRN
  Administered 2022-01-18: 100 mL via INTRAVENOUS

## 2022-02-23 ENCOUNTER — Other Ambulatory Visit (INDEPENDENT_AMBULATORY_CARE_PROVIDER_SITE_OTHER): Payer: BC Managed Care – PPO

## 2022-02-23 DIAGNOSIS — Z23 Encounter for immunization: Secondary | ICD-10-CM | POA: Diagnosis not present

## 2022-02-27 DIAGNOSIS — E039 Hypothyroidism, unspecified: Secondary | ICD-10-CM | POA: Diagnosis not present

## 2022-02-27 DIAGNOSIS — E210 Primary hyperparathyroidism: Secondary | ICD-10-CM | POA: Diagnosis not present

## 2022-02-27 DIAGNOSIS — R634 Abnormal weight loss: Secondary | ICD-10-CM | POA: Diagnosis not present

## 2022-02-27 DIAGNOSIS — R5383 Other fatigue: Secondary | ICD-10-CM | POA: Diagnosis not present

## 2022-03-21 ENCOUNTER — Encounter: Payer: Self-pay | Admitting: Internal Medicine

## 2022-03-23 DIAGNOSIS — K760 Fatty (change of) liver, not elsewhere classified: Secondary | ICD-10-CM | POA: Diagnosis not present

## 2022-03-23 DIAGNOSIS — R1084 Generalized abdominal pain: Secondary | ICD-10-CM | POA: Diagnosis not present

## 2022-04-09 DIAGNOSIS — M25532 Pain in left wrist: Secondary | ICD-10-CM | POA: Diagnosis not present

## 2022-04-11 DIAGNOSIS — Z1272 Encounter for screening for malignant neoplasm of vagina: Secondary | ICD-10-CM | POA: Diagnosis not present

## 2022-04-11 DIAGNOSIS — Z681 Body mass index (BMI) 19 or less, adult: Secondary | ICD-10-CM | POA: Diagnosis not present

## 2022-04-11 DIAGNOSIS — Z01419 Encounter for gynecological examination (general) (routine) without abnormal findings: Secondary | ICD-10-CM | POA: Diagnosis not present

## 2022-04-23 DIAGNOSIS — M25532 Pain in left wrist: Secondary | ICD-10-CM | POA: Diagnosis not present

## 2022-04-24 ENCOUNTER — Encounter: Payer: Self-pay | Admitting: Internal Medicine

## 2022-04-26 DIAGNOSIS — E210 Primary hyperparathyroidism: Secondary | ICD-10-CM | POA: Diagnosis not present

## 2022-04-26 DIAGNOSIS — M8580 Other specified disorders of bone density and structure, unspecified site: Secondary | ICD-10-CM | POA: Diagnosis not present

## 2022-04-26 DIAGNOSIS — E039 Hypothyroidism, unspecified: Secondary | ICD-10-CM | POA: Diagnosis not present

## 2022-04-28 DIAGNOSIS — M25532 Pain in left wrist: Secondary | ICD-10-CM | POA: Diagnosis not present

## 2022-05-01 DIAGNOSIS — S62009A Unspecified fracture of navicular [scaphoid] bone of unspecified wrist, initial encounter for closed fracture: Secondary | ICD-10-CM

## 2022-05-01 DIAGNOSIS — M25532 Pain in left wrist: Secondary | ICD-10-CM | POA: Diagnosis not present

## 2022-05-01 HISTORY — DX: Unspecified fracture of navicular (scaphoid) bone of unspecified wrist, initial encounter for closed fracture: S62.009A

## 2022-05-02 ENCOUNTER — Encounter: Payer: Self-pay | Admitting: *Deleted

## 2022-05-03 ENCOUNTER — Encounter: Payer: Self-pay | Admitting: Family Medicine

## 2022-05-03 DIAGNOSIS — E210 Primary hyperparathyroidism: Secondary | ICD-10-CM | POA: Diagnosis not present

## 2022-05-03 DIAGNOSIS — M8580 Other specified disorders of bone density and structure, unspecified site: Secondary | ICD-10-CM | POA: Diagnosis not present

## 2022-05-03 DIAGNOSIS — Z23 Encounter for immunization: Secondary | ICD-10-CM | POA: Diagnosis not present

## 2022-05-03 DIAGNOSIS — N200 Calculus of kidney: Secondary | ICD-10-CM | POA: Diagnosis not present

## 2022-05-03 DIAGNOSIS — E039 Hypothyroidism, unspecified: Secondary | ICD-10-CM | POA: Diagnosis not present

## 2022-05-04 DIAGNOSIS — M25532 Pain in left wrist: Secondary | ICD-10-CM | POA: Diagnosis not present

## 2022-05-06 NOTE — Progress Notes (Addendum)
Chief Complaint  Patient presents with   Follow-up    Fasting follow up, wants to discuss cardiac scoring.    Patient presents to discuss getting a coronary calcium score. She feels like her diet is excellent, but is concerned about her cholesterol and family history of heart disease. Current diet--she doesn't eat any meat, eats fish.  Cut back on dairy, very little cheese, uses Lactaid or oat milk. Snacks on nuts. Last lipids: Lab Results  Component Value Date   CHOL 261 (H) 08/07/2021   HDL 90 08/07/2021   LDLCALC 162 (H) 08/07/2021   TRIG 60 08/07/2021   CHOLHDL 2.9 08/07/2021  Prior LDL was 147 in 07/2020; LDL was 116 in 09/2011.  The 10-year ASCVD risk score (Arnett DK, et al., 2019) is: 1.7%   Values used to calculate the score:     Age: 61 years     Sex: Female     Is Non-Hispanic African American: No     Diabetic: No     Tobacco smoker: No     Systolic Blood Pressure: 98 mmHg     Is BP treated: No     HDL Cholesterol: 90 mg/dL     Total Cholesterol: 261 mg/dL  Family history of heart disease reviewed: Mother--afib and pacemaker at 41 PGF died of MI at age 28 MGM with CAD, MI around 66.  She is under the care of Dr. Chalmers Cater for hypothyroidism.  She had labs drawn on 10/12 TSH 5.02  Her Armour thyroid dose was increased from 60 mg 5 days/week to taking it 6 days/week (skips Saturday).   She didn't set up a f/u TSH, just said that if she started losing weight again, to contact her. She has a hiatal hernia, and has been dealing with a lot of GI issues. Stomach is finally improving--using Gas-X and IBGard with good results. Abdominal pain is improving, has some intermittent diarrhea. She had CT abdomen in July (to evaluate pain and wt loss)--nothing to explain symptoms. Nonobstructive L renal calculus and mild hepatic steatosis noted. 11/2021 had nuclear med study--patent cystic and common bile ducts, normal GB EF. She had normal abdominal US in 10/2021.   PMH, PSH, SH  reviewed  Outpatient Encounter Medications as of 05/07/2022  Medication Sig Note   Acetylcysteine (NAC) 500 MG CAPS Take by mouth.    Bacillus Coagulans-Inulin (ALIGN PREBIOTIC-PROBIOTIC PO) Take 1 capsule by mouth daily.    buPROPion (WELLBUTRIN XL) 150 MG 24 hr tablet Take 2 tablets (300 mg total) by mouth daily.    Cyanocobalamin (VITAMIN B 12 PO) Take 1,000 mcg by mouth daily. 05/07/2022: With Biotin   fluticasone (FLONASE) 50 MCG/ACT nasal spray SHAKE LIQUID AND USE 2 SPRAYS IN EACH NOSTRIL DAILY    L-Methylfolate-Algae (DEPLIN 15) 15-90.314 MG CAPS TAKE 1 CAPSULE BY MOUTH EVERY DAY    loratadine (CLARITIN) 10 MG tablet Take 10 mg by mouth daily.    Multiple Vitamins-Minerals (MULTIVITAMIN WITH MINERALS) tablet Take 1 tablet daily by mouth.    polyethylene glycol (MIRALAX / GLYCOLAX) packet Take 17 g by mouth daily as needed. 08/03/2020: Uses daily   thyroid (ARMOUR) 60 MG tablet Take 60 mg by mouth daily before breakfast. 05/07/2022: Does not Sat   traZODone (DESYREL) 100 MG tablet TAKE 2 TABLETS(200 MG) BY MOUTH AT BEDTIME    Zinc 22.5 MG TABS Take 1 tablet by mouth daily. 05/07/2022: Takes when she feels like she is getting sick   Ascorbic Acid (VITAMIN C) 1000 MG  tablet Take 1,000 mg by mouth daily. (Patient not taking: Reported on 05/07/2022)    cholecalciferol (VITAMIN D3) 25 MCG (1000 UNIT) tablet Take 1,000 Units by mouth daily. (Patient not taking: Reported on 05/07/2022)    omeprazole (PRILOSEC) 40 MG capsule Take 1 capsule (40 mg total) by mouth 2 (two) times daily before a meal. (Patient not taking: Reported on 05/07/2022) 05/07/2022: prn   [DISCONTINUED] vitamin B-12 (CYANOCOBALAMIN) 100 MCG tablet Take 100 mcg by mouth daily.    No facility-administered encounter medications on file as of 05/07/2022.   Allergies  Allergen Reactions   Poison Ivy Extract Rash   Prednisone Palpitations   Morphine Itching    REACTION: itching REACTION: itching    ROS:  no fever, chills,  URI symptoms, headaches, dizziness, chest pain, shortness of breath, edema. No n/v.  Abdominal pain improving; some diarrhea the other day.  IBGard and Gas-X prn are helpful Moods are good, +stress Weight loss due to GI issues, since January.  No further loss in 2 months Fractured L wrist/scaphoid while biking in [place]--improving.   PHYSICAL EXAM:  BP 98/60   Pulse 60   Ht 5' 3.5" (1.613 m)   Wt 106 lb 9.6 oz (48.4 kg)   BMI 18.59 kg/m   Wt Readings from Last 3 Encounters:  05/07/22 106 lb 9.6 oz (48.4 kg)  09/25/21 113 lb 3.2 oz (51.3 kg)  09/13/21 116 lb 12.8 oz (53 kg)   Very pleasant, thin female, in good spirits. Neuro: alert and oriented, grossly normal cranial nerves, normal gait. EOMI. Wearing L wrist brace.    ASSESSMENT/PLAN:  Hypercholesteremia - reviewed low cholesterol diet in detail.  She has excellent HDL and chol/HDL ratio. No atherosclerosis noted on recent imaging. - Plan: Lipid panel, CT CARDIAC SCORING (SELF PAY ONLY)  Family history of premature CAD - pt desires coronary calcium score/scan, to see if statins are needed due to her lipids and FH. Low ASCVD risk - Plan: CT CARDIAC SCORING (SELF PAY ONLY)  Hypothyroidism, unspecified type - discussed how this could influence her lipid panel. Recheck lipid along with TSH in 5-6 wks due to recent dose adjustment - Plan: TSH  Vaccine counseling - recommended COVID booster (should wait since just got flu shot); RSV discussed, to check insurance.   Looks like she got flu shot at Dr. Almetta Lovely on 10/19? Enter See if she had COVID booster (too soon to give) RSV also rec if insurance covers

## 2022-05-07 ENCOUNTER — Encounter: Payer: Self-pay | Admitting: Family Medicine

## 2022-05-07 ENCOUNTER — Ambulatory Visit (INDEPENDENT_AMBULATORY_CARE_PROVIDER_SITE_OTHER): Payer: BC Managed Care – PPO | Admitting: Family Medicine

## 2022-05-07 VITALS — BP 98/60 | HR 60 | Ht 63.5 in | Wt 106.6 lb

## 2022-05-07 DIAGNOSIS — E7800 Pure hypercholesterolemia, unspecified: Secondary | ICD-10-CM

## 2022-05-07 DIAGNOSIS — Z7185 Encounter for immunization safety counseling: Secondary | ICD-10-CM

## 2022-05-07 DIAGNOSIS — Z8249 Family history of ischemic heart disease and other diseases of the circulatory system: Secondary | ICD-10-CM | POA: Diagnosis not present

## 2022-05-07 DIAGNOSIS — E039 Hypothyroidism, unspecified: Secondary | ICD-10-CM | POA: Diagnosis not present

## 2022-05-07 NOTE — Patient Instructions (Addendum)
I recommend COVID booster and the new RSV vaccine. These should be separated from other vaccines by 2 weeks. You should check with your insurance before getting the RSV vaccine (it is new, ensure that it is covered).  We briefly discussed amitriptylene for abdominal pain from IBS.  You can speak with your GI about it if the pain gets worse. (It is a preventative med taken every night).

## 2022-05-17 ENCOUNTER — Ambulatory Visit (HOSPITAL_COMMUNITY)
Admission: RE | Admit: 2022-05-17 | Discharge: 2022-05-17 | Disposition: A | Discharge status: Home / Self Care | Payer: BC Managed Care – PPO | Source: Ambulatory Visit | Attending: Family Medicine | Admitting: Family Medicine

## 2022-05-17 DIAGNOSIS — Z8249 Family history of ischemic heart disease and other diseases of the circulatory system: Secondary | ICD-10-CM

## 2022-05-17 DIAGNOSIS — E7800 Pure hypercholesterolemia, unspecified: Secondary | ICD-10-CM

## 2022-05-17 NOTE — Progress Notes (Incomplete)
Mother has hx of afib. Grandparents have hx of early hear disease. Non smoker.   No chest c/o. Hypercholesteremia.   Dr. Margaretann Loveless to read, [facility] to over-read  Ethnicity: Caucasian

## 2022-05-25 DIAGNOSIS — S62025D Nondisplaced fracture of middle third of navicular [scaphoid] bone of left wrist, subsequent encounter for fracture with routine healing: Secondary | ICD-10-CM | POA: Diagnosis not present

## 2022-05-25 DIAGNOSIS — M13842 Other specified arthritis, left hand: Secondary | ICD-10-CM | POA: Diagnosis not present

## 2022-05-25 DIAGNOSIS — M25532 Pain in left wrist: Secondary | ICD-10-CM | POA: Diagnosis not present

## 2022-06-01 DIAGNOSIS — R350 Frequency of micturition: Secondary | ICD-10-CM | POA: Diagnosis not present

## 2022-06-01 DIAGNOSIS — R300 Dysuria: Secondary | ICD-10-CM | POA: Diagnosis not present

## 2022-06-11 ENCOUNTER — Other Ambulatory Visit: Payer: BC Managed Care – PPO

## 2022-06-21 ENCOUNTER — Other Ambulatory Visit: Payer: Self-pay | Admitting: Psychiatry

## 2022-06-22 DIAGNOSIS — S62025D Nondisplaced fracture of middle third of navicular [scaphoid] bone of left wrist, subsequent encounter for fracture with routine healing: Secondary | ICD-10-CM | POA: Diagnosis not present

## 2022-06-22 DIAGNOSIS — M13842 Other specified arthritis, left hand: Secondary | ICD-10-CM | POA: Diagnosis not present

## 2022-06-22 DIAGNOSIS — M25532 Pain in left wrist: Secondary | ICD-10-CM | POA: Diagnosis not present

## 2022-06-22 NOTE — Telephone Encounter (Signed)
Ok to send

## 2022-06-24 DIAGNOSIS — N3001 Acute cystitis with hematuria: Secondary | ICD-10-CM | POA: Diagnosis not present

## 2022-06-24 DIAGNOSIS — R300 Dysuria: Secondary | ICD-10-CM | POA: Diagnosis not present

## 2022-06-24 DIAGNOSIS — R81 Glycosuria: Secondary | ICD-10-CM | POA: Diagnosis not present

## 2022-06-25 ENCOUNTER — Encounter: Payer: Self-pay | Admitting: Family Medicine

## 2022-07-30 ENCOUNTER — Ambulatory Visit (INDEPENDENT_AMBULATORY_CARE_PROVIDER_SITE_OTHER): Payer: BC Managed Care – PPO | Admitting: Psychiatry

## 2022-07-30 ENCOUNTER — Encounter: Payer: Self-pay | Admitting: Psychiatry

## 2022-07-30 DIAGNOSIS — F330 Major depressive disorder, recurrent, mild: Secondary | ICD-10-CM | POA: Diagnosis not present

## 2022-07-30 DIAGNOSIS — F5105 Insomnia due to other mental disorder: Secondary | ICD-10-CM

## 2022-07-30 DIAGNOSIS — F331 Major depressive disorder, recurrent, moderate: Secondary | ICD-10-CM

## 2022-07-30 DIAGNOSIS — F411 Generalized anxiety disorder: Secondary | ICD-10-CM | POA: Diagnosis not present

## 2022-07-30 DIAGNOSIS — F4001 Agoraphobia with panic disorder: Secondary | ICD-10-CM | POA: Diagnosis not present

## 2022-07-30 DIAGNOSIS — F900 Attention-deficit hyperactivity disorder, predominantly inattentive type: Secondary | ICD-10-CM

## 2022-07-30 MED ORDER — TRAZODONE HCL 50 MG PO TABS: 50.0000 mg | ORAL_TABLET | Freq: Every day | ORAL | 3 refills | Status: DC

## 2022-07-30 MED ORDER — BUPROPION HCL ER (XL) 300 MG PO TB24: 300.0000 mg | ORAL_TABLET | Freq: Every day | ORAL | 3 refills | Status: DC

## 2022-07-30 NOTE — Progress Notes (Signed)
Kristen Gamble [id number] 1960-11-01 62 y.o.  Subjective:   Patient ID:  Kristen Gamble is a 62 y.o. (DOB 15-Jul-1961) female.  Chief Complaint:  Chief Complaint  Patient presents with   Follow-up   Anxiety   Depression    Anxiety Patient reports no confusion, decreased concentration, nervous/anxious behavior, palpitations or suicidal ideas.    Depression        Associated symptoms include no decreased concentration and no suicidal ideas.  Past medical history includes anxiety.    last Kristen Gamble presents to the office today for follow-up of depression.  07/2020 appt:  seen  yearly..  No meds were changed.  She remained on gabapentin 300 nightly, Deplin 15, trazodone 200 nightly reduced to 100 mg HS.Kristen Gamble doing really well without problems.  Exercise and Alanon helps. Son in recovery. Is having more ADHD sx and ran out of NAC a couple of weeks ago.   Depression is better with med changes and therapy and support group.  Depression is managed.  Exercise helps anxiety. Couple panic with triggers but not severe.  Patient reports stable mood and denies depressed or irritable moods.  Patient reports episodic difficulty with anxiety.  Patient denies difficulty with sleep initiation or maintenance. Denies appetite disturbance.  Patient reports that energy and motivation have been good.  Patient denies any difficulty with concentration.  Patient denies any suicidal ideation.  Sleep good. Exercise a lot with [place] and loves it.  No alcohol. Seeing [name] in therapy monthly and it's helpful.  Also attending [facility].  Father was also an alcoholic.  07/27/2021 appt noted: Added back NAC and it helps. Clearly. Depression occ including part of stress work. Wants to try weaning off gabapentin bc no longer needed for neuropathy. Feels flat a lot of time but can enjoy things. 2 grandkids. Oldest D med school 3 rd year and engaged. Plan: Reduce gabapentin to 2 of the 100 mg capsules at night for 2  weeks, then reduce to 1 capsule at night for 2 weeks, then stop gabapentin Start bupropion or Wellbutrin 1 in the morning for 1 week, then 2 in the morning which equals 300 mg. Increase NAC to 2 of the 600 mg capsules each morning.  07/30/22 appt noted: Doing good.   On Wellbutrin XL 300 mg and reduced trazodone to 50 mg HS for 6 mos. Sleep ok with less trazodone. No SE. Mood is good.  Better with Wellbutrin.  Anxiety is manageable and better than in the past.. 2 D and son.  Oldest D grad from med school.    Gabapentin for neuropathy.  Prior psychiatric medications include methylphenidate,  mirtazapine 45 mg, trazodone,  Deplin, N-acetylcysteine.   She last took stimulants December 2018.  Son panic  Review of Systems:  Review of Systems  Cardiovascular:  Negative for palpitations.  Neurological:  Negative for tremors.       Neuropathy better.  Psychiatric/Behavioral:  Negative for agitation, behavioral problems, confusion, decreased concentration, dysphoric mood, hallucinations, self-injury, sleep disturbance and suicidal ideas. The patient is not nervous/anxious and is not hyperactive.     Medications: I have reviewed the patient's current medications.  Current Outpatient Medications  Medication Sig Dispense Refill   Acetylcysteine (NAC) 500 MG CAPS Take by mouth.     Bacillus Coagulans-Inulin (ALIGN PREBIOTIC-PROBIOTIC PO) Take 1 capsule by mouth daily.     Cyanocobalamin (VITAMIN B 12 PO) Take 1,000 mcg by mouth daily.     fluticasone (FLONASE) 50 MCG/ACT nasal spray SHAKE  LIQUID AND USE 2 SPRAYS IN EACH NOSTRIL DAILY 16 g 2   L-Methylfolate-Algae (DEPLIN 15) 15-90.314 MG CAPS TAKE 1 CAPSULE BY MOUTH EVERY DAY 90 capsule 3   loratadine (CLARITIN) 10 MG tablet Take 10 mg by mouth daily.     Multiple Vitamins-Minerals (MULTIVITAMIN WITH MINERALS) tablet Take 1 tablet daily by mouth.     polyethylene glycol (MIRALAX / GLYCOLAX) packet Take 17 g by mouth daily as needed.      thyroid (ARMOUR) 60 MG tablet Take 60 mg by mouth daily before breakfast.     Zinc 22.5 MG TABS Take 1 tablet by mouth daily.     Ascorbic Acid (VITAMIN C) 1000 MG tablet Take 1,000 mg by mouth daily. (Patient not taking: Reported on 05/07/2022)     buPROPion (WELLBUTRIN XL) 300 MG 24 hr tablet Take 1 tablet (300 mg total) by mouth daily. 90 tablet 3   cholecalciferol (VITAMIN D3) 25 MCG (1000 UNIT) tablet Take 1,000 Units by mouth daily. (Patient not taking: Reported on 05/07/2022)     omeprazole (PRILOSEC) 40 MG capsule Take 1 capsule (40 mg total) by mouth 2 (two) times daily before a meal. (Patient not taking: Reported on 05/07/2022) 60 capsule 0   traZODone (DESYREL) 50 MG tablet Take 1 tablet (50 mg total) by mouth at bedtime. 90 tablet 3   No current facility-administered medications for this visit.    Medication Side Effects: None  Allergies:  Allergies  Allergen Reactions   Poison Ivy Extract Rash   Prednisone Palpitations   Morphine Itching    REACTION: itching REACTION: itching    Past Medical History:  Diagnosis Date   ADHD (attention deficit hyperactivity disorder) 08/2011   Arthritis    BCC (basal cell carcinoma of skin) 05/2020   chest, Dr. Martinique   Breast cancer [facility]) 33/2951   left   Complication of anesthesia    nausea   Constipation    COVID-19 virus infection 01/2021   Family history of breast cancer    Family history of melanoma    Family history of rectal cancer    Generalized anxiety disorder    Hematuria    History of kidney stones    Major depression    treated by Dr. Clovis Pu   Nephrolithiasis    and stone distal R ureter on CT and R hydronephrosis   Numbness    under l arm (resolved/intermittent)   Osteopenia 05/2013   T score minus 1.7   PONV (postoperative nausea and vomiting)    Scaphoid fracture 05/01/2022   left wrist-Dr. Caralyn Guile   TMJ (temporomandibular joint disorder)     Family History  Problem Relation Age of Onset   Diabetes  Father    Alcohol abuse Father    Depression Father    Ulcers Father    Stomach cancer Father 63       treated with partial gastrectomy. Died of other causes at 53.   Diabetes Maternal Grandmother    Hypertension Maternal Grandmother    Heart attack Maternal Grandmother    Heart disease Maternal Grandmother    Hypothyroidism Maternal Grandmother    Melanoma Maternal Grandmother 60       d.90   Hypertension Maternal Grandfather    Stroke Maternal Grandfather    Asthma Maternal Grandfather    Colon polyps Maternal Grandfather    Heart attack Paternal Grandfather    Heart disease Paternal Grandfather 33       died of MI  Arthritis Mother    Hypothyroidism Mother    Heart disease Mother 67       pacemaker, afib   COPD Mother    Diabetes Brother    Breast cancer Paternal Aunt 81       d.70s   Rectal cancer Cousin 40       d.1s Daughter of unaffected maternal uncle.    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  She has boy/girl twins, and a daughter who graduated from [employer] and is in medical school at [employer]. She is engaged.   Joslyn Hy is married, has 2 granddaughters.   Son (39 yo) alcoholic; was sent to rehab, and in sober living  environment in [place] 03/2018. He is now at [employer].   Daughter is at [employer] (for film).   Helps care for her mother (who now lives nearby); also caring for in-laws (MIL with BKA, diabetic, FIL passed away 10/24/2020)   Social Determinants of Health   Financial Resource Strain: Not on file  Food Insecurity: Not on file  Transportation Needs: Not on file  Physical Activity: Not on file  Stress: Not on file  Social Connections: Not on file  Intimate Partner Violence: Not on file    Past Medical History, Surgical history, Social history, and Family history were reviewed and updated as appropriate.   Please see review of systems for further details on the patient's review from today.   Enjoys to granddaughters 26 and 78 years old  Objective:   Physical Exam:  There were no vitals taken for this visit.  Physical Exam Constitutional:      General: She is not in acute distress.    Appearance: She is well-developed.  Musculoskeletal:        General: No deformity.  Neurological:     Mental Status: She is alert and oriented to person, place, and time.     Motor: No tremor.     Coordination: Coordination normal.     Gait: Gait normal.  Psychiatric:        Attention and Perception: Attention and perception normal.        Mood and Affect: Mood is not anxious or depressed. Affect is not labile, blunt, angry or inappropriate.        Speech: Speech normal.        Behavior: Behavior normal.        Thought Content: Thought content normal. Thought content is not delusional. Thought content does not include homicidal or suicidal ideation. Thought content does not include suicidal plan.  Cognition and Memory: Cognition normal.        Judgment: Judgment normal.     Comments: Insight intact. No auditory or visual hallucinations. No delusions.  No longer flat.     Lab Review:     Component Value Date/Time   NA 141 09/25/2021 1523   NA 141 02/02/2015 0805   K 4.1 09/25/2021 1523    K 4.1 02/02/2015 0805   CL 101 09/25/2021 1523   CO2 25 09/25/2021 1523   CO2 26 02/02/2015 0805   GLUCOSE 105 (H) 09/25/2021 1523   GLUCOSE 118 (H) 04/19/2020 1849   GLUCOSE 80 02/02/2015 0805   BUN 17 09/25/2021 1523   BUN 20.3 02/02/2015 0805   CREATININE 0.62 09/25/2021 1523   CREATININE 0.7 02/02/2015 0805   CALCIUM 9.6 09/25/2021 1523   CALCIUM 10.6 (H) 02/02/2015 0805   PROT 6.8 09/25/2021 1523   PROT 6.9 02/02/2015 0805   ALBUMIN 4.6 09/25/2021 1523   ALBUMIN 4.0 02/02/2015 0805   AST 12 09/25/2021 1523   AST 26 02/02/2015 0805   ALT 12 09/25/2021 1523   ALT 28 02/02/2015 0805   ALKPHOS 67 09/25/2021 1523   ALKPHOS 83 02/02/2015 0805   BILITOT <0.2 09/25/2021 1523   BILITOT 0.38 02/02/2015 0805   GFRNONAA 83 08/03/2020 1131   GFRAA 95 08/03/2020 1131       Component Value Date/Time   WBC 4.3 09/25/2021 1523   WBC 5.2 04/19/2020 1849   RBC 4.38 09/25/2021 1523   RBC 4.16 04/19/2020 1849   HGB 13.8 09/25/2021 1523   HGB 13.8 02/02/2015 0805   HCT 40.2 09/25/2021 1523   HCT 41.4 02/02/2015 0805   PLT 198 09/25/2021 1523   MCV 92 09/25/2021 1523   MCV 96.1 02/02/2015 0805   MCH 31.5 09/25/2021 1523   MCH 32.5 04/19/2020 1849   MCHC 34.3 09/25/2021 1523   MCHC 34.8 04/19/2020 1849   RDW 12.6 09/25/2021 1523   RDW 13.5 02/02/2015 0805   LYMPHSABS 1.5 09/25/2021 1523   LYMPHSABS 1.7 02/02/2015 0805   MONOABS 0.3 04/25/2016 1020   MONOABS 0.4 02/02/2015 0805   EOSABS 0.1 09/25/2021 1523   BASOSABS 0.0 09/25/2021 1523   BASOSABS 0.0 02/02/2015 0805    No results found for: "POCLITH", "LITHIUM"   No results found for: "PHENYTOIN", "PHENOBARB", "VALPROATE", "CBMZ"   .res Assessment: Plan:    Mild recurrent major depression ([place]) - Plan: buPROPion (WELLBUTRIN XL) 300 MG 24 hr tablet  Generalized anxiety disorder - Plan: traZODone (DESYREL) 50 MG tablet  Panic disorder with agoraphobia - Plan: traZODone (DESYREL) 50 MG tablet  Insomnia due to mental  condition - Plan: traZODone (DESYREL) 50 MG tablet  Attention deficit hyperactivity disorder (ADHD), predominantly inattentive type - Plan: buPROPion (WELLBUTRIN XL) 300 MG 24 hr tablet  Major depressive disorder, recurrent episode, moderate ([place]) - Plan: traZODone (DESYREL) 50 MG tablet  Done and symptoms have been largely controlled through the combination of increasing the trazodone, therapy with [name] and [facility].  She is satisfied with her current treatment.  Continue NAC 1200 mg daily for ADD off label. Other options if needed.  Better with Wellbutrin which could also help focus and energy up to 300 mg AM  She appears to be fairly medication sensitive but is tolerating these meds well.  The NAC is being used off label for ADD and cognitive reasons.  She does not want to take stimulants.  Follow up 1 year  Lynder Parents, MD, DFAPA  Future Appointments  Date Time Provider [place]  08/16/2022  8:45 AM Rita Ohara, MD [facility]    No orders of the defined types were placed in this encounter.     -------------------------------

## 2022-08-01 ENCOUNTER — Other Ambulatory Visit: Payer: Self-pay | Admitting: Psychiatry

## 2022-08-01 DIAGNOSIS — F330 Major depressive disorder, recurrent, mild: Secondary | ICD-10-CM

## 2022-08-01 DIAGNOSIS — F900 Attention-deficit hyperactivity disorder, predominantly inattentive type: Secondary | ICD-10-CM

## 2022-08-03 DIAGNOSIS — S62025D Nondisplaced fracture of middle third of navicular [scaphoid] bone of left wrist, subsequent encounter for fracture with routine healing: Secondary | ICD-10-CM | POA: Diagnosis not present

## 2022-08-03 DIAGNOSIS — M13842 Other specified arthritis, left hand: Secondary | ICD-10-CM | POA: Diagnosis not present

## 2022-08-14 NOTE — Patient Instructions (Signed)
  HEALTH MAINTENANCE RECOMMENDATIONS:  It is recommended that you get at least 30 minutes of aerobic exercise at least 5 days/week (for weight loss, you may need as much as 60-90 minutes). This can be any activity that gets your heart rate up. This can be divided in 10-15 minute intervals if needed, but try and build up your endurance at least once a week.  Weight bearing exercise is also recommended twice weekly.  Eat a healthy diet with lots of vegetables, fruits and fiber.  "Colorful" foods have a lot of vitamins (ie green vegetables, tomatoes, red peppers, etc).  Limit sweet tea, regular sodas and alcoholic beverages, all of which has a lot of calories and sugar.  Up to 1 alcoholic drink daily may be beneficial for women (unless trying to lose weight, watch sugars).  Drink a lot of water.  Calcium recommendations are 1200-1500 mg daily (1500 mg for postmenopausal women or women without ovaries), and vitamin D 1000 IU daily.  This should be obtained from diet and/or supplements (vitamins), and calcium should not be taken all at once, but in divided doses.  Monthly self breast exams and yearly mammograms for women over the age of 82 is recommended.  Sunscreen of at least SPF 30 should be used on all sun-exposed parts of the skin when outside between the hours of 10 am and 4 pm (not just when at beach or pool, but even with exercise, golf, tennis, and yard work!)  Use a sunscreen that says "broad spectrum" so it covers both UVA and UVB rays, and make sure to reapply every 1-2 hours.  Remember to change the batteries in your smoke detectors when changing your clock times in the spring and fall. Carbon monoxide detectors are recommended for your home.  Use your seat belt every time you are in a car, and please drive safely and not be distracted with cell phones and texting while driving.  I recommend having a follow-up bone density test in 12/2022. This will be 2 years from the last one, which showed  some thinning of the bones.  This was ordered by Dr. Chalmers Cater, so she will likely order the follow-up test as well.

## 2022-08-14 NOTE — Progress Notes (Signed)
No chief complaint on file.  Kristen Gamble is a 62 y.o. female who presents for a complete physical.    She had UTI treated by urgent care in December. She reports urinary symptoms completely resolved. Glucose was noted in the urine, so labs were done.  B-met was normal, with glu of 74, and A1c was 5.5% (06/2022).  High cholesterol: Current diet--she doesn't eat any meat, eats fish.  Cut back on dairy, very little cheese, uses Lactaid or oat milk. Snacks on nuts. Due for recheck of lipids. Lab Results  Component Value Date   CHOL 261 (H) 08/07/2021   HDL 90 08/07/2021   LDLCALC 162 (H) 08/07/2021   TRIG 60 08/07/2021   CHOLHDL 2.9 08/07/2021   She had CT coronary scan in November, had calcium score of zero. She had requested this due to her high cholesterol and family history of heart disease-- Mother--afib and pacemaker at 46 PGF died of MI at age 86 MGM with CAD, MI around 83.   She is under the care of Dr. Chalmers Cater for hypothyroidism.  In October TSH was 5.02  Her Arm[place] thyroid dose was increased from 60 mg 5 days/week to taking it 6 days/week (skips Saturday).  Recheck???   Abdominal pain--s/p evaluation with CT abdomen (01/2022, nonobstructive L renal calculus, mild hepatic steatosis), nuclear med study (11/2021, patent cystic and CBD, normal GB EF) and normal abdominal US 10/2021. She has a hiatal hernia. Doing better, using Gas-X and IBGard. She has some intermittent diarrhea.  Palpitations--occurring a few times/week, fast but short-lived. Worse with stress, never occurs with exertion-- usually it  is when she is just sitting, watching TV or going to bed, sometimes when working. She previously saw Dr. Radford Pax.  Normal stress myoview in 12/2014.  LBP: she still has some ongoing tightness and pain L lower back, and left neck and shoulder. She gets dry needling as needed, sometimes can go several months without needing any treatment. She also uses foam roller, stretches, gets regular  exercise.  History of L breast cancer.  She had been under the care of Dr. [name], but was released from care at last visit, in 08/2020.  She had grade 1 invasive ductal carcinoma, strongly estrogen and progesterone receptor positive, HER2/neu negative.  She is s/p bilateral mastectomy, with subpectoral implant placement July 2008.  Implants were removed due to rupture on 12/16/2017.   Gabapentin was prescribed by Dr. [name] for complaints of shooting pains in L chest, and also hot flashes.  The shooting pains are improved, but still having some discomfort at the left chest, sore spots (previously evaluated with x-rays and CT scan, no etiology found). She continues to have these, suspects muscular related to her activity. No significant change to her discomfort. She gets a little bit of pressure or muscle soreness on the L chest.   Gabapentin was tapered off by Dr. Clovis Pu. Any recurrence of shooting pains??  She is under the care of Dr. Clovis Pu for anxiety and ADD.  Last seen earlier in the month. She continues on Wellbutrin '300mg'$  daily, to help with focus and depression symptoms.  She continues to use Trazadone for sleep, now at just '50mg'$  (at one point was on '200mg'$ ).  Feels less sedated since trazodone tapered down, and gabapentin stopped ???***  Osteopenia:  She had previously received IV bisphosphonate therapy (while on cancer treatment in 2008).  She had 3 doses, 6 months apart.  Went to ER after the first dose, had a lower dose  the second and third doses. Switched oncologists to Dr. [name] (from [facility]), who took her off the bisphosphonate, and put her on Tamoxifen. She has been off tamoxifen since 2015.  Last DEXA was done 12/2020, ordered by Dr. Chalmers Cater. T -2.2 R fem neck, T-2.0 L fem neck.   Immunization History  Administered Date(s) Administered   Hepatitis A, Adult 11/12/2014   Influenza, Quadrivalent, Recombinant, Inj, Pf 04/25/2020, 04/28/2021   Influenza,inj,Quad PF,6+ Mos 04/10/2018,  04/25/2019   Influenza-Unspecified 06/04/2017, 04/16/2019, 04/25/2020, 05/03/2022   PFIZER(Purple Top)SARS-COV-2 Vaccination 09/14/2019, 09/30/2019, 10/21/2019, 06/14/2020, 06/27/2020   Pfizer Covid-19 Vaccine Bivalent Booster 28yr & up 05/28/2021   Tdap 10/22/2011, 08/07/2021   Typhoid Live 11/12/2014   Zoster Recombinat (Shingrix) 09/22/2021, 02/23/2022   Last pap smear: 2013, normal, by Dr. FPhineas Real  She reports she has had one since, with Dr. GHelane Rima  She is TAH/BSO 03/2007 with benign pathology. Sees Dr. GHelane Rimayearly. Last mammogram: n/a since mastectomy Last colonoscopy: 04/24/2016, Dr. BCristina Gong Diverticulosis noted. She has h/o polyps Last DEXA: 12/2020 T-2.2 R fem neck, T-2.0 L fem neck.  Dentist: twice yearly Ophtho: once yearly Exercise: Has Peloton and treadmill, exercising 4-5 days/week, plus some walking.  She works with trainer 2x/week, and does weights once/week on her own.    Vitamin D-OH level last checked in 05/2019, normal at 42  PMH, PSH, SH and FH were reviewed and updated.     ROS:  The patient denies anorexia, fever, weight changes, headaches, decreased hearing, ear pain, sore throat, breast concerns, syncope, dyspnea on exertion, cough, swelling, nausea, vomiting, diarrhea, abdominal pain, melena, hematochezia, indigestion/heartburn, hematuria, dysuria, vaginal bleeding, discharge, odor or itch, genital lesions, joint pains (just in fingers/hands/thumbs, known OA),  weakness, tremor, suspicious skin lesions (sees derm yearly), abnormal bleeding/bruising, or enlarged lymph nodes. Urge incontinence some mornings, sometimes urge with a "bad sneeze", unchanged. Hot flashes at night, only occasionally. Moods  Palpitations intermittently Chest pains, left sided, chronic (see HPI), never exertional, and not particularly worse with current stress. Constipation is controlled by Miralax. Vaginal dryness, pain with intercourse (improved with vitamin E suppositories),  decreased libido Numbness in feet is infrequent/improved  L wrist pain? (S/p scaphoid fx 03/2022)    PHYSICAL EXAM:  There were no vitals taken for this visit.  Wt Readings from Last 3 Encounters:  05/07/22 106 lb 9.6 oz (48.4 kg)  09/25/21 113 lb 3.2 oz (51.3 kg)  09/13/21 116 lb 12.8 oz (53 kg)    General Appearance:    Alert, cooperative, no distress, appears stated age  Head:    Normocephalic, without obvious abnormality, atraumatic  Eyes:    PERRL, conjunctiva and sclera are clear. EOM's intact, fundi benign  Ears:    Normal TM's and external ear canals  Nose:   No drainage or sinus tenderness  Throat:   Normal mucosa  Neck:   Supple, no lymphadenopathy;  thyroid:  no enlargement/ tenderness/nodules; no carotid bruit or JVD  Back:    Spine nontender, no curvature, ROM normal, no CVA  tenderness  Lungs:    Clear to auscultation bilaterally without wheezes, rales or  ronchi; respirations unlabored  Chest Wall:   Absence of breasts. WHSS (horizontal) bilaterally. No axillary lymphadenopathy. Nontender.   Heart:    Bradycardic.  S1 and S2 normal, no murmur, rub or gallop, no ectopy  Breast Exam:   See chest wall above.  Abdomen:     Soft, non-tender, nondistended, normoactive bowel sounds,    no masses, no hepatosplenomegaly  Genitalia:    Deferred to GYN       Extremities:   No clubbing, cyanosis or edema  Pulses:   2+ and symmetric all extremities  Skin:   Skin color, texture, turgor normal, no rashes or lesions  Lymph nodes:   Cervical, supraclavicular, and axillary nodes normal  Neurologic:   Normal strength, sensation and gait; reflexes 2+ and symmetric throughout                               Psych:   Normal mood, affect, hygiene and grooming.  ***bradycardic? Defer breast exam to GYN?? Axillary LAD  ASSESSMENT/PLAN:  Urine dip (had UTI last month, tx'd by UC). Has she had TSH checked since October, when dose as changed? Does she want recheck today? If she sees  [place] yearly, doesn't need breast/pelvic  COVID booster? HAS FUTURE ORDERS FOR LIPIDS AND TSH, supposed to be done end of November.  Lipids, ?TSH Cbc, c-met?  Recommended at least 30 minutes of aerobic activity at least 5 days/week, weight-bearing exercise at least 2x/week; proper sunscreen use reviewed; healthy diet, including goals of calcium and vitamin D intake and alcohol recommendations (less than or equal to 1 drink/day) reviewed; regular seatbelt use; changing batteries in smoke detectors.  Immunization recommendations discussed--continue yearly flu shots.   Colonoscopy recommendations reviewed--UTD.  DEXA 12/2022 (to be ordered by Dr. Chalmers Cater)   F/u 1 year, sooner prn

## 2022-08-16 ENCOUNTER — Ambulatory Visit (INDEPENDENT_AMBULATORY_CARE_PROVIDER_SITE_OTHER): Payer: BC Managed Care – PPO | Admitting: Family Medicine

## 2022-08-16 ENCOUNTER — Encounter: Payer: Self-pay | Admitting: Family Medicine

## 2022-08-16 VITALS — BP 98/58 | HR 60 | Ht 63.5 in | Wt 109.6 lb

## 2022-08-16 DIAGNOSIS — M85851 Other specified disorders of bone density and structure, right thigh: Secondary | ICD-10-CM

## 2022-08-16 DIAGNOSIS — M85852 Other specified disorders of bone density and structure, left thigh: Secondary | ICD-10-CM

## 2022-08-16 DIAGNOSIS — E039 Hypothyroidism, unspecified: Secondary | ICD-10-CM

## 2022-08-16 DIAGNOSIS — E7800 Pure hypercholesterolemia, unspecified: Secondary | ICD-10-CM

## 2022-08-16 DIAGNOSIS — Z0000 Encounter for general adult medical examination without abnormal findings: Secondary | ICD-10-CM | POA: Diagnosis not present

## 2022-08-16 DIAGNOSIS — Z853 Personal history of malignant neoplasm of breast: Secondary | ICD-10-CM

## 2022-08-16 LAB — POCT URINALYSIS DIP (PROADVANTAGE DEVICE)
Bilirubin, UA: NEGATIVE
Glucose, UA: NEGATIVE mg/dL
Ketones, POC UA: NEGATIVE mg/dL
Nitrite, UA: NEGATIVE
Protein Ur, POC: NEGATIVE mg/dL
Specific Gravity, Urine: 1.025
Urobilinogen, Ur: 0.2
pH, UA: 6 (ref 5.0–8.0)

## 2022-08-16 LAB — CBC WITH DIFFERENTIAL/PLATELET

## 2022-08-17 LAB — COMPREHENSIVE METABOLIC PANEL
ALT: 23 IU/L (ref 0–32)
AST: 25 IU/L (ref 0–40)
Albumin/Globulin Ratio: 2.1 (ref 1.2–2.2)
Albumin: 4.7 g/dL (ref 3.8–4.9)
Alkaline Phosphatase: 77 IU/L (ref 44–121)
BUN/Creatinine Ratio: 21 (ref 12–28)
BUN: 15 mg/dL (ref 8–27)
Bilirubin Total: 0.3 mg/dL (ref 0.0–1.2)
CO2: 25 mmol/L (ref 20–29)
Calcium: 9.4 mg/dL (ref 8.7–10.3)
Chloride: 103 mmol/L (ref 96–106)
Creatinine, Ser: 0.71 mg/dL (ref 0.57–1.00)
Globulin, Total: 2.2 g/dL (ref 1.5–4.5)
Glucose: 92 mg/dL (ref 70–99)
Potassium: 4.3 mmol/L (ref 3.5–5.2)
Sodium: 141 mmol/L (ref 134–144)
Total Protein: 6.9 g/dL (ref 6.0–8.5)
eGFR: 97 mL/min/{1.73_m2} (ref >59)

## 2022-08-17 LAB — LIPID PANEL
Chol/HDL Ratio: 2.5 ratio (ref 0.0–4.4)
Cholesterol, Total: 253 mg/dL — ABNORMAL HIGH (ref 100–199)
HDL: 101 mg/dL (ref >39)
LDL Chol Calc (NIH): 145 mg/dL — ABNORMAL HIGH (ref 0–99)
Triglycerides: 43 mg/dL (ref 0–149)
VLDL Cholesterol Cal: 7 mg/dL (ref 5–40)

## 2022-08-17 LAB — CBC WITH DIFFERENTIAL/PLATELET
Basophils Absolute: 0 10*3/uL (ref 0.0–0.2)
Basos: 0 %
EOS (ABSOLUTE): 0.1 10*3/uL (ref 0.0–0.4)
Eos: 1 %
Hematocrit: 41.3 % (ref 34.0–46.6)
Hemoglobin: 14 g/dL (ref 11.1–15.9)
Immature Grans (Abs): 0 10*3/uL (ref 0.0–0.1)
Immature Granulocytes: 0 %
Lymphocytes Absolute: 1.8 10*3/uL (ref 0.7–3.1)
Lymphs: 40 %
MCH: 32.3 pg (ref 26.6–33.0)
MCHC: 33.9 g/dL (ref 31.5–35.7)
MCV: 95 fL (ref 79–97)
Monocytes Absolute: 0.5 10*3/uL (ref 0.1–0.9)
Monocytes: 10 %
Neutrophils Absolute: 2.2 10*3/uL (ref 1.4–7.0)
Neutrophils: 49 %
Platelets: 226 10*3/uL (ref 150–450)
RBC: 4.33 x10E6/uL (ref 3.77–5.28)
RDW: 12.6 % (ref 11.7–15.4)
WBC: 4.5 10*3/uL (ref 3.4–10.8)

## 2022-08-17 LAB — TSH: TSH: 3.38 u[IU]/mL (ref 0.450–4.500)

## 2022-08-22 ENCOUNTER — Encounter: Payer: Self-pay | Admitting: *Deleted

## 2022-10-29 DIAGNOSIS — Z9011 Acquired absence of right breast and nipple: Secondary | ICD-10-CM | POA: Diagnosis not present

## 2022-10-29 DIAGNOSIS — C50912 Malignant neoplasm of unspecified site of left female breast: Secondary | ICD-10-CM | POA: Diagnosis not present

## 2022-12-03 DIAGNOSIS — H5213 Myopia, bilateral: Secondary | ICD-10-CM | POA: Diagnosis not present

## 2022-12-03 DIAGNOSIS — H16223 Keratoconjunctivitis sicca, not specified as Sjogren's, bilateral: Secondary | ICD-10-CM | POA: Diagnosis not present

## 2023-01-09 DIAGNOSIS — N951 Menopausal and female climacteric states: Secondary | ICD-10-CM | POA: Diagnosis not present

## 2023-01-09 DIAGNOSIS — M810 Age-related osteoporosis without current pathological fracture: Secondary | ICD-10-CM | POA: Diagnosis not present

## 2023-01-09 DIAGNOSIS — Z853 Personal history of malignant neoplasm of breast: Secondary | ICD-10-CM | POA: Diagnosis not present

## 2023-01-09 LAB — HM DEXA SCAN

## 2023-01-14 ENCOUNTER — Encounter: Payer: Self-pay | Admitting: *Deleted

## 2023-01-14 DIAGNOSIS — M810 Age-related osteoporosis without current pathological fracture: Secondary | ICD-10-CM

## 2023-01-15 DIAGNOSIS — E039 Hypothyroidism, unspecified: Secondary | ICD-10-CM | POA: Diagnosis not present

## 2023-01-15 DIAGNOSIS — E210 Primary hyperparathyroidism: Secondary | ICD-10-CM | POA: Diagnosis not present

## 2023-01-15 DIAGNOSIS — M8580 Other specified disorders of bone density and structure, unspecified site: Secondary | ICD-10-CM | POA: Diagnosis not present

## 2023-01-22 DIAGNOSIS — N200 Calculus of kidney: Secondary | ICD-10-CM | POA: Diagnosis not present

## 2023-01-22 DIAGNOSIS — E039 Hypothyroidism, unspecified: Secondary | ICD-10-CM | POA: Diagnosis not present

## 2023-01-22 DIAGNOSIS — M8580 Other specified disorders of bone density and structure, unspecified site: Secondary | ICD-10-CM | POA: Diagnosis not present

## 2023-01-22 DIAGNOSIS — E210 Primary hyperparathyroidism: Secondary | ICD-10-CM | POA: Diagnosis not present

## 2023-02-06 DIAGNOSIS — N200 Calculus of kidney: Secondary | ICD-10-CM | POA: Diagnosis not present

## 2023-03-06 DIAGNOSIS — N200 Calculus of kidney: Secondary | ICD-10-CM | POA: Diagnosis not present

## 2023-03-06 DIAGNOSIS — E210 Primary hyperparathyroidism: Secondary | ICD-10-CM | POA: Diagnosis not present

## 2023-03-12 DIAGNOSIS — N200 Calculus of kidney: Secondary | ICD-10-CM | POA: Diagnosis not present

## 2023-03-20 DIAGNOSIS — M810 Age-related osteoporosis without current pathological fracture: Secondary | ICD-10-CM | POA: Diagnosis not present

## 2023-05-07 DIAGNOSIS — M8580 Other specified disorders of bone density and structure, unspecified site: Secondary | ICD-10-CM | POA: Diagnosis not present

## 2023-05-07 DIAGNOSIS — E210 Primary hyperparathyroidism: Secondary | ICD-10-CM | POA: Diagnosis not present

## 2023-05-07 DIAGNOSIS — R5383 Other fatigue: Secondary | ICD-10-CM | POA: Diagnosis not present

## 2023-05-07 DIAGNOSIS — E039 Hypothyroidism, unspecified: Secondary | ICD-10-CM | POA: Diagnosis not present

## 2023-05-07 LAB — VITAMIN D 25 HYDROXY (VIT D DEFICIENCY, FRACTURES): Vit D, 25-Hydroxy: 54.4

## 2023-05-07 LAB — TSH: TSH: 2.19 (ref 0.41–5.90)

## 2023-05-10 ENCOUNTER — Encounter: Payer: Self-pay | Admitting: Family Medicine

## 2023-05-13 DIAGNOSIS — Z9889 Other specified postprocedural states: Secondary | ICD-10-CM | POA: Diagnosis not present

## 2023-05-13 DIAGNOSIS — Z23 Encounter for immunization: Secondary | ICD-10-CM | POA: Diagnosis not present

## 2023-05-13 DIAGNOSIS — M810 Age-related osteoporosis without current pathological fracture: Secondary | ICD-10-CM | POA: Diagnosis not present

## 2023-05-13 DIAGNOSIS — N200 Calculus of kidney: Secondary | ICD-10-CM | POA: Diagnosis not present

## 2023-05-13 DIAGNOSIS — E876 Hypokalemia: Secondary | ICD-10-CM | POA: Diagnosis not present

## 2023-05-15 NOTE — Progress Notes (Unsigned)
No chief complaint on file.  Patient presents for travel consult. She will be traveling to [place] next month. Message sent by patient: "We are traveling to [place] in November - no rural areas but think I should have the typhoid shot and the Hep A and B"   Immunization History  Administered Date(s) Administered   Hepatitis A, Adult 11/12/2014   Influenza, Quadrivalent, Recombinant, Inj, Pf 04/25/2020, 04/28/2021   Influenza,inj,Quad PF,6+ Mos 04/10/2018, 04/25/2019   Influenza-Unspecified 06/04/2017, 04/16/2019, 04/25/2020, 05/03/2022   PFIZER(Purple Top)SARS-COV-2 Vaccination 09/30/2019, 10/21/2019, 06/14/2020, 06/27/2020   Pfizer Covid-19 Vaccine Bivalent Booster 49yrs & up 05/28/2021   Tdap 10/22/2011, 08/07/2021   Typhoid Live 11/12/2014   Zoster Recombinant(Shingrix) 09/22/2021, 02/23/2022       PMH, PSH, SH reviewed   ROS:    PHYSICAL EXAM:  There were no vitals taken for this visit.      ASSESSMENT/PLAN:  Verify vaccines against NCIR and/or whatever imms she brings into visit.  Needs flu and covid boosters if hasn't had. Shouldn't get all vaccines at one time, will need to figure it out based on which ones she truly needs.  If hasn't had 2nd Hep A, can give that today. If she hasn't had any Hep B vaccinations at all, can get Heplisav-B, and NV for the second one.    Last vivotif was >5 years ago, so needs again. Should complete the series >1 week prior to exposure.  Take 1 hour before meal with cold or lukewarm drink

## 2023-05-16 ENCOUNTER — Ambulatory Visit (INDEPENDENT_AMBULATORY_CARE_PROVIDER_SITE_OTHER): Payer: BC Managed Care – PPO | Admitting: Family Medicine

## 2023-05-16 ENCOUNTER — Encounter: Payer: Self-pay | Admitting: Family Medicine

## 2023-05-16 VITALS — BP 110/64 | HR 68 | Wt 115.8 lb

## 2023-05-16 DIAGNOSIS — Z7184 Encounter for health counseling related to travel: Secondary | ICD-10-CM | POA: Diagnosis not present

## 2023-05-16 DIAGNOSIS — Z23 Encounter for immunization: Secondary | ICD-10-CM | POA: Diagnosis not present

## 2023-05-16 MED ORDER — TYPHOID VACCINE PO CPDR: 1.0000 | DELAYED_RELEASE_CAPSULE | ORAL | 0 refills | Status: DC

## 2023-05-27 DIAGNOSIS — E210 Primary hyperparathyroidism: Secondary | ICD-10-CM | POA: Diagnosis not present

## 2023-06-25 ENCOUNTER — Other Ambulatory Visit: Payer: BC Managed Care – PPO

## 2023-07-02 ENCOUNTER — Other Ambulatory Visit: Payer: Self-pay | Admitting: Psychiatry

## 2023-07-31 ENCOUNTER — Ambulatory Visit: Payer: BC Managed Care – PPO | Admitting: Psychiatry

## 2023-08-06 ENCOUNTER — Other Ambulatory Visit: Payer: Self-pay | Admitting: Psychiatry

## 2023-08-06 DIAGNOSIS — F4001 Agoraphobia with panic disorder: Secondary | ICD-10-CM

## 2023-08-06 DIAGNOSIS — F411 Generalized anxiety disorder: Secondary | ICD-10-CM

## 2023-08-06 DIAGNOSIS — F331 Major depressive disorder, recurrent, moderate: Secondary | ICD-10-CM

## 2023-08-06 DIAGNOSIS — F5105 Insomnia due to other mental disorder: Secondary | ICD-10-CM

## 2023-08-13 ENCOUNTER — Ambulatory Visit: Payer: BC Managed Care – PPO | Admitting: Family Medicine

## 2023-08-13 VITALS — BP 100/60 | HR 60 | Temp 98.4°F | Ht 63.5 in | Wt 115.8 lb

## 2023-08-13 DIAGNOSIS — J0100 Acute maxillary sinusitis, unspecified: Secondary | ICD-10-CM

## 2023-08-13 MED ORDER — AMOXICILLIN-POT CLAVULANATE 875-125 MG PO TABS: 1.0000 | ORAL_TABLET | Freq: Two times a day (BID) | ORAL | 0 refills | Status: DC

## 2023-08-13 NOTE — Progress Notes (Signed)
   Subjective:    Patient ID: Kristen Gamble, female    DOB: [birth year], 63 y.o.   MRN: [id number]  HPI She states that approximately 10 days ago she had cough, headache, sinus congestion and fatigue.  She checked for COVID and was negative.  She did get slightly better and was using OTC medications until several days ago when chest aching got worse as well as sinus and ear pressure with slight sore throat, PND and a dry cough.   Review of Systems     Objective:    Physical Exam Alert and in no distress.  Nasal mucosa is normal.  Slight tenderness over frontal and maxillary sinuses.  Tympanic membranes and canals are normal. Pharyngeal area is normal. Neck is supple without adenopathy or thyromegaly. Cardiac exam shows a regular sinus rhythm without murmurs or gallops. Lungs are clear to auscultation.        Assessment & Plan:  Acute maxillary sinusitis, recurrence not specified - Plan: amoxicillin-clavulanate (AUGMENTIN) 875-125 MG tablet Encouraged her to take the full 10 days dosing and if not entirely better call for possible repeat dorsal.

## 2023-08-20 NOTE — Patient Instructions (Addendum)
  HEALTH MAINTENANCE RECOMMENDATIONS:  It is recommended that you get at least 30 minutes of aerobic exercise at least 5 days/week (for weight loss, you may need as much as 60-90 minutes). This can be any activity that gets your heart rate up. This can be divided in 10-15 minute intervals if needed, but try and build up your endurance at least once a week.  Weight bearing exercise is also recommended twice weekly.  Eat a healthy diet with lots of vegetables, fruits and fiber.  Colorful foods have a lot of vitamins (ie green vegetables, tomatoes, red peppers, etc).  Limit sweet tea, regular sodas and alcoholic beverages, all of which has a lot of calories and sugar.  Up to 1 alcoholic drink daily may be beneficial for women (unless trying to lose weight, watch sugars).  Drink a lot of water.  Calcium recommendations are 1200-1500 mg daily (1500 mg for postmenopausal women or women without ovaries), and vitamin D  1000 IU daily.  This should be obtained from diet and/or supplements (vitamins), and calcium should not be taken all at once, but in divided doses.  Monthly self breast exams and yearly mammograms for women over the age of 41 is recommended.  Sunscreen of at least SPF 30 should be used on all sun-exposed parts of the skin when outside between the hours of 10 am and 4 pm (not just when at beach or pool, but even with exercise, golf, tennis, and yard work!)  Use a sunscreen that says broad spectrum so it covers both UVA and UVB rays, and make sure to reapply every 1-2 hours.  Remember to change the batteries in your smoke detectors when changing your clock times in the spring and fall. Carbon monoxide detectors are recommended for your home.  Use your seat belt every time you are in a car, and please drive safely and not be distracted with cell phones and texting while driving.  Please let us  know if you have worsening palpitations (frequency or duration).  We would recheck your thyroid , and  if normal, consider additional evaluation (heart monitor, echocardiogram, etc).  Please get your second Heplisav-B   vaccine from the pharmacy.

## 2023-08-20 NOTE — Progress Notes (Signed)
 Chief Complaint  Patient presents with   Annual Exam    Fasting annual exam. Would  prefer to ger Heplisav #2 at pharmacy due to cost. Had flu shot and declined covid. Had TSH in the fall. No new concerns. I sent fax to get her labs yesterday, didn't get. She has in her phone. TSH 2.19 and vit d 54.4 05/07/23.(Abstracted).   Kristen Gamble is a 63 y.o. female who presents for a complete physical.   She was seen by Dr. Joyce last week, and prescribed augmentin  for a sinus infection. She still has some cough and bilateral ear plugging (like in a tunnel).  Mucus is now mostly clear.   High cholesterol: She tries to follow a low cholesterol diet.  Her HDL has been excellent. She had a calcium score of 0 in 05/2022.  Current diet--she doesn't eat any meat, eats fish.  Cut back on dairy, very little cheese, uses Lactaid (2%) rarely. Snacks on nuts.  Eggs 4-5 eggs/week. Vinaigrette dressings, rare ranch, some creamy soups.  Due for recheck of lipids.  Lab Results  Component Value Date   CHOL 253 (H) 08/16/2022   HDL 101 08/16/2022   LDLCALC 145 (H) 08/16/2022   TRIG 43 08/16/2022   CHOLHDL 2.5 08/16/2022     She is under the care of Dr. Balan for hypothyroidism.  No records have been received from Dr. Balan.  In 04/2022 her armour thyroid  dose was increased from 60 mg 5x/wk to 6 days/wk (skips Saturday), and recheck in our office was normal in 08/2022. She continues on this dose and last TSH on 05/07/23 was normal at 2.19. She denies changes to hair/skin/nails/bowels/energy/moods.  Lab Results  Component Value Date   TSH 2.19 05/07/2023     Abdominal pain--s/p evaluation with CT abdomen (01/2022, nonobstructive L renal calculus, mild hepatic steatosis), nuclear med study (11/2021, patent cystic and CBD, normal GB EF) and normal abdominal US  10/2021. She has a hiatal hernia.  She continues to do well, using Gas-X prn (once every few months). Bowels are normal, no further diarrhea.  In fact, she  has some constipation, controlled with daily Miralax. Improved with avoiding heavy and fried foods. She only has rare heartburn, had some this week, more related to stress than diet.  Palpitations--occurring a few times/week, fast but short-lived. Worse with stress, never occurs with exertion-- usually it  is when she is just sitting, watching TV or going to bed, sometimes when working.  Comes and goes, worse related to stress. Some stress this past week, noted some palpitations. She previously saw Dr. Shlomo.  Normal stress myoview in 12/2014.  History of L breast cancer.  She had been under the care of Dr. Layla, but was released from care at last visit, in 08/2020.  She had grade 1 invasive ductal carcinoma, strongly estrogen and progesterone receptor positive, HER2/neu negative.  She is s/p bilateral mastectomy, with subpectoral implant placement July 2008.  Implants were removed due to rupture on 12/16/2017.   Gabapentin  was prescribed by Dr. Layla for complaints of shooting pains in L chest, and also hot flashes.  The shooting pains resolved, but still has occasional sore spots in chest (previously evaluated with x-rays and CT scan, no etiology found).  Gabapentin  was tapered off by Dr. Geoffry and she hasn't had any recurrence of shooting pain. Has occasional hot flashes, night sweats, tolerable. Not bad enough to warrant any medications.  She is under the care of Dr. Geoffry for anxiety and ADD.  She continues on Wellbutrin  300mg  daily, to help with focus and depression symptoms.  She continues to use 50mg  Trazodone  for sleep.  Osteoporosis:  She had previously received IV bisphosphonate therapy (while on cancer treatment in 2008).  She had 3 doses, 6 months apart.  Went to ER after the first dose, had a lower dose the second and third doses. Switched oncologists to Dr. Layla (from [facility]), who took her off the bisphosphonate, and put her on Tamoxifen , which she completed in 2015.  Last  DEXA was done in 12/2022 (ordered by Dr. Tommas), T-2.5 at bilateral fem necks. (Prev DEXA was 12/2020, T -2.2 R fem neck, T-2.0 L fem neck). She was started on Prolia , last injection in 03/2023. She states Dr. Tommas did some evaluation, found she was spilling calcium into her urine.  She started her on hydrochlorothiazide for a month, and things had stabilized, but potassium was low.  She added more potassium-rich foods to her diet and recheck was normal. She would like potassium rechecked today.  She has been taking 1000 IU of D3 daily (prev had low D level with Dr. Tommas, had dropped to 38).  Recent level (04/2023) was normal.  Calcium--doesn't take supplements, tries to get through diet. Cottage cheese, fortified Lactaid when she has it (rarely), yogurt, some cheese, greens. Weight training 3x/week, and uses a weighted vest 2x/week (12#)  Immunization History  Administered Date(s) Administered   Hepatitis A, Adult 11/12/2014, 05/16/2023   Hepb-cpg 05/16/2023   Influenza Inj Mdck Quad With Preservative 06/17/2017   Influenza, Quadrivalent, Recombinant, Inj, Pf 04/25/2020, 04/28/2021, 05/13/2023   Influenza,inj,Quad PF,6+ Mos 04/10/2018, 04/25/2019   Influenza-Unspecified 06/04/2017, 04/16/2019, 04/25/2020, 05/03/2022   PFIZER(Purple Top)SARS-COV-2 Vaccination 09/14/2019, 09/30/2019, 10/21/2019, 06/14/2020, 06/27/2020   Pfizer Covid-19 Vaccine Bivalent Booster 103yrs & up 05/28/2021   Tdap 10/22/2011, 08/07/2021   Typhoid Live 11/12/2014   Zoster Recombinant(Shingrix ) 09/22/2021, 02/23/2022   Last pap smear: 2013, normal, by Dr. Rockney.  She now sees Dr. Mat.  She is TAH/BSO 03/2007 with benign pathology. Sees Dr. Mat yearly. Last mammogram: n/a since mastectomy Last colonoscopy: 11/2021, told 5 year f/u. She has h/o polyps Last DEXA: 12/2022 T-2.5 and bilat fem necks Dentist: twice yearly Ophtho: once yearly Exercise:  Hiking, walking or [place] for cardio (at least 30 minutes/d,  4-5x/week). Weight 3x/week, plus weighted vest 2x/week.   PMH, PSH, SH and FH were reviewed and updated.  Outpatient Encounter Medications as of 08/22/2023  Medication Sig Note   amoxicillin -clavulanate (AUGMENTIN ) 875-125 MG tablet Take 1 tablet by mouth 2 (two) times daily. 08/22/2023: Has one more day left.   buPROPion  (WELLBUTRIN  XL) 300 MG 24 hr tablet Take 1 tablet (300 mg total) by mouth daily.    cholecalciferol (VITAMIN D3) 25 MCG (1000 UNIT) tablet Take 1,000 Units by mouth daily.    denosumab  (PROLIA ) 60 MG/ML SOSY injection Inject 60 mg into the skin every 6 (six) months. 05/16/2023: Last dose 03/20/23   fluticasone  (FLONASE ) 50 MCG/ACT nasal spray SHAKE LIQUID AND USE 2 SPRAYS IN EACH NOSTRIL DAILY    hydrochlorothiazide (HYDRODIURIL) 12.5 MG tablet Take 12.5 mg by mouth daily.    L-Methylfolate-Algae (DEPLIN 15) 15-90.314 MG CAPS Take 1 tablet by mouth daily.    loratadine (CLARITIN) 10 MG tablet Take 10 mg by mouth daily.    polyethylene glycol (MIRALAX / GLYCOLAX) packet Take 17 g by mouth daily as needed. 08/22/2023: daily   thyroid  (ARMOUR) 60 MG tablet Take 60 mg by mouth daily before breakfast.  08/22/2023: All days except Sat   traZODone  (DESYREL ) 50 MG tablet TAKE 1 TABLET(50 MG) BY MOUTH AT BEDTIME 08/22/2023: nightly   Bacillus Coagulans-Inulin (ALIGN PREBIOTIC-PROBIOTIC PO) Take 1 capsule by mouth daily. (Patient not taking: Reported on 08/22/2023) 08/22/2023: As needed   Multiple Vitamins-Minerals (MULTIVITAMIN WITH MINERALS) tablet Take 1 tablet daily by mouth. (Patient not taking: Reported on 08/22/2023) 08/22/2023: As needed   omeprazole  (PRILOSEC) 40 MG capsule Take 1 capsule (40 mg total) by mouth 2 (two) times daily before a meal. (Patient not taking: Reported on 05/07/2022) 08/22/2023: As needed   [DISCONTINUED] Cyanocobalamin  (VITAMIN B 12 PO) Take 1,000 mcg by mouth daily. (Patient not taking: Reported on 08/22/2023) 05/16/2023: Last dose this morning.    [DISCONTINUED] Zinc  22.5 MG  TABS Take 1 tablet by mouth daily. (Patient not taking: Reported on 08/13/2023)    No facility-administered encounter medications on file as of 08/22/2023.   Allergies  Allergen Reactions   Poison Ivy Extract Rash   Prednisone Palpitations   Morphine  Itching    REACTION: itching REACTION: itching    ROS:  The patient denies anorexia, fever, weight changes, headaches, decreased hearing, ear pain, sore throat, breast concerns, syncope, dyspnea on exertion, cough, swelling, nausea, vomiting, diarrhea, abdominal pain, melena, hematochezia, indigestion/heartburn, hematuria, dysuria, vaginal bleeding, discharge, odor or itch, genital lesions, joint pains (just in fingers/hands/thumbs, known OA),  weakness, tremor, suspicious skin lesions (sees derm yearly), abnormal bleeding/bruising, or enlarged lymph nodes.  Occasional urge incontinence. Hot flashes at night, tolerable Moods--+stress related to work, overall good. Palpitations intermittently per HPI Intermittent L sided chest pains, per HPI, and sometimes heartburn more centrally. Constipation is controlled by Miralax. Residual congestion. Occasional ringing in ears. More fatigue, worse since being sick.    PHYSICAL EXAM:  BP 110/60   Pulse 60   Ht 5' 3.5 (1.613 m)   Wt 115 lb (52.2 kg)   BMI 20.05 kg/m   Wt Readings from Last 3 Encounters:  08/22/23 115 lb (52.2 kg)  08/13/23 115 lb 12.8 oz (52.5 kg)  05/16/23 115 lb 12.8 oz (52.5 kg)   General Appearance:    Alert, cooperative, no distress, appears stated age. Occasional cough  Head:    Normocephalic, without obvious abnormality, atraumatic  Eyes:    PERRL, conjunctiva and sclera are clear. EOM's intact, fundi benign  Ears:    Normal TM's and external ear canals  Nose:   Nasal mucosa with mild edema and clear mucus; no sinus tenderness  Throat:   Normal mucosa  Neck:   Supple, no lymphadenopathy;  thyroid :  no enlargement/ tenderness/nodules; no carotid bruit or JVD  Back:     Spine nontender, no curvature, ROM normal, no CVA  tenderness  Lungs:    Clear to auscultation bilaterally without wheezes, rales or  ronchi; respirations unlabored  Chest Wall:   Absence of breasts. WHSS (horizontal) bilaterally.    Heart:    Regular rate and rhythm,  S1 and S2 normal, no murmur, rub or gallop, no ectopy  Breast Exam:   Deferred to GYN, see chest.  Abdomen:     Soft, non-tender, nondistended, normoactive bowel sounds,    no masses, no hepatosplenomegaly  Genitalia:    Deferred to GYN       Extremities:   No clubbing, cyanosis or edema  Pulses:   2+ and symmetric all extremities  Skin:   Skin color, texture, turgor normal, no rashes or lesions  Lymph nodes:   Cervical, supraclavicular, and inguinal nodes  normal  Neurologic:   Normal strength, sensation and gait; reflexes 2+ and symmetric throughout                               Psych:   Normal mood, affect, hygiene and grooming.      08/22/2023    8:37 AM 08/16/2022    8:39 AM 08/07/2021    8:46 AM 08/03/2020    9:36 AM 05/25/2019    8:39 AM  Depression screen PHQ 2/9  Decreased Interest 0 0 1 0 0  Down, Depressed, Hopeless 0 0 1 0 0  PHQ - 2 Score 0 0 2 0 0  Altered sleeping 0 0 1    Tired, decreased energy 0 3 3    Change in appetite 0 0 1    Feeling bad or failure about yourself  0 0 1    Trouble concentrating 0 0 0    Moving slowly or fidgety/restless 0 0 0    Suicidal thoughts 0 0 0    PHQ-9 Score 0 3 8    Difficult doing work/chores Not difficult at all Not difficult at all Somewhat difficult       ASSESSMENT/PLAN:   Annual physical exam - Plan: Lipid panel, CBC with Differential/Platelet, CMP14+EGFR  Osteoporosis of femur without pathological fracture - on Prolia , and HCTZ to minimize urinary calcium losses. Discussed dietary ca2+ and supplements, vit D and wt-bearing exercise  Need for hepatitis B vaccination - reminded to get 2nd injection (less expensive to get from pharmacy)  Hypothyroidism,  unspecified type - adequately replaced per labs by Dr. Tommas in October  Pure hypercholesterolemia - due for recheck. Excellent HDL in past, with calcium score of 0. Reviewed low cholesterol diet - Plan: Lipid panel  Personal history of breast cancer  Medication monitoring encounter - Plan: CBC with Differential/Platelet, CMP14+EGFR  PALPITATIONS - infrequent, worse with stress. Discussed plan should they increase in frequency/duration  Recent sinusitis--improving. To complete course of ABX.  Records from Dr. Becki office requested.   Recommended at least 30 minutes of aerobic activity at least 5 days/week, weight-bearing exercise at least 2x/week; proper sunscreen use reviewed; healthy diet, including goals of calcium and vitamin D  intake and alcohol recommendations (less than or equal to 1 drink/day) reviewed; regular seatbelt use; changing batteries in smoke detectors.  Immunization recommendations discussed--continue yearly flu shots.   Declined COVID booster.  Heplisav-B  #2 is due, prefers to get from pharmacy (less expensive). Colonoscopy recommendations reviewed--UTD.    F/u 1 year, sooner prn

## 2023-08-22 ENCOUNTER — Encounter: Payer: Self-pay | Admitting: *Deleted

## 2023-08-22 ENCOUNTER — Ambulatory Visit: Payer: BC Managed Care – PPO | Admitting: Family Medicine

## 2023-08-22 ENCOUNTER — Encounter: Payer: Self-pay | Admitting: Family Medicine

## 2023-08-22 VITALS — BP 110/60 | HR 60 | Ht 63.5 in | Wt 115.0 lb

## 2023-08-22 DIAGNOSIS — Z0000 Encounter for general adult medical examination without abnormal findings: Secondary | ICD-10-CM | POA: Diagnosis not present

## 2023-08-22 DIAGNOSIS — E039 Hypothyroidism, unspecified: Secondary | ICD-10-CM

## 2023-08-22 DIAGNOSIS — E210 Primary hyperparathyroidism: Secondary | ICD-10-CM

## 2023-08-22 DIAGNOSIS — E7800 Pure hypercholesterolemia, unspecified: Secondary | ICD-10-CM | POA: Diagnosis not present

## 2023-08-22 DIAGNOSIS — Z5181 Encounter for therapeutic drug level monitoring: Secondary | ICD-10-CM

## 2023-08-22 DIAGNOSIS — Z23 Encounter for immunization: Secondary | ICD-10-CM | POA: Diagnosis not present

## 2023-08-22 DIAGNOSIS — R002 Palpitations: Secondary | ICD-10-CM

## 2023-08-22 DIAGNOSIS — M810 Age-related osteoporosis without current pathological fracture: Secondary | ICD-10-CM | POA: Diagnosis not present

## 2023-08-22 DIAGNOSIS — Z853 Personal history of malignant neoplasm of breast: Secondary | ICD-10-CM

## 2023-08-23 ENCOUNTER — Encounter: Payer: Self-pay | Admitting: Family Medicine

## 2023-08-23 LAB — CBC WITH DIFFERENTIAL/PLATELET
Basophils Absolute: 0 10*3/uL (ref 0.0–0.2)
Basos: 0 %
EOS (ABSOLUTE): 0.1 10*3/uL (ref 0.0–0.4)
Eos: 2 %
Hematocrit: 41.8 % (ref 34.0–46.6)
Hemoglobin: 14.2 g/dL (ref 11.1–15.9)
Immature Grans (Abs): 0 10*3/uL (ref 0.0–0.1)
Immature Granulocytes: 0 %
Lymphocytes Absolute: 1.6 10*3/uL (ref 0.7–3.1)
Lymphs: 36 %
MCH: 32.7 pg (ref 26.6–33.0)
MCHC: 34 g/dL (ref 31.5–35.7)
MCV: 96 fL (ref 79–97)
Monocytes Absolute: 0.4 10*3/uL (ref 0.1–0.9)
Monocytes: 9 %
Neutrophils Absolute: 2.4 10*3/uL (ref 1.4–7.0)
Neutrophils: 53 %
Platelets: 296 10*3/uL (ref 150–450)
RBC: 4.34 x10E6/uL (ref 3.77–5.28)
RDW: 12.2 % (ref 11.7–15.4)
WBC: 4.5 10*3/uL (ref 3.4–10.8)

## 2023-08-23 LAB — LIPID PANEL
Chol/HDL Ratio: 2.9 {ratio} (ref 0.0–4.4)
Cholesterol, Total: 247 mg/dL — ABNORMAL HIGH (ref 100–199)
HDL: 86 mg/dL (ref >39)
LDL Chol Calc (NIH): 154 mg/dL — ABNORMAL HIGH (ref 0–99)
Triglycerides: 46 mg/dL (ref 0–149)
VLDL Cholesterol Cal: 7 mg/dL (ref 5–40)

## 2023-08-23 LAB — CMP14+EGFR
ALT: 24 [IU]/L (ref 0–32)
AST: 24 [IU]/L (ref 0–40)
Albumin: 4.4 g/dL (ref 3.9–4.9)
Alkaline Phosphatase: 73 [IU]/L (ref 44–121)
BUN/Creatinine Ratio: 28 (ref 12–28)
BUN: 18 mg/dL (ref 8–27)
Bilirubin Total: 0.2 mg/dL (ref 0.0–1.2)
CO2: 25 mmol/L (ref 20–29)
Calcium: 9.6 mg/dL (ref 8.7–10.3)
Chloride: 104 mmol/L (ref 96–106)
Creatinine, Ser: 0.65 mg/dL (ref 0.57–1.00)
Globulin, Total: 2.1 g/dL (ref 1.5–4.5)
Glucose: 98 mg/dL (ref 70–99)
Potassium: 4.8 mmol/L (ref 3.5–5.2)
Sodium: 143 mmol/L (ref 134–144)
Total Protein: 6.5 g/dL (ref 6.0–8.5)
eGFR: 100 mL/min/{1.73_m2} (ref >59)

## 2023-08-28 ENCOUNTER — Other Ambulatory Visit: Payer: Self-pay | Admitting: Psychiatry

## 2023-08-28 DIAGNOSIS — F330 Major depressive disorder, recurrent, mild: Secondary | ICD-10-CM

## 2023-08-28 DIAGNOSIS — F900 Attention-deficit hyperactivity disorder, predominantly inattentive type: Secondary | ICD-10-CM

## 2023-09-12 ENCOUNTER — Other Ambulatory Visit: Payer: Self-pay | Admitting: Psychiatry

## 2023-09-16 DIAGNOSIS — S62655A Nondisplaced fracture of medial phalanx of left ring finger, initial encounter for closed fracture: Secondary | ICD-10-CM | POA: Diagnosis not present

## 2023-09-16 DIAGNOSIS — M79645 Pain in left finger(s): Secondary | ICD-10-CM | POA: Diagnosis not present

## 2023-10-01 ENCOUNTER — Ambulatory Visit: Payer: BC Managed Care – PPO | Admitting: Psychiatry

## 2023-10-03 DIAGNOSIS — M79645 Pain in left finger(s): Secondary | ICD-10-CM | POA: Diagnosis not present

## 2023-10-22 DIAGNOSIS — M810 Age-related osteoporosis without current pathological fracture: Secondary | ICD-10-CM | POA: Diagnosis not present

## 2023-11-05 ENCOUNTER — Other Ambulatory Visit: Payer: Self-pay | Admitting: Psychiatry

## 2023-11-05 DIAGNOSIS — F900 Attention-deficit hyperactivity disorder, predominantly inattentive type: Secondary | ICD-10-CM

## 2023-11-05 DIAGNOSIS — F330 Major depressive disorder, recurrent, mild: Secondary | ICD-10-CM

## 2023-12-04 DIAGNOSIS — H04123 Dry eye syndrome of bilateral lacrimal glands: Secondary | ICD-10-CM | POA: Diagnosis not present

## 2023-12-04 DIAGNOSIS — H5213 Myopia, bilateral: Secondary | ICD-10-CM | POA: Diagnosis not present

## 2023-12-11 ENCOUNTER — Encounter: Payer: Self-pay | Admitting: Psychiatry

## 2023-12-11 ENCOUNTER — Ambulatory Visit: Admitting: Psychiatry

## 2023-12-11 DIAGNOSIS — F331 Major depressive disorder, recurrent, moderate: Secondary | ICD-10-CM

## 2023-12-11 DIAGNOSIS — F5105 Insomnia due to other mental disorder: Secondary | ICD-10-CM

## 2023-12-11 DIAGNOSIS — F411 Generalized anxiety disorder: Secondary | ICD-10-CM | POA: Diagnosis not present

## 2023-12-11 DIAGNOSIS — F330 Major depressive disorder, recurrent, mild: Secondary | ICD-10-CM

## 2023-12-11 DIAGNOSIS — F900 Attention-deficit hyperactivity disorder, predominantly inattentive type: Secondary | ICD-10-CM

## 2023-12-11 DIAGNOSIS — F4001 Agoraphobia with panic disorder: Secondary | ICD-10-CM | POA: Diagnosis not present

## 2023-12-11 MED ORDER — BUPROPION HCL ER (XL) 300 MG PO TB24: 300.0000 mg | ORAL_TABLET | Freq: Every day | ORAL | 3 refills | Status: DC

## 2023-12-11 MED ORDER — TRAZODONE HCL 50 MG PO TABS: 50.0000 mg | ORAL_TABLET | Freq: Every day | ORAL | 3 refills | Status: DC

## 2023-12-11 NOTE — Progress Notes (Signed)
 Kristen Gamble [id number] 07-25-1960 63 y.o.  Subjective:   Patient ID:  Kristen Gamble is a 63 y.o. (DOB 1961/03/08) female.  Chief Complaint:  Chief Complaint  Patient presents with  . Follow-up    Mood and sleep    [name] presents to the office today for follow-up of depression.  07/2020 appt:  seen  yearly..  No meds were changed.  She remained on gabapentin  300 nightly, Deplin 15, trazodone  200 nightly reduced to 100 mg HS.[name] doing really well without problems.  Exercise and [place]on helps. Son in recovery. Is having more ADHD sx and ran out of NAC a couple of weeks ago.   Depression is better with med changes and therapy and support group.  Depression is managed.  Exercise helps anxiety. Couple panic with triggers but not severe.  Patient reports stable mood and denies depressed or irritable moods.  Patient reports episodic difficulty with anxiety.  Patient denies difficulty with sleep initiation or maintenance. Denies appetite disturbance.  Patient reports that energy and motivation have been good.  Patient denies any difficulty with concentration.  Patient denies any suicidal ideation.  Sleep good. Exercise a lot with [place] and loves it.  No alcohol. Seeing [name] in therapy monthly and it's helpful.  Also attending [facility].  Father was also an alcoholic.  07/27/2021 appt noted: Added back NAC and it helps. Clearly. Depression occ including part of stress work. Wants to try weaning off gabapentin  bc no longer needed for neuropathy. Feels flat a lot of time but can enjoy things. 2 grandkids. Oldest D med school 3 rd year and engaged. Plan: Reduce gabapentin  to 2 of the 100 mg capsules at night for 2 weeks, then reduce to 1 capsule at night for 2 weeks, then stop gabapentin  Start bupropion  or Wellbutrin  1 in the morning for 1 week, then 2 in the morning which equals 300 mg. Increase NAC to 2 of the 600 mg capsules each morning.  07/30/22 appt noted: Doing good.   On  Wellbutrin  XL 300 mg and reduced trazodone  to 50 mg HS for 6 mos. Sleep ok with less trazodone . No SE. Mood is good.  Better with Wellbutrin .  Anxiety is manageable and better than in the past.. 2 D and son.  Oldest D grad from med school.   Plan no med changes  12/11/23 appt noted: Med: Wellbutrin  XL 300 mg and reduced trazodone  to 50 mg HS , NAC for ADD, L-methylfolate Still doing well. Trazodone  helps sleep.   No dep.   To [place]  and snorkeled.   Some work stress but otherwise ok.   Exercise is a huge help.   Health good.  Osteoporosis dT hx breast CA.    Gabapentin  for neuropathy.  Prior psychiatric medications include methylphenidate,  mirtazapine 45 mg, trazodone ,  Deplin, N-acetylcysteine .   She last took stimulants December 2018.  Son panic  Review of Systems:  Review of Systems  Cardiovascular:  Negative for chest pain and palpitations.  Neurological:  Negative for tremors.       Neuropathy better.  Psychiatric/Behavioral:  Positive for depression. Negative for agitation, behavioral problems, confusion, decreased concentration, dysphoric mood, hallucinations, self-injury, sleep disturbance and suicidal ideas. The patient is not nervous/anxious and is not hyperactive.     Medications: I have reviewed the patient's current medications.  Current Outpatient Medications  Medication Sig Dispense Refill  . cholecalciferol (VITAMIN D3) 25 MCG (1000 UNIT) tablet Take 1,000 Units by mouth daily.    . fluticasone  (  FLONASE ) 50 MCG/ACT nasal spray SHAKE LIQUID AND USE 2 SPRAYS IN EACH NOSTRIL DAILY 16 g 2  . hydrochlorothiazide (HYDRODIURIL) 12.5 MG tablet Take 12.5 mg by mouth daily.    Aaron Aas L-Methylfolate (DEPLIN FC) 15 MG CAPS TAKE 1 CAPSULE BY MOUTH DAILY 90 capsule 0  . L-Methylfolate-Algae (DEPLIN 15) 15-90.314 MG CAPS Take 1 tablet by mouth daily. 90 capsule 0  . loratadine (CLARITIN) 10 MG tablet Take 10 mg by mouth daily.    . Multiple Vitamins-Minerals (MULTIVITAMIN WITH  MINERALS) tablet Take 1 tablet by mouth daily.    . omeprazole  (PRILOSEC) 40 MG capsule Take 1 capsule (40 mg total) by mouth 2 (two) times daily before a meal. 60 capsule 0  . polyethylene glycol (MIRALAX / GLYCOLAX) packet Take 17 g by mouth daily as needed.    . thyroid  (ARMOUR) 60 MG tablet Take 60 mg by mouth daily before breakfast.    . amoxicillin -clavulanate (AUGMENTIN ) 875-125 MG tablet Take 1 tablet by mouth 2 (two) times daily. (Patient not taking: Reported on 12/11/2023) 20 tablet 0  . Bacillus Coagulans-Inulin (ALIGN PREBIOTIC-PROBIOTIC PO) Take 1 capsule by mouth daily. (Patient not taking: Reported on 08/22/2023)    . buPROPion  (WELLBUTRIN  XL) 300 MG 24 hr tablet Take 1 tablet (300 mg total) by mouth daily. 90 tablet 3  . denosumab  (PROLIA ) 60 MG/ML SOSY injection Inject 60 mg into the skin every 6 (six) months. (Patient not taking: Reported on 12/11/2023)    . traZODone  (DESYREL ) 50 MG tablet Take 1 tablet (50 mg total) by mouth at bedtime. 90 tablet 3   No current facility-administered medications for this visit.    Medication Side Effects: None  Allergies:  Allergies  Allergen Reactions  . Poison Ivy Extract Rash  . Prednisone Palpitations  . Morphine  Itching    REACTION: itching REACTION: itching    Past Medical History:  Diagnosis Date  . ADHD (attention deficit hyperactivity disorder) 08/2011  . Arthritis   . BCC (basal cell carcinoma of skin) 05/2020   chest, Dr. [place]  . Breast cancer (HCC) 10/2006   left  . Complication of anesthesia    nausea  . Constipation   . COVID-19 virus infection 01/2021  . Family history of breast cancer   . Family history of melanoma   . Family history of rectal cancer   . Generalized anxiety disorder   . Hematuria   . History of kidney stones   . Major depression    treated by Dr. Toi Foster  . Nephrolithiasis    and stone distal R ureter on CT and R hydronephrosis  . Numbness    under l arm (resolved/intermittent)  .  Osteopenia 05/2013   T score minus 1.7  . PONV (postoperative nausea and vomiting)   . Scaphoid fracture 05/01/2022   left wrist-Dr. Annamae Barrett  . TMJ (temporomandibular joint disorder)     Family History  Problem Relation Age of Onset  . Diabetes Father   . Alcohol abuse Father   . Depression Father   . Ulcers Father   . Stomach cancer Father 30       treated with partial gastrectomy. Died of other causes at 43.  . Diabetes Maternal Grandmother   . Hypertension Maternal Grandmother   . Heart attack Maternal Grandmother   . Heart disease Maternal Grandmother   . Hypothyroidism Maternal Grandmother   . Melanoma Maternal Grandmother 60       d.90  . Hypertension Maternal Grandfather   .  Stroke Maternal Grandfather   . Asthma Maternal Grandfather   . Colon polyps Maternal Grandfather   . Heart attack Paternal Grandfather   . Heart disease Paternal Grandfather 91       died of MI  . Arthritis Mother   . Hypothyroidism Mother   . Heart disease Mother 3       pacemaker, afib  . COPD Mother   . Diabetes Brother   . Breast cancer Paternal Aunt 40       d.70s  . Rectal cancer Cousin 40       d.44s Daughter of unaffected maternal uncle.    Social History   Socioeconomic History  . Marital status: Married    Spouse name: Not on file  . Number of children: 4  . Years of education: Not on file  . Highest education level: Not on file  Occupational History  . 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 Smoking status: Never  . Smokeless tobacco: Never  Vaping Use  . Vaping status: Never Used  Substance and Sexual Activity  . Alcohol use: Yes    Alcohol/week: 1.0 standard drink of alcohol    Types: 1 Glasses of wine per week    Comment: 1-2 glass/week  . Drug use: No  . Sexual activity: Yes    Partners: Male    Birth control/protection: Surgical    Comment: HYST  Other Topics Concern  . Not on file  Social History Narrative   Lives with her husband, 2  cats.  She has boy/girl twins, and a daughter who graduated from [employer] and went to medical school at [employer], [place] resident in [place].   Christain Courser is married, has 2 granddaughters.   Son (35 yo) alcoholic; was sent to rehab, and in sober living environment in [place] 03/2018. He is now at [employer], will graduate after internship this summer.   Daughter Sharlyne Deans) graduated [employer] (for film), worked as Programmer, multimedia in [place]. Wants to work in Copywriter, advertising, Catering manager), plans to go to grad school for biology.      Helps care for her mother (who now lives nearby); also caring for in-laws (MIL with BKA, diabetic, FIL passed away 12-31-2020)   Works full-time      Updated 08/2023   Social Drivers of Corporate investment banker Strain: Not on [employer] Insecurity: Not on file  Transportation Needs: Not on file  Physical Activity: Not on file  Stress: Not on file  Social Connections: Not on file  Intimate Partner Violence: Not on file    Past Medical History, Surgical history, Social history, and Family history were reviewed and updated as appropriate.   Please see review of systems for further details on the patient's review from today.   Enjoys to granddaughters 73 and 80 years old  Objective:   Physical Exam:  There were no vitals taken for this visit.  Physical Exam Constitutional:      General: She is not in acute distress.    Appearance: She is well-developed.  Musculoskeletal:        General: No deformity.  Neurological:     Mental Status: She is alert and oriented to person, place, and time.     Motor: No tremor.     Coordination: Coordination normal.     Gait: Gait normal.  Psychiatric:        Attention and Perception: Attention and perception normal.  Mood and Affect: Mood is not anxious or depressed. Affect is not labile, blunt, angry or inappropriate.        Speech: Speech normal.        Behavior: Behavior normal.        Thought Content:  Thought content normal. Thought content is not delusional. Thought content does not include homicidal or suicidal ideation. Thought content does not include suicidal plan.        Cognition and Memory: Cognition normal.        Judgment: Judgment normal.     Comments: Insight intact. No auditory or visual hallucinations. No delusions.  No longer flat.    Lab Review:     Component Value Date/Time   NA 143 08/22/2023 0944   NA 141 02/02/2015 0805   K 4.8 08/22/2023 0944   K 4.1 02/02/2015 0805   CL 104 08/22/2023 0944   CO2 25 08/22/2023 0944   CO2 26 02/02/2015 0805   GLUCOSE 98 08/22/2023 0944   GLUCOSE 118 (H) 04/19/2020 1849   GLUCOSE 80 02/02/2015 0805   BUN 18 08/22/2023 0944   BUN 20.3 02/02/2015 0805   CREATININE 0.65 08/22/2023 0944   CREATININE 0.7 02/02/2015 0805   CALCIUM 9.6 08/22/2023 0944   CALCIUM 10.6 (H) 02/02/2015 0805   PROT 6.5 08/22/2023 0944   PROT 6.9 02/02/2015 0805   ALBUMIN 4.4 08/22/2023 0944   ALBUMIN 4.0 02/02/2015 0805   AST 24 08/22/2023 0944   AST 26 02/02/2015 0805   ALT 24 08/22/2023 0944   ALT 28 02/02/2015 0805   ALKPHOS 73 08/22/2023 0944   ALKPHOS 83 02/02/2015 0805   BILITOT 0.2 08/22/2023 0944   BILITOT 0.38 02/02/2015 0805   GFRNONAA 83 08/03/2020 1131   GFRAA 95 08/03/2020 1131       Component Value Date/Time   WBC 4.5 08/22/2023 0944   WBC 5.2 04/19/2020 1849   RBC 4.34 08/22/2023 0944   RBC 4.16 04/19/2020 1849   HGB 14.2 08/22/2023 0944   HGB 13.8 02/02/2015 0805   HCT 41.8 08/22/2023 0944   HCT 41.4 02/02/2015 0805   PLT 296 08/22/2023 0944   MCV 96 08/22/2023 0944   MCV 96.1 02/02/2015 0805   MCH 32.7 08/22/2023 0944   MCH 32.5 04/19/2020 1849   MCHC 34.0 08/22/2023 0944   MCHC 34.8 04/19/2020 1849   RDW 12.2 08/22/2023 0944   RDW 13.5 02/02/2015 0805   LYMPHSABS 1.6 08/22/2023 0944   LYMPHSABS 1.7 02/02/2015 0805   MONOABS 0.3 04/25/2016 1020   MONOABS 0.4 02/02/2015 0805   EOSABS 0.1 08/22/2023 0944    BASOSABS 0.0 08/22/2023 0944   BASOSABS 0.0 02/02/2015 0805    No results found for: "POCLITH", "LITHIUM"   No results found for: "PHENYTOIN", "PHENOBARB", "VALPROATE", "CBMZ"   .res Assessment: Plan:    Mild recurrent major depression (HCC) - Plan: buPROPion  (WELLBUTRIN  XL) 300 MG 24 hr tablet  Generalized anxiety disorder - Plan: traZODone  (DESYREL ) 50 MG tablet  Panic disorder with agoraphobia - Plan: traZODone  (DESYREL ) 50 MG tablet  Insomnia due to mental condition - Plan: traZODone  (DESYREL ) 50 MG tablet  Attention deficit hyperactivity disorder (ADHD), predominantly inattentive type - Plan: buPROPion  (WELLBUTRIN  XL) 300 MG 24 hr tablet  Major depressive disorder, recurrent episode, moderate (HCC) - Plan: traZODone  (DESYREL ) 50 MG tablet  Done and symptoms have been largely controlled through the combination of increasing the trazodone , therapy finished.  She is satisfied with her current treatment.  Continue NAC 1200  mg daily for ADD off label. Other options if needed.  Better with Wellbutrin  which could also help focus and energy up to 300 mg AM Continue trazodone  50 mg HS.  She appears to be fairly medication sensitive but is tolerating these meds well.  The NAC is being used off label for ADD and cognitive reasons.  She does not want to take stimulants.  Follow up 1 year  Nori Beat, MD, DFAPA   Future Appointments  Date Time Provider Department Center  09/07/2024  8:45 AM Roosvelt Colla, MD [facility]  12/08/2024  9:00 AM Cottle, Kennedy Peabody., MD [facility] None    No orders of the defined types were placed in this encounter.     -------------------------------

## 2024-01-01 ENCOUNTER — Other Ambulatory Visit: Payer: Self-pay | Admitting: Student

## 2024-01-01 DIAGNOSIS — R195 Other fecal abnormalities: Secondary | ICD-10-CM | POA: Diagnosis not present

## 2024-01-01 DIAGNOSIS — R109 Unspecified abdominal pain: Secondary | ICD-10-CM | POA: Diagnosis not present

## 2024-01-01 DIAGNOSIS — K5900 Constipation, unspecified: Secondary | ICD-10-CM | POA: Diagnosis not present

## 2024-01-01 DIAGNOSIS — R140 Abdominal distension (gaseous): Secondary | ICD-10-CM | POA: Diagnosis not present

## 2024-01-02 DIAGNOSIS — R195 Other fecal abnormalities: Secondary | ICD-10-CM | POA: Diagnosis not present

## 2024-01-06 ENCOUNTER — Ambulatory Visit
Admission: RE | Admit: 2024-01-06 | Discharge: 2024-01-06 | Disposition: A | Discharge status: Home / Self Care | Source: Ambulatory Visit | Attending: Student | Admitting: Student

## 2024-01-06 DIAGNOSIS — R109 Unspecified abdominal pain: Secondary | ICD-10-CM | POA: Diagnosis not present

## 2024-01-07 ENCOUNTER — Other Ambulatory Visit: Payer: Self-pay | Admitting: Psychiatry

## 2024-01-07 DIAGNOSIS — F330 Major depressive disorder, recurrent, mild: Secondary | ICD-10-CM

## 2024-01-07 DIAGNOSIS — F900 Attention-deficit hyperactivity disorder, predominantly inattentive type: Secondary | ICD-10-CM

## 2024-01-20 ENCOUNTER — Encounter: Payer: Self-pay | Admitting: Family Medicine

## 2024-01-20 ENCOUNTER — Ambulatory Visit: Admitting: Family Medicine

## 2024-01-20 ENCOUNTER — Ambulatory Visit: Payer: Self-pay

## 2024-01-20 VITALS — BP 120/70 | HR 69 | Temp 98.7°F | Wt 115.6 lb

## 2024-01-20 DIAGNOSIS — N3001 Acute cystitis with hematuria: Secondary | ICD-10-CM

## 2024-01-20 DIAGNOSIS — K589 Irritable bowel syndrome without diarrhea: Secondary | ICD-10-CM

## 2024-01-20 LAB — POCT URINE DIPSTICK
Bilirubin, UA: NEGATIVE
Glucose, UA: NEGATIVE mg/dL
Ketones, POC UA: NEGATIVE mg/dL
Nitrite, UA: NEGATIVE
POC PROTEIN,UA: NEGATIVE
Spec Grav, UA: <=1.005 — AB (ref 1.010–1.025)
Urobilinogen, UA: 0.2 U/dL
pH, UA: 6.5 (ref 5.0–8.0)

## 2024-01-20 MED ORDER — NITROFURANTOIN MONOHYD MACRO 100 MG PO CAPS: 100.0000 mg | ORAL_CAPSULE | Freq: Two times a day (BID) | ORAL | 0 refills | Status: DC

## 2024-01-20 NOTE — Progress Notes (Signed)
 Chief Complaint  Patient presents with   Annual Exam    UTI Pain. Back was bothering her yesterday. Burning when using the bathroom. Blood in urine.    Patient presents with c/o 1 day of dysuria, urgency, frequency and hematuria.  Has noted small clots in the urine. She has upcoming travel to [place]  (leaves in 2 days).   She also endorses some back pain--started yesterday, across the lower back (not at flanks). She thought it was related to yardwork over the weekend. In the middle of the night last night she woke up to void--didn't feel great, but no blood.  Throughout the morning she noted blood in the urine, urgency and frequency.  I feel crummy  She has been working a lot and helping mom a lot, possibly is holding urine longer than she should  Denies frequent UTI's, none in a few years.  She had abdominal US  done 01/06/24, and kidneys appeared normal (along with rest of US ). She had seen [facility] for abdominal pain, cramping and green stool. Hydrogen breath test ordered, scheduled for August. She had normal stool studies, labs, including evaluation for pancreatic insufficiency. She has been on a strict IBS diet, and stools are no longer green, and stomach is doing a bit better. This is similar to 2 years ago, but hasn't lost as much weight.   PMH, PSH, SH reviewed  Outpatient Encounter Medications as of 01/20/2024  Medication Sig Note   Bacillus Coagulans-Inulin (ALIGN PREBIOTIC-PROBIOTIC PO) Take 1 capsule by mouth daily. 08/22/2023: As needed   buPROPion  (WELLBUTRIN  XL) 300 MG 24 hr tablet TAKE 1 TABLET(300 MG) BY MOUTH DAILY    cholecalciferol (VITAMIN D3) 25 MCG (1000 UNIT) tablet Take 1,000 Units by mouth daily.    denosumab  (PROLIA ) 60 MG/ML SOSY injection Inject 60 mg into the skin every 6 (six) months. 05/16/2023: Last dose 03/20/23   fluticasone  (FLONASE ) 50 MCG/ACT nasal spray SHAKE LIQUID AND USE 2 SPRAYS IN EACH NOSTRIL DAILY    hydrochlorothiazide (HYDRODIURIL) 12.5 MG  tablet Take 12.5 mg by mouth daily.    L-Methylfolate-Algae (DEPLIN 15) 15-90.314 MG CAPS Take 1 tablet by mouth daily.    loratadine (CLARITIN) 10 MG tablet Take 10 mg by mouth daily.    Multiple Vitamins-Minerals (MULTIVITAMIN WITH MINERALS) tablet Take 1 tablet by mouth daily. 08/22/2023: As needed   omeprazole  (PRILOSEC) 40 MG capsule Take 1 capsule (40 mg total) by mouth 2 (two) times daily before a meal. 08/22/2023: As needed   polyethylene glycol (MIRALAX / GLYCOLAX) packet Take 17 g by mouth daily as needed. 08/22/2023: daily   thyroid  (ARMOUR) 60 MG tablet Take 60 mg by mouth daily before breakfast. 08/22/2023: All days except Sat   traZODone  (DESYREL ) 50 MG tablet Take 1 tablet (50 mg total) by mouth at bedtime.    amoxicillin -clavulanate (AUGMENTIN ) 875-125 MG tablet Take 1 tablet by mouth 2 (two) times daily. (Patient not taking: Reported on 01/20/2024) 08/22/2023: Has one more day left.   L-Methylfolate (DEPLIN FC) 15 MG CAPS TAKE 1 CAPSULE BY MOUTH DAILY (Patient not taking: Reported on 01/20/2024)    No facility-administered encounter medications on file as of 01/20/2024.   Allergies  Allergen Reactions   Poison Ivy Extract Rash   Prednisone Palpitations   Morphine  Itching    REACTION: itching REACTION: itching    ROS:  no f/c, n/v.  She has been alternating between constipation and diarrhea, nothing out of the normal. Denies vaginal discharge or odor, just slight itch. Urinary  symptoms per HPI.   PHYSICAL EXAM:  BP 120/70   Pulse 69   Wt 115 lb 9.6 oz (52.4 kg)   SpO2 96%   BMI 20.16 kg/m   Wt Readings from Last 3 Encounters:  01/20/24 115 lb 9.6 oz (52.4 kg)  08/22/23 115 lb (52.2 kg)  08/13/23 115 lb 12.8 oz (52.5 kg)   Pleasant, well-appearing female, in no distress Suprapubic tenderness Mildly tender in epigastrium.  No spinal or CVA tenderness.  Mildly tender at lumbar paraspinous muscles.  Urine dip: SG 1.005, large blood, large leuks, otherwise  negative.   ASSESSMENT/PLAN:  Acute cystitis with hematuria - culture sent; start macrobid . Contact us  if sx persist/worsen. Will check MyChart for culture results while in [place]  - Plan: Urine Culture, nitrofurantoin , macrocrystal-monohydrate, (MACROBID ) 100 MG capsule, POCT URINE DIPSTICK  Irritable bowel syndrome, unspecified type - recent flare, improving

## 2024-01-20 NOTE — Telephone Encounter (Signed)
 FYI Only or Action Required?: FYI only for provider.  Patient was last seen in primary care on 08/22/2023 by Randol Dawes, MD. Called Nurse Triage reporting Dysuria and Hematuria. Symptoms began today. Interventions attempted: Nothing. Symptoms are: rapidly worsening.  Triage Disposition: See HCP Within 4 Hours (Or PCP Triage)  Patient/caregiver understands and will follow disposition?: Yes - transferred call to CAL               Copied from CRM [id number]. Topic: Clinical - Red Word Triage >> Jan 20, 2024  8:02 AM Treva T wrote: [facility] that prompted transfer to Nurse Triage: Patient calling states she is having pain with urination, and blood. Reason for Disposition  [1] Pain or burning with passing urine AND [2] side (flank) or back pain present  Answer Assessment - Initial Assessment Questions 1. COLOR of URINE: Describe the color of the urine.  (e.g., tea-colored, pink, red, bloody) Do you have blood clots in your urine? (e.g., none, pea, grape, small coin)     Pink small clots 2. ONSET: When did the bleeding start?      Overnight 3. EPISODES: How many times has there been blood in the urine? or How many times today?     3-4 4. PAIN with URINATION: Is there any pain with passing your urine? If Yes, ask: How bad is the pain?  (Scale 1-10; or mild, moderate, severe)    - MILD: Complains slightly about urination hurting.    - MODERATE: Interferes with normal activities.      - SEVERE: Excruciating, unwilling or unable to urinate because of the pain.      Moderate 5. FEVER: Do you have a fever? If Yes, ask: What is your temperature, how was it measured, and when did it start?     no 6. ASSOCIATED SYMPTOMS: Are you passing urine more frequently than usual?     Frequency, urgency, Pain 7. OTHER SYMPTOMS: Do you have any other symptoms? (e.g., back/flank pain, abdomen pain, vomiting)     Back pain  Protocols used: Urine - Blood In-A-AH

## 2024-01-20 NOTE — Patient Instructions (Signed)
 Contact us  if you get a yeast infection that doesn't respond to Monistat.  Let us  know of a pharmacy in [place]  and we can send in Diflucan .  If the nitrofurantoin  is the correct antibiotic for your infection, you should feel better within 24-48 hours.  If not, we will wait on the sensitivities from the culture, and send a new prescription to a pharmacy in [place] .  If you develop fever, flank pain, vomiting, or other more concerning symptoms, please let us  know, and we can change to cipro  right away (not wait for the culture results), and/or you may need to be seen locally (urgent care).

## 2024-01-22 ENCOUNTER — Ambulatory Visit: Payer: Self-pay | Admitting: Family Medicine

## 2024-01-22 LAB — URINE CULTURE

## 2024-02-28 DIAGNOSIS — R140 Abdominal distension (gaseous): Secondary | ICD-10-CM | POA: Diagnosis not present

## 2024-02-28 DIAGNOSIS — R1013 Epigastric pain: Secondary | ICD-10-CM | POA: Diagnosis not present

## 2024-02-28 DIAGNOSIS — K580 Irritable bowel syndrome with diarrhea: Secondary | ICD-10-CM | POA: Diagnosis not present

## 2024-02-28 DIAGNOSIS — K219 Gastro-esophageal reflux disease without esophagitis: Secondary | ICD-10-CM | POA: Diagnosis not present

## 2024-03-04 ENCOUNTER — Ambulatory Visit (INDEPENDENT_AMBULATORY_CARE_PROVIDER_SITE_OTHER): Admitting: Family Medicine

## 2024-03-04 ENCOUNTER — Encounter: Payer: Self-pay | Admitting: Family Medicine

## 2024-03-04 VITALS — BP 98/60 | HR 60 | Temp 99.3°F | Ht 63.5 in | Wt 118.8 lb

## 2024-03-04 DIAGNOSIS — R82994 Hypercalciuria: Secondary | ICD-10-CM | POA: Diagnosis not present

## 2024-03-04 DIAGNOSIS — E876 Hypokalemia: Secondary | ICD-10-CM | POA: Diagnosis not present

## 2024-03-04 DIAGNOSIS — J069 Acute upper respiratory infection, unspecified: Secondary | ICD-10-CM | POA: Diagnosis not present

## 2024-03-04 DIAGNOSIS — K589 Irritable bowel syndrome without diarrhea: Secondary | ICD-10-CM | POA: Diagnosis not present

## 2024-03-04 NOTE — Patient Instructions (Signed)
 ACUTE UPPER RESPIRATORY INFECTION WITH COUGH AND CHEST CONGESTION: You have a persistent cough and chest congestion likely due to a viral infection. -Stop using your current over-the-counter medications. -Start taking Mucinex DM 12-hour tablet twice daily. -Perform sinus rinses with distilled or boiled water twice daily. -Use pseudoephedrine (sudafed found behind the counter, not on the shelf) as needed for congestion, to dry up mucus and help decrease postnasal drainage) -Monitor your symptoms for 24-48 hours. If there is no improvement or if you continue to have yellow mucus, we may consider starting you on azithromycin . -If your cough persists despite taking Mucinex DM, we will prescribe Tessalon  Perles.  EUSTACHIAN TUBE DYSFUNCTION, LEFT EAR: You have mild retraction of your left eardrum due to congestion, but there is no infection. -Continue using your current allergy medications, Flonase  and Claritin. -Consider using a decongestant if your nasal congestion continues.

## 2024-03-04 NOTE — Progress Notes (Signed)
 Chief Complaint  Patient presents with   Cough    Started Sunday 10th with faitigue and scratchy throat. Was in [place]. Mucus is yellow in color, non productive (not much, some coming). Has been taking Daytime/Nightime (did not take today). No fevers during sickness. Has HA from coughing. Did covid on 02/26/24 neg.    Kristen Gamble is a 63 year old female who presents with a persistent cough and chest congestion.  Her symptoms began ten days ago with fatigue and mild head congestion, progressing to a persistent, productive cough with yellow mucus and chest congestion. The cough disrupts sleep. She experiences post-nasal drip but no significant nasal drainage. When she does blow her nose, mucus is clear.  Cough is productive of some thick yellow phlegm, throughout the day. She hasn't been aware of fever, noted today at visit.  She denies DOE, just chest congestion. She gets a little lightheaded with coughing spells.  She used Nyquil day and night, for 4-5 days, then switched to mucinex day and night. She is taking the daytime med 2x/d, nighttime med once a night. She is drinking plenty of water. She takes Flonase  and claritin daily.  She has been having GI issues flaring--currently on low FODMAP diet for recurrent bloating.  Planning a breath test and treatment with Xifaxan (awaiting insurance issues).  She is now taking hydrochlorothiazide, prescribed by Dr. Balan for hypercalciuria. She reports that this improved on recheck. Her potassium was low, but recheck was better after increased dietary intake of potassium-rich foods.   PMH, PSH, SH reviewed  Outpatient Encounter Medications as of 03/04/2024  Medication Sig Note   Bacillus Coagulans-Inulin (ALIGN PREBIOTIC-PROBIOTIC PO) Take 1 capsule by mouth daily. 08/22/2023: As needed   buPROPion  (WELLBUTRIN  XL) 300 MG 24 hr tablet TAKE 1 TABLET(300 MG) BY MOUTH DAILY    cholecalciferol (VITAMIN D3) 25 MCG (1000 UNIT) tablet Take 1,000 Units by mouth daily.     denosumab  (PROLIA ) 60 MG/ML SOSY injection Inject 60 mg into the skin every 6 (six) months. 05/16/2023: Last dose 03/20/23   fluticasone  (FLONASE ) 50 MCG/ACT nasal spray SHAKE LIQUID AND USE 2 SPRAYS IN EACH NOSTRIL DAILY    hydrochlorothiazide (HYDRODIURIL) 12.5 MG tablet Take 12.5 mg by mouth daily. 03/04/2024: Per Dr. Tommas for hypercalciuria   L-Methylfolate-Algae (DEPLIN 15) 15-90.314 MG CAPS Take 1 tablet by mouth daily.    loratadine (CLARITIN) 10 MG tablet Take 10 mg by mouth daily.    Multiple Vitamins-Minerals (MULTIVITAMIN WITH MINERALS) tablet Take 1 tablet by mouth daily. 08/22/2023: As needed   omeprazole  (PRILOSEC) 40 MG capsule Take 1 capsule (40 mg total) by mouth 2 (two) times daily before a meal. 03/04/2024: As needed   Phenyleph-Diphenhyd-GG-APAP (MUCINEX SINUS-MAX DAY/NIGHT PO) Take 2 tablets by mouth as needed. 03/04/2024: Took the nighttime last night   polyethylene glycol (MIRALAX / GLYCOLAX) packet Take 17 g by mouth daily as needed. 03/04/2024: daily   thyroid  (ARMOUR) 60 MG tablet Take 60 mg by mouth daily before breakfast. 08/22/2023: All days except Sat   traZODone  (DESYREL ) 50 MG tablet Take 1 tablet (50 mg total) by mouth at bedtime.    [DISCONTINUED] amoxicillin -clavulanate (AUGMENTIN ) 875-125 MG tablet Take 1 tablet by mouth 2 (two) times daily. (Patient not taking: Reported on 01/20/2024) 08/22/2023: Has one more day left.   [DISCONTINUED] L-Methylfolate (DEPLIN FC) 15 MG CAPS TAKE 1 CAPSULE BY MOUTH DAILY (Patient not taking: Reported on 01/20/2024)    [DISCONTINUED] nitrofurantoin , macrocrystal-monohydrate, (MACROBID ) 100 MG capsule Take 1 capsule (  100 mg total) by mouth 2 (two) times daily.    No facility-administered encounter medications on file as of 03/04/2024.   Allergies  Allergen Reactions   Poison Ivy Extract Rash   Prednisone Palpitations   Morphine  Itching    REACTION: itching REACTION: itching    ROS: no F/C, no sinus headaches. HA after coughing spell.  ST  from coughing, LH after coughing only.  No dizziness, CP. Feels pressure on chest.  Denies DOE. Constipation is well controlled with miralax.  Has been having bloating, diarrhea, per HPI.   PHYSICAL EXAM:  BP 98/60   Pulse 60   Temp 99.3 F (37.4 C) (Tympanic)   Ht 5' 3.5 (1.613 m)   Wt 118 lb 12.8 oz (53.9 kg)   BMI 20.71 kg/m   Pleasant female, with frequent dry cough throughout visit, and some throat clearing. She is speaking comfortably, in no distress HEENT: conjunctiva and sclera are clear, EOMI, OP clear. TM's and EAC's normal, L TM is retracted, right is normal. Moderate edema of nasal mucosa with stringy mucus in the left nasal passage. Sinuses are nontender. Neck: no lymphadenopathy or mass Heart: regular rate and rhythm CHEST: Lungs clear to auscultation bilaterally. Extremities: no edema Skin: normal turgor, no rash Psych: normal mood, affect, hygiene and grooming Neuro: alert and oriented, cranial nerves grossly intact, normal gait.   ASSESSMENT/PLAN:  Viral upper respiratory illness - supportive measures reviewed. s/sx bacterial infection reviewed. Contact us  for z-pak and/or tessalon  perles if current recs aren't helping  Irritable bowel syndrome, unspecified type - on low FODMAP diet and awaiting breath test per GI. Awaiting auth for xifaxan  Hypercalciuria - on HCTZ per Dr. Tommas. pt reported improvement noted  Hypokalemia - related ot use of hctz. Reviewed potassium-rich foods. BP is low, discussed effect of HCTZ on BP, hydration important   Mucinex DM 12 hour twice daily, stop current OTC meds. Start sinus rinses BID Contact us  for Tessalon  Rx if needed for cough Contact us  for Rx for Z-pak if symptoms persist or worsen, with ongoing purulent phlegm production. Asked her to try the above measures for 24-48 hours, and send MyChart message if not improving.  Acute upper respiratory infection with cough and chest congestion Persistent cough and chest  congestion for 10 days, likely viral etiology. No pneumonia. Consider bacterial infection if symptoms persist. - Discontinue current over-the-counter medications. - Start Mucinex DM 12-hour tablet twice daily. - Perform sinus rinses with distilled or boiled water twice daily. - Monitor symptoms for 24-48 hours; consider azithromycin  if no improvement or persistent yellow sputum. - Prescribe Tessalon  Perles if cough persists despite Mucinex DM.  Eustachian tube dysfunction, left ear Mild retraction of the left tympanic membrane due to congestion. No infection. - Continue current allergy medications (Flonase  and Claritin). - Consider decongestant if nasal congestion persists. Discussed using pseudophedrine, not phenylephrine   Irritable bowel syndrome with diarrhea and bloating Symptoms include bloating and alternating diarrhea and constipation. Awaiting breath test, and possible course of xifaxan for SIBO through GI. Currently on low FODMAP diet.

## 2024-03-06 ENCOUNTER — Telehealth: Payer: Self-pay | Admitting: Psychiatry

## 2024-03-06 ENCOUNTER — Encounter: Payer: Self-pay | Admitting: Family Medicine

## 2024-03-06 MED ORDER — AZITHROMYCIN 250 MG PO TABS: ORAL_TABLET | ORAL | 0 refills | Status: AC

## 2024-03-06 MED ORDER — BENZONATATE 200 MG PO CAPS: 200.0000 mg | ORAL_CAPSULE | Freq: Three times a day (TID) | ORAL | 0 refills | Status: AC | PRN

## 2024-03-06 MED ORDER — DEPLIN 15 15-90.314 MG PO CAPS: 1.0000 | ORAL_CAPSULE | Freq: Every day | ORAL | 0 refills | Status: DC

## 2024-03-06 NOTE — Telephone Encounter (Signed)
 Sent!

## 2024-03-06 NOTE — Telephone Encounter (Signed)
 Next visit is 12/08/24. Requesting refill on Deplan from [facility]. She has 7-10 days left of Deplan. Pharmacy is:  [facility] [place] - [place], [place] - [id number]   Phone: [phone]  Fax: [phone]

## 2024-04-17 DIAGNOSIS — M8580 Other specified disorders of bone density and structure, unspecified site: Secondary | ICD-10-CM | POA: Diagnosis not present

## 2024-04-17 DIAGNOSIS — E210 Primary hyperparathyroidism: Secondary | ICD-10-CM | POA: Diagnosis not present

## 2024-04-17 DIAGNOSIS — E039 Hypothyroidism, unspecified: Secondary | ICD-10-CM | POA: Diagnosis not present

## 2024-04-24 DIAGNOSIS — M810 Age-related osteoporosis without current pathological fracture: Secondary | ICD-10-CM | POA: Diagnosis not present

## 2024-05-18 DIAGNOSIS — Z23 Encounter for immunization: Secondary | ICD-10-CM | POA: Diagnosis not present

## 2024-05-18 DIAGNOSIS — M810 Age-related osteoporosis without current pathological fracture: Secondary | ICD-10-CM | POA: Diagnosis not present

## 2024-05-18 DIAGNOSIS — E039 Hypothyroidism, unspecified: Secondary | ICD-10-CM | POA: Diagnosis not present

## 2024-05-18 DIAGNOSIS — N200 Calculus of kidney: Secondary | ICD-10-CM | POA: Diagnosis not present

## 2024-05-18 DIAGNOSIS — Z9889 Other specified postprocedural states: Secondary | ICD-10-CM | POA: Diagnosis not present

## 2024-05-22 DIAGNOSIS — K5900 Constipation, unspecified: Secondary | ICD-10-CM | POA: Diagnosis not present

## 2024-05-22 DIAGNOSIS — R197 Diarrhea, unspecified: Secondary | ICD-10-CM | POA: Diagnosis not present

## 2024-05-22 DIAGNOSIS — R140 Abdominal distension (gaseous): Secondary | ICD-10-CM | POA: Diagnosis not present

## 2024-05-22 DIAGNOSIS — K638219 Small intestinal bacterial overgrowth, unspecified: Secondary | ICD-10-CM | POA: Diagnosis not present

## 2024-05-22 DIAGNOSIS — R12 Heartburn: Secondary | ICD-10-CM | POA: Diagnosis not present

## 2024-05-26 ENCOUNTER — Other Ambulatory Visit: Payer: Self-pay | Admitting: Psychiatry

## 2024-05-26 DIAGNOSIS — F5105 Insomnia due to other mental disorder: Secondary | ICD-10-CM

## 2024-05-26 DIAGNOSIS — F411 Generalized anxiety disorder: Secondary | ICD-10-CM

## 2024-05-26 DIAGNOSIS — F4001 Agoraphobia with panic disorder: Secondary | ICD-10-CM

## 2024-05-26 DIAGNOSIS — F331 Major depressive disorder, recurrent, moderate: Secondary | ICD-10-CM

## 2024-06-05 DIAGNOSIS — R1030 Lower abdominal pain, unspecified: Secondary | ICD-10-CM | POA: Diagnosis not present

## 2024-06-05 DIAGNOSIS — R140 Abdominal distension (gaseous): Secondary | ICD-10-CM | POA: Diagnosis not present

## 2024-06-05 DIAGNOSIS — K219 Gastro-esophageal reflux disease without esophagitis: Secondary | ICD-10-CM | POA: Diagnosis not present

## 2024-06-05 DIAGNOSIS — K580 Irritable bowel syndrome with diarrhea: Secondary | ICD-10-CM | POA: Diagnosis not present

## 2024-06-08 DIAGNOSIS — M25511 Pain in right shoulder: Secondary | ICD-10-CM | POA: Diagnosis not present

## 2024-06-10 ENCOUNTER — Telehealth: Payer: Self-pay | Admitting: Psychiatry

## 2024-06-10 MED ORDER — DEPLIN 15 15-90.314 MG PO CAPS: 1.0000 | ORAL_CAPSULE | Freq: Every day | ORAL | 1 refills | Status: AC

## 2024-06-10 NOTE — Telephone Encounter (Signed)
 Sent RF

## 2024-06-10 NOTE — Telephone Encounter (Signed)
 Pt needs rf of Deplin. She is almost out.      [facility]

## 2024-06-25 DIAGNOSIS — M25511 Pain in right shoulder: Secondary | ICD-10-CM | POA: Diagnosis not present

## 2024-06-29 ENCOUNTER — Other Ambulatory Visit: Payer: Self-pay | Admitting: Psychiatry

## 2024-06-29 DIAGNOSIS — F900 Attention-deficit hyperactivity disorder, predominantly inattentive type: Secondary | ICD-10-CM

## 2024-06-29 DIAGNOSIS — F330 Major depressive disorder, recurrent, mild: Secondary | ICD-10-CM

## 2024-06-30 DIAGNOSIS — M25511 Pain in right shoulder: Secondary | ICD-10-CM | POA: Diagnosis not present

## 2024-06-30 DIAGNOSIS — M542 Cervicalgia: Secondary | ICD-10-CM | POA: Diagnosis not present

## 2024-09-07 ENCOUNTER — Encounter: Payer: BC Managed Care – PPO | Admitting: Family Medicine

## 2024-12-08 ENCOUNTER — Ambulatory Visit: Admitting: Psychiatry
